# Patient Record
Sex: Male | Born: 1940 | Race: White | Hispanic: No | Marital: Married | Smoking: Never smoker
Health system: Southern US, Community
[De-identification: ages and names within clinical notes are randomized; demographics above are authoritative.]

## PROBLEM LIST (undated history)

## (undated) DIAGNOSIS — N400 Enlarged prostate without lower urinary tract symptoms: Secondary | ICD-10-CM

## (undated) DIAGNOSIS — M5126 Other intervertebral disc displacement, lumbar region: Secondary | ICD-10-CM

## (undated) DIAGNOSIS — G238 Other specified degenerative diseases of basal ganglia: Secondary | ICD-10-CM

## (undated) DIAGNOSIS — E785 Hyperlipidemia, unspecified: Secondary | ICD-10-CM

## (undated) DIAGNOSIS — K219 Gastro-esophageal reflux disease without esophagitis: Secondary | ICD-10-CM

## (undated) DIAGNOSIS — I10 Essential (primary) hypertension: Secondary | ICD-10-CM

## (undated) DIAGNOSIS — G903 Multi-system degeneration of the autonomic nervous system: Secondary | ICD-10-CM

## (undated) DIAGNOSIS — E039 Hypothyroidism, unspecified: Secondary | ICD-10-CM

## (undated) DIAGNOSIS — K5790 Diverticulosis of intestine, part unspecified, without perforation or abscess without bleeding: Secondary | ICD-10-CM

## (undated) DIAGNOSIS — K449 Diaphragmatic hernia without obstruction or gangrene: Secondary | ICD-10-CM

## (undated) HISTORY — DX: Other intervertebral disc displacement, lumbar region: M51.26

## (undated) HISTORY — DX: Gastro-esophageal reflux disease without esophagitis: K21.9

## (undated) HISTORY — DX: Hypothyroidism, unspecified: E03.9

## (undated) HISTORY — DX: Multi-system degeneration of the autonomic nervous system: G90.3

## (undated) HISTORY — DX: Other specified degenerative diseases of basal ganglia: G23.8

## (undated) HISTORY — DX: Hyperlipidemia, unspecified: E78.5

## (undated) HISTORY — DX: Benign prostatic hyperplasia without lower urinary tract symptoms: N40.0

## (undated) HISTORY — DX: Diaphragmatic hernia without obstruction or gangrene: K44.9

## (undated) HISTORY — DX: Diverticulosis of intestine, part unspecified, without perforation or abscess without bleeding: K57.90

## (undated) HISTORY — DX: Essential (primary) hypertension: I10

---
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
## 1998-12-17 ENCOUNTER — Encounter: Admission: RE | Admit: 1998-12-17 | Discharge: 1999-03-17 | Payer: Self-pay | Admitting: Endocrinology

## 2002-03-24 ENCOUNTER — Ambulatory Visit (HOSPITAL_COMMUNITY): Admission: RE | Admit: 2002-03-24 | Discharge: 2002-03-24 | Payer: Self-pay | Admitting: *Deleted

## 2004-02-05 ENCOUNTER — Encounter: Admission: RE | Admit: 2004-02-05 | Discharge: 2004-02-05 | Payer: Self-pay | Admitting: Internal Medicine

## 2007-06-20 HISTORY — PX: KNEE SURGERY: SHX244

## 2007-09-01 ENCOUNTER — Encounter: Admission: RE | Admit: 2007-09-01 | Discharge: 2007-09-01 | Payer: Self-pay | Admitting: Internal Medicine

## 2008-02-28 ENCOUNTER — Encounter: Admission: RE | Admit: 2008-02-28 | Discharge: 2008-02-28 | Payer: Self-pay | Admitting: Internal Medicine

## 2008-03-05 ENCOUNTER — Encounter: Admission: RE | Admit: 2008-03-05 | Discharge: 2008-03-05 | Payer: Self-pay | Admitting: Internal Medicine

## 2008-06-19 HISTORY — PX: SHOULDER SURGERY: SHX246

## 2009-10-21 ENCOUNTER — Encounter: Admission: RE | Admit: 2009-10-21 | Discharge: 2009-10-21 | Payer: Self-pay | Admitting: Orthopedic Surgery

## 2010-07-11 ENCOUNTER — Encounter: Payer: Self-pay | Admitting: Internal Medicine

## 2010-11-04 NOTE — Op Note (Signed)
   NAME:  Justin, Gomez                         ACCOUNT NO.:  [id number]   MEDICAL RECORD NO.:  [id number]                   PATIENT TYPE:  AMB   LOCATION:  ENDO                                 FACILITY:  [facility]   PHYSICIAN:  Georgiana Spinner, M.D.                 DATE OF BIRTH:  01/27/1941   DATE OF PROCEDURE:  DATE OF DISCHARGE:                                 OPERATIVE REPORT   PROCEDURE:  Colonoscopy.   INDICATIONS FOR PROCEDURE:  Hemoccult positivity, colon cancer screening.   ANESTHESIA:  Demerol 80, Versed 8.5 mg.   DESCRIPTION OF PROCEDURE:  With the patient mildly sedated in the left  lateral decubitus position, a rectal examination was performed which  revealed trace positive material, the prostate was not felt. From this  point, the colonoscope was inserted in the rectum and passed under direct  vision to the cecum identified by the ileocecal valve and appendiceal  orifice both of which were photographed. From this point, the colonoscope  was slowly withdrawn taking circumferential views of the entire colonic  mucosa stopping to photograph diverticulosis seen along the way in the  sigmoid colon until we reached the rectum which appeared normal on direct  and retroflexed view. The endoscope was straightened and withdrawn. The  patient's vital signs and pulse oximeter remained stable. The patient  tolerated the procedure well without apparent complications.   FINDINGS:  Diverticulosis of sigmoid colon, otherwise, unremarkable exam.   PLAN:  Possibly repeat examination in about five years.                                                 Georgiana Spinner, M.D.    GMO/MEDQ  D:  03/24/2002  T:  03/24/2002  Job:  [id number]

## 2011-04-21 ENCOUNTER — Other Ambulatory Visit: Payer: Self-pay | Admitting: Internal Medicine

## 2011-04-21 DIAGNOSIS — R1310 Dysphagia, unspecified: Secondary | ICD-10-CM

## 2011-04-26 ENCOUNTER — Other Ambulatory Visit: Payer: Self-pay

## 2011-05-04 ENCOUNTER — Ambulatory Visit
Admission: RE | Admit: 2011-05-04 | Discharge: 2011-05-04 | Disposition: A | Discharge status: Home / Self Care | Payer: No Typology Code available for payment source | Source: Ambulatory Visit | Attending: Internal Medicine | Admitting: Internal Medicine

## 2011-05-04 DIAGNOSIS — R1310 Dysphagia, unspecified: Secondary | ICD-10-CM

## 2011-06-28 ENCOUNTER — Encounter: Payer: Self-pay | Admitting: Gastroenterology

## 2011-07-11 ENCOUNTER — Ambulatory Visit (INDEPENDENT_AMBULATORY_CARE_PROVIDER_SITE_OTHER): Payer: No Typology Code available for payment source | Admitting: Gastroenterology

## 2011-07-11 ENCOUNTER — Encounter: Payer: Self-pay | Admitting: Gastroenterology

## 2011-07-11 VITALS — BP 120/62 | HR 76 | Ht 72.0 in | Wt 215.0 lb

## 2011-07-11 DIAGNOSIS — R1319 Other dysphagia: Secondary | ICD-10-CM

## 2011-07-11 DIAGNOSIS — R933 Abnormal findings on diagnostic imaging of other parts of digestive tract: Secondary | ICD-10-CM

## 2011-07-11 NOTE — Patient Instructions (Signed)
You have been scheduled for a Upper Endoscopy with possible dilitation. See separate instructions. cc: Juline Patch, MD

## 2011-07-11 NOTE — Progress Notes (Signed)
History of Present Illness: This is a 71 year old male with a six-month history of intermittent solid food dysphagia and regurgitation. His symptoms have not progressed in severity. He underwent an upper GI series on 05/04/2011 showing a small sliding hiatal hernia as well as anterior osteophytes compressing the esophagus at the T1-T2 level. Denies weight loss, abdominal pain, constipation, diarrhea, change in stool caliber, melena, hematochezia, nausea, vomiting, chest pain.  No Known Allergies Outpatient Prescriptions Prior to Visit  Medication Sig Dispense Refill  . aspirin 81 MG tablet Take 81 mg by mouth daily.       Marland Kitchen dutasteride (AVODART) 0.5 MG capsule Take 0.5 mg by mouth daily.      Marland Kitchen levothyroxine (SYNTHROID, LEVOTHROID) 75 MCG tablet Take 75 mcg by mouth daily.      Marland Kitchen lisinopril (PRINIVIL,ZESTRIL) 10 MG tablet Take 10 mg by mouth daily.      . Saxagliptin-Metformin (KOMBIGLYZE XR) 5-500 MG TB24 Take 1 tablet by mouth 2 (two) times daily.        Past Medical History  Diagnosis Date  . Diabetes mellitus   . Hyperlipidemia   . Hypertension   . BPH (benign prostatic hypertrophy)   . Hypothyroidism   . Lumbar herniated disc     L4  . Diverticulosis   . Hiatal hernia   . GERD (gastroesophageal reflux disease)    Past Surgical History  Procedure Date  . Appendectomy 1956  . Knee surgery 2009    right  . Shoulder surgery 2010    left   History   Social History  . Marital Status: Married    Spouse Name: N/A    Number of Children: 3  . Years of Education: N/A   Occupational History  . business executive    Social History Main Topics  . Smoking status: Never Smoker   . Smokeless tobacco: Never Used  . Alcohol Use: Yes     Rarely  . Drug Use: No  . Sexually Active: None   Other Topics Concern  . None   Social History Narrative  . None   Family History  Problem Relation Age of Onset  . Cerebral aneurysm Father   . Hypertension Father   . Diabetes Mother     . Hypertension Brother   . Diabetes Paternal Grandmother     Review of Systems: Pertinent positive and negative review of systems were noted in the above HPI section. All other review of systems were otherwise negative.  Physical Exam: General: Well developed , well nourished, no acute distress Head: Normocephalic and atraumatic Eyes:  sclerae anicteric, EOMI Ears: Normal auditory acuity Mouth: No deformity or lesions Neck: Supple, no masses or thyromegaly Lungs: Clear throughout to auscultation Heart: Regular rate and rhythm; no murmurs, rubs or bruits Abdomen: Soft, non tender and non distended. Diastases rectus. No masses, hepatosplenomegaly or hernias noted. Normal Bowel sounds Musculoskeletal: Symmetrical with no gross deformities  Skin: No lesions on visible extremities Pulses:  Normal pulses noted Extremities: No clubbing, cyanosis, edema or deformities noted Neurological: Alert oriented x 4, grossly nonfocal Cervical Nodes:  No significant cervical adenopathy Inguinal Nodes: No significant inguinal adenopathy Psychological:  Alert and cooperative. Normal mood and affect  Assessment and Recommendations:  1. Dysphagia. Abnormal UGI series. His dysphagia may be secondary to osteophytes compressing the upper esophagus. Rule out erosive esophagitis and acid reflux contributing to symptoms. The risks, benefits, and alternatives to endoscopy with possible biopsy and possible dilation were discussed with the patient and they  consent to proceed. Consider a trial of a PPI depending on findings at upper endoscopy.  2. Colorectal cancer screening. Colonoscopy recommended October 2013.

## 2011-07-18 ENCOUNTER — Encounter: Payer: Self-pay | Admitting: Gastroenterology

## 2011-07-18 ENCOUNTER — Ambulatory Visit (AMBULATORY_SURGERY_CENTER): Payer: No Typology Code available for payment source | Admitting: Gastroenterology

## 2011-07-18 DIAGNOSIS — R933 Abnormal findings on diagnostic imaging of other parts of digestive tract: Secondary | ICD-10-CM

## 2011-07-18 DIAGNOSIS — R1319 Other dysphagia: Secondary | ICD-10-CM

## 2011-07-18 MED ORDER — SODIUM CHLORIDE 0.9 % IV SOLN: 500.0000 mL | INTRAVENOUS | Status: DC

## 2011-07-18 NOTE — Progress Notes (Signed)
Patient did not experience any of the following events: a burn prior to discharge; a fall within the facility; wrong site/side/patient/procedure/implant event; or a hospital transfer or hospital admission upon discharge from the facility. (G8907) Patient did not have preoperative order for IV antibiotic SSI prophylaxis. (G8918)  

## 2011-07-18 NOTE — Op Note (Signed)
[facility] [address] [place], [place]  [address]  ENDOSCOPY PROCEDURE REPORT  PATIENT:  Justin Gomez, Justin Gomez  MR#:  [id number] BIRTHDATE:  [birth year], 70 yrs. old  GENDER:  male ENDOSCOPIST:  Judie Petit T. Russella Dar, MD, [facility] Referred by:  Juline Patch, M.D. PROCEDURE DATE:  07/18/2011 PROCEDURE:  EGD with dilatation over guidewire ASA CLASS:  Class II INDICATIONS:  1) dysphagia  2) abnormal GI imaging MEDICATIONS:   These medications were titrated to patient response per physician's verbal order, Fentanyl 50 mcg IV, Versed 6 mg IV TOPICAL ANESTHETIC:  Cetacaine Spray DESCRIPTION OF PROCEDURE:   After the risks benefits and alternatives of the procedure were thoroughly explained, informed consent was obtained.  The LB-GIF-H180 [id number] endoscope was introduced through the mouth and advanced to the second portion of the duodenum, without limitations.  The instrument was slowly withdrawn as the mucosa was carefully examined. <<PROCEDUREIMAGES>> The esophagus and gastroesophageal junction were completely normal in appearance.  The stomach was entered and closely examined. The pylorus, antrum, angularis, and lesser curvature were well visualized, including a retroflexed view of the cardia and fundus. The stomach wall was normally distensable. The scope passed easily through the pylorus into the duodenum.  The duodenal bulb was normal in appearance, as was the postbulbar duodenum. Dilation was then performed at the total esophagus for dysphagia without a stricture:  Dilator:  Savary over guidewire  Size:  17 mm Resistance:  none  Heme:  none Appearance:  COMPLICATIONS:  None  ENDOSCOPIC IMPRESSION: 1) Normal EGD  RECOMMENDATIONS: 1) post dilation instructions 2) consider PPI qam if dysphagia persists but spine osteophytes are the likely cause  Malcolm T. Russella Dar, MD, [facility]  n. eSIGNED:   Venita Lick. Stark at 07/18/2011 11:48 AM  Maryclare Bean, [phone]

## 2011-07-19 ENCOUNTER — Telehealth: Payer: Self-pay | Admitting: *Deleted

## 2011-07-19 NOTE — Telephone Encounter (Signed)
  Follow up Call-  Call back number 07/18/2011  Post procedure Call Back phone  # [phone]  Permission to leave phone message Yes     Patient questions:  Do you have a fever, pain , or abdominal swelling? no Pain Score  0 *  Have you tolerated food without any problems? yes  Have you been able to return to your normal activities? yes  Do you have any questions about your discharge instructions: Diet   no Medications  no Follow up visit  no  Do you have questions or concerns about your Care? no  Actions: * If pain score is 4 or above:

## 2012-04-09 ENCOUNTER — Encounter: Payer: Self-pay | Admitting: Gastroenterology

## 2012-05-03 ENCOUNTER — Encounter: Payer: Self-pay | Admitting: Gastroenterology

## 2012-05-24 ENCOUNTER — Encounter: Payer: Medicare (Managed Care) | Admitting: Gastroenterology

## 2012-06-17 ENCOUNTER — Ambulatory Visit (AMBULATORY_SURGERY_CENTER): Payer: Medicare PPO | Admitting: *Deleted

## 2012-06-17 ENCOUNTER — Encounter: Payer: Self-pay | Admitting: Gastroenterology

## 2012-06-17 VITALS — Ht 72.0 in | Wt 203.0 lb

## 2012-06-17 DIAGNOSIS — Z1211 Encounter for screening for malignant neoplasm of colon: Secondary | ICD-10-CM

## 2012-06-17 MED ORDER — MOVIPREP 100 G PO SOLR: ORAL | Status: DC

## 2012-07-01 ENCOUNTER — Telehealth: Payer: Self-pay | Admitting: Gastroenterology

## 2012-07-01 NOTE — Telephone Encounter (Signed)
Pt. Stated that he had bowl of cereal this am. Advised to stop all solid food and to ingest only clear liquids today.  Reminded to follow actual prep Instructions implicitly. Pt. Verbalized understanding.

## 2012-07-02 ENCOUNTER — Encounter: Payer: Self-pay | Admitting: Gastroenterology

## 2012-07-02 ENCOUNTER — Ambulatory Visit (AMBULATORY_SURGERY_CENTER): Payer: Medicare Other | Admitting: Gastroenterology

## 2012-07-02 VITALS — BP 147/83 | HR 67 | Temp 96.9°F | Resp 48 | Ht 72.0 in | Wt 203.0 lb

## 2012-07-02 DIAGNOSIS — Z1211 Encounter for screening for malignant neoplasm of colon: Secondary | ICD-10-CM

## 2012-07-02 MED ORDER — SODIUM CHLORIDE 0.9 % IV SOLN: 500.0000 mL | INTRAVENOUS | Status: DC

## 2012-07-02 NOTE — Progress Notes (Signed)
Patient did not experience any of the following events: a burn prior to discharge; a fall within the facility; wrong site/side/patient/procedure/implant event; or a hospital transfer or hospital admission upon discharge from the facility. (G8907) Patient did not have preoperative order for IV antibiotic SSI prophylaxis. (G8918)  

## 2012-07-02 NOTE — Progress Notes (Signed)
No egg or soy allergy. ewm 

## 2012-07-02 NOTE — Patient Instructions (Addendum)
Handouts were given to your care partner on diverticulosis and high fiber diet.  You may resume your current medications today.  Please call if any questions or concerns. Your blood sugar was 132 in the recovery room.    YOU HAD AN ENDOSCOPIC PROCEDURE TODAY AT THE [facility]: Refer to the procedure report that was given to you for any specific questions about what was found during the examination.  If the procedure report does not answer your questions, please call your gastroenterologist to clarify.  If you requested that your care partner not be given the details of your procedure findings, then the procedure report has been included in a sealed envelope for you to review at your convenience later.  YOU SHOULD EXPECT: Some feelings of bloating in the abdomen. Passage of more gas than usual.  Walking can help get rid of the air that was put into your GI tract during the procedure and reduce the bloating. If you had a lower endoscopy (such as a colonoscopy or flexible sigmoidoscopy) you may notice spotting of blood in your stool or on the toilet paper. If you underwent a bowel prep for your procedure, then you may not have a normal bowel movement for a few days.  DIET: Your first meal following the procedure should be a light meal and then it is ok to progress to your normal diet.  A half-sandwich or bowl of soup is an example of a good first meal.  Heavy or fried foods are harder to digest and may make you feel nauseous or bloated.  Likewise meals heavy in dairy and vegetables can cause extra gas to form and this can also increase the bloating.  Drink plenty of fluids but you should avoid alcoholic beverages for 24 hours.  ACTIVITY: Your care partner should take you home directly after the procedure.  You should plan to take it easy, moving slowly for the rest of the day.  You can resume normal activity the day after the procedure however you should NOT DRIVE or use heavy machinery for 24  hours (because of the sedation medicines used during the test).    SYMPTOMS TO REPORT IMMEDIATELY: A gastroenterologist can be reached at any hour.  During normal business hours, 8:30 AM to 5:00 PM Monday through Friday, call [phone].  After hours and on weekends, please call the GI answering service at [phone] who will take a message and have the physician on call contact you.   Following lower endoscopy (colonoscopy or flexible sigmoidoscopy):  Excessive amounts of blood in the stool  Significant tenderness or worsening of abdominal pains  Swelling of the abdomen that is new, acute  Fever of 100F or higher   FOLLOW UP: If any biopsies were taken you will be contacted by phone or by letter within the next 1-3 weeks.  Call your gastroenterologist if you have not heard about the biopsies in 3 weeks.  Our staff will call the home number listed on your records the next business day following your procedure to check on you and address any questions or concerns that you may have at that time regarding the information given to you following your procedure. This is a courtesy call and so if there is no answer at the home number and we have not heard from you through the emergency physician on call, we will assume that you have returned to your regular daily activities without incident.  SIGNATURES/CONFIDENTIALITY: You and/or your care partner have  signed paperwork which will be entered into your electronic medical record.  These signatures attest to the fact that that the information above on your After Visit Summary has been reviewed and is understood.  Full responsibility of the confidentiality of this discharge information lies with you and/or your care-partner.

## 2012-07-02 NOTE — Op Note (Signed)
[facility] [address] [place], [address]   COLONOSCOPY PROCEDURE REPORT  PATIENT: Justin Gomez, Justin Gomez  MR#: [id number] BIRTHDATE: 1940/12/03 , 71  yrs. old GENDER: Male ENDOSCOPIST: Meryl Dare, MD, [facility] REFERRED AO:[id number] Ricki Miller, M.D. PROCEDURE DATE:  07/02/2012 PROCEDURE:   Colonoscopy, screening ASA CLASS:   Class II INDICATIONS:average risk screening. MEDICATIONS: MAC sedation, administered by CRNA and propofol (Diprivan) 350mg  IV DESCRIPTION OF PROCEDURE:   After the risks benefits and alternatives of the procedure were thoroughly explained, informed consent was obtained.  A digital rectal exam revealed no abnormalities of the rectum.   The LB CF-H180AL [id number]  endoscope was introduced through the anus and advanced to the cecum, which was identified by both the appendix and ileocecal valve. No adverse events experienced.   The quality of the prep was adequate, using MoviPrep  The instrument was then slowly withdrawn as the colon was fully examined.  COLON FINDINGS: Moderate diverticulosis was noted in the sigmoid colon and descending colon.   The colon was otherwise normal. There was no diverticulosis, inflammation, polyps or cancers unless previously stated.  Retroflexed views revealed no abnormalities. The time to cecum=4 minutes 49 seconds.  Withdrawal time=10 minutes 05 seconds.  The scope was withdrawn and the procedure completed. COMPLICATIONS: There were no complications.  ENDOSCOPIC IMPRESSION: 1.   Moderate diverticulosis was noted in the sigmoid colon and descending colon 2.   The colon was otherwise normal  RECOMMENDATIONS: 1.  High fiber diet with liberal fluid intake. 2.  Given your age, you will not need another colonoscopy for colon cancer screening or polyp surveillance.  These types of tests usually stop around the age 71.   eSigned:  Meryl Dare, MD, [facility] 07/02/2012 9:54 AM

## 2012-07-02 NOTE — Progress Notes (Signed)
No complaints noted in the recovery room. Maw   

## 2012-07-03 ENCOUNTER — Telehealth: Payer: Self-pay | Admitting: *Deleted

## 2012-07-03 NOTE — Telephone Encounter (Signed)
  Follow up Call-  Call back number 07/02/2012 07/18/2011  Post procedure Call Back phone  # [phone]  Permission to leave phone message Yes Yes     Patient questions:  Do you have a fever, pain , or abdominal swelling? no Pain Score  0 *  Have you tolerated food without any problems? yes  Have you been able to return to your normal activities? yes  Do you have any questions about your discharge instructions: Diet   no Medications  no Follow up visit  no  Do you have questions or concerns about your Care? no  Actions: * If pain score is 4 or above: No action needed, pain <4.

## 2015-08-09 DIAGNOSIS — I10 Essential (primary) hypertension: Secondary | ICD-10-CM | POA: Diagnosis not present

## 2015-08-09 DIAGNOSIS — E1165 Type 2 diabetes mellitus with hyperglycemia: Secondary | ICD-10-CM | POA: Diagnosis not present

## 2015-08-18 DIAGNOSIS — I159 Secondary hypertension, unspecified: Secondary | ICD-10-CM | POA: Diagnosis not present

## 2015-08-18 DIAGNOSIS — E119 Type 2 diabetes mellitus without complications: Secondary | ICD-10-CM | POA: Diagnosis not present

## 2015-08-18 DIAGNOSIS — E039 Hypothyroidism, unspecified: Secondary | ICD-10-CM | POA: Diagnosis not present

## 2015-08-18 DIAGNOSIS — Z1389 Encounter for screening for other disorder: Secondary | ICD-10-CM | POA: Diagnosis not present

## 2015-08-18 DIAGNOSIS — K219 Gastro-esophageal reflux disease without esophagitis: Secondary | ICD-10-CM | POA: Diagnosis not present

## 2015-08-26 DIAGNOSIS — L570 Actinic keratosis: Secondary | ICD-10-CM | POA: Diagnosis not present

## 2015-08-26 DIAGNOSIS — L821 Other seborrheic keratosis: Secondary | ICD-10-CM | POA: Diagnosis not present

## 2015-08-26 DIAGNOSIS — L718 Other rosacea: Secondary | ICD-10-CM | POA: Diagnosis not present

## 2015-09-23 DIAGNOSIS — R05 Cough: Secondary | ICD-10-CM | POA: Diagnosis not present

## 2015-09-23 DIAGNOSIS — J029 Acute pharyngitis, unspecified: Secondary | ICD-10-CM | POA: Diagnosis not present

## 2015-09-23 DIAGNOSIS — J069 Acute upper respiratory infection, unspecified: Secondary | ICD-10-CM | POA: Diagnosis not present

## 2015-09-30 DIAGNOSIS — J069 Acute upper respiratory infection, unspecified: Secondary | ICD-10-CM | POA: Diagnosis not present

## 2015-11-19 DIAGNOSIS — I159 Secondary hypertension, unspecified: Secondary | ICD-10-CM | POA: Diagnosis not present

## 2015-11-19 DIAGNOSIS — E119 Type 2 diabetes mellitus without complications: Secondary | ICD-10-CM | POA: Diagnosis not present

## 2015-11-25 DIAGNOSIS — E1165 Type 2 diabetes mellitus with hyperglycemia: Secondary | ICD-10-CM | POA: Diagnosis not present

## 2015-11-25 DIAGNOSIS — I10 Essential (primary) hypertension: Secondary | ICD-10-CM | POA: Diagnosis not present

## 2015-11-25 DIAGNOSIS — E039 Hypothyroidism, unspecified: Secondary | ICD-10-CM | POA: Diagnosis not present

## 2015-11-25 DIAGNOSIS — M21371 Foot drop, right foot: Secondary | ICD-10-CM | POA: Diagnosis not present

## 2015-11-29 ENCOUNTER — Encounter: Payer: Self-pay | Admitting: Neurology

## 2015-11-29 ENCOUNTER — Ambulatory Visit (INDEPENDENT_AMBULATORY_CARE_PROVIDER_SITE_OTHER): Payer: PPO | Admitting: Neurology

## 2015-11-29 VITALS — BP 119/68 | HR 75 | Ht 72.0 in | Wt 182.2 lb

## 2015-11-29 DIAGNOSIS — I10 Essential (primary) hypertension: Secondary | ICD-10-CM | POA: Insufficient documentation

## 2015-11-29 DIAGNOSIS — E119 Type 2 diabetes mellitus without complications: Secondary | ICD-10-CM | POA: Diagnosis not present

## 2015-11-29 DIAGNOSIS — M21371 Foot drop, right foot: Secondary | ICD-10-CM | POA: Diagnosis not present

## 2015-11-29 HISTORY — DX: Type 2 diabetes mellitus without complications: E11.9

## 2015-11-29 HISTORY — DX: Essential (primary) hypertension: I10

## 2015-11-29 NOTE — Progress Notes (Signed)
NEUROLOGY CLINIC NEW PATIENT NOTE  NAME: [name] DOB: 1941/06/07 REFERRING PHYSICIAN: Tommy Medal, MD  I saw Justin Gomez as a new consult in the neurovascular clinic today regarding  Chief Complaint  Patient presents with  . Referral    Referral from  Jani Gravel MD for right foor drop for one month, pt able to walk, but can move mild movement  .  HPI: Justin Gomez is a 75 y.o. male with PMH of HTN and DM who presents as a new patient for right foot drop.   Patient stated that about 3 weeks ago, he noticed right foot drop, not able to dorsiflex his right foot. He has to result his right leg for walking. Denies any pain, no ankle sprain, felt some numbness tingling at the bottom right foot. No numbness at right leg, no back pain. Went to see PCP Dr. Maudie Mercury on 11/19/2015, and referred to neurology for further evaluation.  He denies any headache, facial droop, arm weakness, speech difficulty or visual changes. He denies any compression on the right foot or leg, recent surgery, or recent dramatic weight loss. He does admit that he frequently cross his legs.   Her history hypertension and diabetes, however, there very well controlled. Blood pressure today 109/68 and his recent A1c 6.2. He denies any cigarette smoking, or illicit drugs. He stated that he drinks 2 glass of wine every week.  Past Medical History  Diagnosis Date  . Diabetes mellitus   . Hyperlipidemia   . Hypertension   . BPH (benign prostatic hypertrophy)   . Hypothyroidism   . Lumbar herniated disc     L4  . Diverticulosis   . Hiatal hernia    Past Surgical History  Procedure Laterality Date  . Appendectomy  1956  . Knee surgery  2009    right  . Shoulder surgery  2010    left   Family History  Problem Relation Age of Onset  . Cerebral aneurysm Father   . Hypertension Father   . Diabetes Mother   . Hypertension Brother   . Diabetes Paternal Grandmother   . Colon cancer Neg Hx    Current  Outpatient Prescriptions  Medication Sig Dispense Refill  . aspirin 81 MG tablet Take 81 mg by mouth daily.     Marland Kitchen glipiZIDE (GLUCOTROL) 5 MG tablet     . JANUMET XR [phone] MG TB24     . levothyroxine (SYNTHROID, LEVOTHROID) 75 MCG tablet Take 75 mcg by mouth daily.    Marland Kitchen lisinopril (PRINIVIL,ZESTRIL) 10 MG tablet Take 10 mg by mouth daily.    . metFORMIN (GLUCOPHAGE-XR) 750 MG 24 hr tablet     . mirabegron ER (MYRBETRIQ) 25 MG TB24 tablet Take 25 mg by mouth daily.    Marland Kitchen omeprazole (PRILOSEC) 20 MG capsule      No current facility-administered medications for this visit.   No Known Allergies Social History   Social History  . Marital Status: Married    Spouse Name: N/A  . Number of Children: 3  . Years of Education: N/A   Occupational History  . business executive    Social History Main Topics  . Smoking status: Never Smoker   . Smokeless tobacco: Never Used  . Alcohol Use: 1.8 oz/week    2 Standard drinks or equivalent, 1 Glasses of wine per week     Comment: Rarely  . Drug Use: No  . Sexual Activity: Not on file  Other Topics Concern  . Not on file   Social History Narrative    Review of Systems Full 14 system review of systems performed and notable only for those listed, all others are neg:  Constitutional:   Cardiovascular:  Ear/Nose/Throat:   Skin:  Eyes:   Respiratory:   Gastroitestinal:   Genitourinary:  Hematology/Lymphatic:  Easy bleeding Endocrine:  Musculoskeletal:   Allergy/Immunology:   Neurological:  Numbness Psychiatric:  Sleep:    Physical Exam  Filed Vitals:   11/29/15 1107  BP: 119/68  Pulse: 75    General - Well nourished, well developed, in no apparent distress.  Ophthalmologic - Sharp disc margins OU.  Cardiovascular - Regular rate and rhythm.   Neck - supple, no nuchal rigidity .  Mental Status -  Level of arousal and orientation to time, place, and person were intact. Language including expression, naming,  repetition, comprehension, reading, and writing was assessed and found intact. Fund of Knowledge was assessed and was intact.  Cranial Nerves II - XII - II - Visual field intact OU. III, IV, VI - Extraocular movements intact. V - Facial sensation intact bilaterally. VII - Facial movement intact bilaterally. VIII - Hearing & vestibular intact bilaterally. X - Palate elevates symmetrically. XI - Chin turning & shoulder shrug intact bilaterally. XII - Tongue protrusion intact.  Motor Strength - The patient's strength was normal in all extremities Except 0/5 right foot dorsiflexion and 4/5 right foot eversion and pronator drift was absent.  Bulk was normal and fasciculations were absent.   Motor Tone - Muscle tone was assessed at the neck and appendages and was normal.  Reflexes - The patient's reflexes were normal in all extremities and he had no pathological reflexes.  Sensory - Light touch, temperature/pinprick were assessed and were normal except decreased light touch and pinprick at proximal of right big toe.    Coordination - The patient had normal movements in the hands and feet with no ataxia or dysmetria.  Tremor was absent.  Gait and Station - right steppage gait.   Imaging  None   Lab Review none   Assessment and Plan:   In summary, Justin Gomez is a 75 y.o. male with PMH of HTN and DM presents right foot drop. Exam showed right foot 0/5 dorsiflex and 4/5 eversion with right proximal big toe decreased sensation, consistent with right common peritoneal N. Injury. Only risk factor so far is frequent leg crossing. Will do EMG/NCS, refer to foot brace and PT/OT. Avoid leg crossing.   - will have nerve conduction study for location and prognosis - avoid cross legs - will refer to outpatient PT/OT for therapy and right foot brace - check BP and glucose at home - follow up with Dr. Maudie Mercury as scheduled - avoid ankle sprain or fall - follow up in 2 months.  Thank you very  much for the opportunity to participate in the care of this patient.  Please do not hesitate to call if any questions or concerns arise.  Orders Placed This Encounter  Procedures  . Ambulatory referral to Physical Therapy    Referral Priority:  Urgent    Referral Type:  Physical Medicine    Referral Reason:  Specialty Services Required    Requested Specialty:  Physical Therapy    Number of Visits Requested:  1  . Ambulatory referral to Occupational Therapy    Referral Priority:  Routine    Referral Type:  Occupational Therapy    Referral Reason:  Specialty Services Required    Requested Specialty:  Occupational Therapy    Number of Visits Requested:  1  . NCV with EMG(electromyography)    Standing Status: Future     Number of Occurrences:      Standing Expiration Date: 11/28/2016    Scheduling Instructions:     Please schedule within one week. Thanks.    Order Specific Question:  Where should this test be performed?    Answer:  [facility]    Meds ordered this encounter  Medications  . glipiZIDE (GLUCOTROL) 5 MG tablet    Sig:   . omeprazole (PRILOSEC) 20 MG capsule    Sig:   . JANUMET XR [phone] MG TB24    Sig:   . metFORMIN (GLUCOPHAGE-XR) 750 MG 24 hr tablet    Sig:   . mirabegron ER (MYRBETRIQ) 25 MG TB24 tablet    Sig: Take 25 mg by mouth daily.    Patient Instructions  - will have nerve conduction study for location and prognosis - avoid cross legs - will refer to outpatient PT/OT for therapy and right foot brace - check BP and glucose at home - follow up with Dr. Maudie Mercury as scheduled - avoid ankle sprain or fall - follow up in 2 months.   Rosalin Hawking, MD PhD [facility] [address] [place] [employer] [address] [phone]

## 2015-11-29 NOTE — Patient Instructions (Signed)
-   will have nerve conduction study for location and prognosis - avoid cross legs - will refer to outpatient PT/OT for therapy and right foot brace - check BP and glucose at home - follow up with Dr. Maudie Mercury as scheduled - avoid ankle sprain or fall - follow up in 2 months.

## 2015-12-02 ENCOUNTER — Ambulatory Visit: Payer: PPO | Attending: Neurology | Admitting: Physical Therapy

## 2015-12-02 DIAGNOSIS — M6281 Muscle weakness (generalized): Secondary | ICD-10-CM | POA: Insufficient documentation

## 2015-12-02 DIAGNOSIS — R2689 Other abnormalities of gait and mobility: Secondary | ICD-10-CM

## 2015-12-02 NOTE — Patient Instructions (Signed)
ANKLE: Dorsiflexion - Sitting    Sitting, place strap around foot. Pull foot toward body, keeping heel on floor. Keep foot straight. Hold ___ seconds. ___ reps per set, ___ sets per day, ___ days per week  Copyright  VHI. All rights reserved.  Achilles / Gastroc, Standing    Stand, right foot behind, heel on floor and turned slightly out, leg straight, forward leg bent. Move hips forward. Hold ___ seconds. Repeat ___ times per session. Do ___ sessions per day.  Copyright  VHI. All rights reserved.  ANKLE: Dorsiflexion, Step Unilateral    Stand on step, hang one heel off back of step. Hold ___ seconds. ___ reps per set, ___ sets per day, ___ days per week Hold onto a support.  Copyright  VHI. All rights reserved.

## 2015-12-02 NOTE — Patient Instructions (Addendum)
  Gastroc stretch -sitting with towel  Standing runner's stretch  Standing gastroc step on 2" step  Pictures provided (did not show up on instructions after printing)  Stretches to be performed 30 seconds, 3 reps each leg. 1-2 times per day.

## 2015-12-02 NOTE — Therapy (Signed)
[facility] [address] [place], [place], [address] Phone: [phone]   Fax:  [phone]  Physical Therapy Evaluation  Patient Details  Name: Justin Gomez MRN: [id number] Date of Birth: 1940-09-08 Referring Provider: Rosalin Hawking  Encounter Date: 12/02/2015      PT End of Session - 12/02/15 1256    Visit Number 1   Number of Visits 9   Date for PT Re-Evaluation 01/31/16   Authorization Type [employer] every 10th visit   PT Start Time 0804   PT Stop Time 0846   PT Time Calculation (min) 42 min   Activity Tolerance Patient tolerated treatment well   Behavior During Therapy [facility] for tasks assessed/performed      Past Medical History  Diagnosis Date  . Diabetes mellitus   . Hyperlipidemia   . Hypertension   . BPH (benign prostatic hypertrophy)   . Hypothyroidism   . Lumbar herniated disc     L4  . Diverticulosis   . Hiatal hernia     Past Surgical History  Procedure Laterality Date  . Appendectomy  1956  . Knee surgery  2009    right  . Shoulder surgery  2010    left    There were no vitals filed for this visit.       Subjective Assessment - 12/02/15 0807    Subjective Pt reports that "R foot just stopped working"; this started about 2-3 weeks ago.  No changes since I've noticed it.  It's changed how I walk.  No falls.   Pertinent History Diabetes, herniated disc L4, HTN   Diagnostic tests Nerve conduction testing 12/16/15   Patient Stated Goals Would like to get movement back in my foot.   Currently in Pain? No/denies            [facility] PT Assessment - 12/02/15 0809    Assessment   Medical Diagnosis R foot drop   Referring Provider Rosalin Hawking   Onset Date/Surgical Date 11/29/15  MD visit; 2-3 weeks ago foot drop began   Precautions   Precautions Fall  R foot drop   Balance Screen   Has the patient fallen in the past 6 months No   Has the patient had a decrease in activity  level because of a fear of falling?  No   Is the patient reluctant to leave their home because of a fear of falling?  No   Home Social worker Private residence   Living Arrangements Spouse/significant other   Available Help at Discharge Family   Type of [place] None   Prior Function   Level of Independence Independent;Independent with basic ADLs;Independent with community mobility without device;Independent with household mobility without device   Observation/Other Assessments   Focus on Therapeutic Outcomes (FOTO)  Functional Intake status measure 72; ABC scale score 95.6%, Physical Fear:  69.  walking is more of an effort, more tiring   Posture/Postural Control   Posture/Postural Control Postural limitations   Postural Limitations Forward head;Rounded Shoulders;Flexed trunk   ROM / Strength   AROM / PROM / Strength AROM;PROM;Strength   AROM   Overall AROM  Deficits   AROM Assessment Site Ankle   Right/Left Ankle Right;Left   Right Ankle Dorsiflexion --  lacks 20 degrees from neutral   Left Ankle Dorsiflexion 15   PROM   Overall PROM  Deficits   PROM Assessment Site Ankle   Right/Left Ankle Right;Left   Right Ankle Dorsiflexion 10  beyond neutral   Strength   Overall Strength Deficits   Strength Assessment Site Hip;Knee;Ankle   Right/Left Hip Right;Left   Right Hip Flexion 4/5   Left Hip Flexion 4/5   Right/Left Knee Right;Left   Right Knee Flexion 5/5   Right Knee Extension 5/5   Left Knee Flexion 5/5   Left Knee Extension 5/5   Right/Left Ankle Right;Left   Right Ankle Dorsiflexion 1/5   Right Ankle Plantar Flexion 2+/5   Right Ankle Inversion 4/5   Right Ankle Eversion 1/5   Left Ankle Dorsiflexion 5/5   Left Ankle Inversion 5/5   Left Ankle Eversion 5/5   Transfers   Transfers Sit to Stand;Stand to Sit   Sit to Stand 6: Modified independent (Device/Increase  time);With upper extremity assist;From chair/3-in-1   Stand to Sit 6: Modified independent (Device/Increase time);To chair/3-in-1;With upper extremity assist   Ambulation/Gait   Ambulation/Gait Yes   Ambulation/Gait Assistance 6: Modified independent (Device/Increase time);5: Supervision   Ambulation/Gait Assistance Details Trial of foot-up brace on RLE, with improved foot clearance, still with somewhat of steppage gait pattern and R foot slap noted.   Ambulation Distance (Feet) 100 Feet  x 2   Assistive device None   Gait Pattern Step-through pattern;Decreased step length - right;Decreased step length - left;Decreased dorsiflexion - right;Right steppage;Trunk flexed   Ambulation Surface Level;Indoor   Gait velocity 9.87 sec = 3.32 ft/sec  9.82 sec = 3.34 ft/sec with R foot up brace   Gait Comments Gait slightly improved with R foot up brace (pt wearing deck shoes today and will bring in lace up shoes next visit)  Pt has one episode of R foot drag/LOB/able to regain balance   Standardized Balance Assessment   Standardized Balance Assessment Timed Up and Go Test   Timed Up and Go Test   Normal TUG (seconds) 12.49  11.81 sec with foot-up brace   High Level Balance   High Level Balance Comments Pt unable to maintain tandem stance >1 seconds; unable to maintain SLS either leg > 1 second                   Therapeutic Exercise: -Seated towel stretch for gastroc 3 x 30 sec on RLE -Standing runner's stretch RLE 3 x 30 sec each -Standing gastroc stretch on 2" step with UE support on RLE 3 x 30 seconds  Added to HEP-provided instrucitons.         PT Education - 12/02/15 1255    Education provided Yes   Education Details Initiated HEP for stretching   Person(s) Educated Patient   Methods Explanation;Demonstration;Handout   Comprehension Verbalized understanding;Returned demonstration             PT Long Term Goals - 12/02/15 1304    PT LONG TERM GOAL #1   Title  Pt will perform HEP for improved ROM, strength, balance and gait.  TARGET 01/01/16   Time 4   Period Weeks   Status New   PT LONG TERM GOAL #2   Title Pt will improve SLS each leg to at least 3 seconds for improved stair and obstacle negotiation.   Time 4   Period Weeks   Status New   PT LONG TERM GOAL #3   Title Pt will ambulate at least 500 ft, indoor/outdoor surfaces independently, no LOB for improved safety and efficiency with gait.  Time 4   Period Weeks   Status New   PT LONG TERM GOAL #4   Title Orthotic consult to be initiated, for proper fit/instruction on appropriate AFO for RLE.   Time 4   Period Weeks   Status New               Plan - 31-Dec-2015 1257    Clinical Impression Statement Pt is 75 year old male who presents to OP PT with 3 week history of R foot drop.  Pt to undergo nerve conduction velocity testing within next few weeks.  Pt presents with decreased range of motion, decreased strength, abnormality of gait, decreased balance.  Pt would benefit from skilled PT to address the above stated deficits to improve functional mobility/decrease risk of falls.   Rehab Potential Good   PT Frequency 2x / week  1x/wk for 1 week, then    PT Duration 4 weeks   PT Treatment/Interventions ADLs/Self Care Home Management;Therapeutic exercise;Therapeutic activities;Functional mobility training;Gait training;Balance training;Neuromuscular re-education;Patient/family education;Orthotic Fit/Training;Electrical Stimulation   PT Next Visit Plan Trial of AFOs with gait training; review stretches provided as part of HEP; work on ankle strengthening-tandem stance and SLS added to HEP.   Recommended Other Services orthotic consult for possible AFO   Consulted and Agree with Plan of Care Patient      Patient will benefit from skilled therapeutic intervention in order to improve the following deficits and impairments:  Abnormal gait, Decreased balance, Decreased mobility, Decreased  range of motion, Decreased strength, Difficulty walking, Improper body mechanics, Postural dysfunction  Visit Diagnosis: Muscle weakness (generalized)  Other abnormalities of gait and mobility      G-Codes - 12-31-15 1307    Functional Assessment Tool Used 1/5 strength in anterior tib, steppage gait pattern, gait velocity 3.32 ft/sec; single limb stance <1 sec on R and L; tandem stance<1 sec   Functional Limitation Mobility: Walking and moving around   Mobility: Walking and Moving Around Current Status [phone]) At least 20 percent but less than 40 percent impaired, limited or restricted   Mobility: Walking and Moving Around Goal Status [phone]) At least 1 percent but less than 20 percent impaired, limited or restricted       Problem List Patient Active Problem List   Diagnosis Date Noted  . 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 Essential hypertension 11/29/2015  . Type 2 diabetes mellitus without complication, without long-term current use of insulin ([place]) 11/29/2015    Clancey Welton W. 12-31-15, 1:09 PM  Frazier Butt., PT  [facility] [address] [place], [place], [address] Phone: [phone]   Fax:  [phone]  Name: Justin Gomez MRN: [id number] Date of Birth: 03-Jun-1941

## 2015-12-03 ENCOUNTER — Other Ambulatory Visit: Payer: Self-pay

## 2015-12-03 DIAGNOSIS — M21371 Foot drop, right foot: Secondary | ICD-10-CM

## 2015-12-06 ENCOUNTER — Encounter: Payer: Self-pay | Admitting: Physical Therapy

## 2015-12-06 ENCOUNTER — Ambulatory Visit: Payer: PPO | Admitting: Physical Therapy

## 2015-12-06 DIAGNOSIS — M6281 Muscle weakness (generalized): Secondary | ICD-10-CM | POA: Diagnosis not present

## 2015-12-06 DIAGNOSIS — R2689 Other abnormalities of gait and mobility: Secondary | ICD-10-CM

## 2015-12-06 NOTE — Patient Instructions (Signed)
ANKLE: Dorsiflexion - Sitting    Sitting, place strap around foot. Pull foot toward body, keeping heel on floor. Keep foot straight. Hold _15-30__ seconds. _3__ reps per set,  Copyright  VHI. All rights reserved.  Achilles / Gastroc, Standing    Stand, right foot behind, heel on floor and turned slightly out, leg straight, forward leg bent. Move hips forward. Hold _15-30__ seconds. Repeat _3__ times per session.   Copyright  VHI. All rights reserved.  ANKLE: Dorsiflexion, Step Unilateral    Stand on step, hang one heel off back of step. Hold _15-30__ seconds. __3_ repsCopyright  VHI. All rights reserved.

## 2015-12-06 NOTE — Therapy (Signed)
[facility] [address] [place], [place], [address] Phone: [phone]   Fax:  [phone]  Physical Therapy Treatment  Patient Details  Name: Justin Gomez MRN: [id number] Date of Birth: Apr 30, 1941 Referring Provider: Rosalin Hawking  Encounter Date: 12/06/2015      PT End of Session - 12/06/15 1623    Visit Number 2   Number of Visits 9   Date for PT Re-Evaluation 01/31/16   Authorization Type [employer] -G[employer] every 10th visit   PT Start Time 0935   PT Stop Time 1020   PT Time Calculation (min) 45 min   Activity Tolerance Patient tolerated treatment well   Behavior During Therapy [facility] for tasks assessed/performed      Past Medical History  Diagnosis Date  . Diabetes mellitus   . Hyperlipidemia   . Hypertension   . BPH (benign prostatic hypertrophy)   . Hypothyroidism   . Lumbar herniated disc     L4  . Diverticulosis   . Hiatal hernia     Past Surgical History  Procedure Laterality Date  . Appendectomy  1956  . Knee surgery  2009    right  . Shoulder surgery  2010    left    There were no vitals filed for this visit.      Subjective Assessment - 12/06/15 0937    Subjective Pt performed HEP 2x/day since last visit.   Pertinent History Diabetes, herniated disc L4, HTN   Diagnostic tests Nerve conduction testing 12/16/15   Patient Stated Goals Would like to get movement back in my foot.   Currently in Pain? No/denies                         [facility] Adult PT Treatment/Exercise - 12/06/15 0001    Ambulation/Gait   Ambulation/Gait Yes   Ambulation/Gait Assistance 6: Modified independent (Device/Increase time)   Ambulation/Gait Assistance Details Trial of (1) foot-up brace on RLE, with improved foot clearance, still with somewhat of steppage gait pattern and R foot slap noted.  (2)Trial of Ottobock  Walk On brace better foot clearance but heel pulling out of shoe and slight steppage  gait.   Ambulation Distance (Feet) 230 Feet  x5   Assistive device None   Gait Pattern Step-through pattern;Decreased step length - right;Decreased step length - left;Decreased dorsiflexion - right;Right steppage;Trunk flexed   Ambulation Surface Level;Indoor   Gait Comments (3) trial with Ottobock Reaction brace  most improvement with gait, no foot drag and better RLE swing   Exercises   Exercises Ankle  Performed HEP as written in handout given 6/15 see below for                PT Education - 12/06/15 0944    Education provided Yes   Education Details Reviewed HEP, process for orthotist consult for Right AFO.   Person(s) Educated Patient   Methods Explanation   Comprehension Verbalized understanding;Verbal cues required             PT Long Term Goals - 12/02/15 1304    PT LONG TERM GOAL #1   Title Pt will perform HEP for improved ROM, strength, balance and gait.  TARGET 01/01/16   Time 4   Period Weeks   Status New   PT LONG TERM GOAL #2   Title Pt will improve SLS each leg to at least 3 seconds for improved stair and obstacle negotiation.   Time  4   Period Weeks   Status New   PT LONG TERM GOAL #3   Title Pt will ambulate at least 500 ft, indoor/outdoor surfaces independently, no LOB for improved safety and efficiency with gait.   Time 4   Period Weeks   Status New   PT LONG TERM GOAL #4   Title Orthotic consult to be initiated, for proper fit/instruction on appropriate AFO for RLE.   Time 4   Period Weeks   Status New               Plan - 12/06/15 1623    Clinical Impression Statement Gait mechanics improved most with Right Ottobock Reaction AFO allowing good right foot clearance during swing phase vs. steppage gait.  Pt is interested in pursuing AFO option.   Rehab Potential Good   PT Frequency 2x / week  1x/wk for 1 week, then    PT Duration 4 weeks   PT Treatment/Interventions ADLs/Self Care Home Management;Therapeutic  exercise;Therapeutic activities;Functional mobility training;Gait training;Balance training;Neuromuscular re-education;Patient/family education;Orthotic Fit/Training;Electrical Stimulation   PT Next Visit Plan Trial of AFOs with gait training; work on ankle strengthening-tandem stance and SLS added to HEP.   Consulted and Agree with Plan of Care Patient      Patient will benefit from skilled therapeutic intervention in order to improve the following deficits and impairments:  Abnormal gait, Decreased balance, Decreased mobility, Decreased range of motion, Decreased strength, Difficulty walking, Improper body mechanics, Postural dysfunction  Visit Diagnosis: Muscle weakness (generalized)  Other abnormalities of gait and mobility     Problem List Patient Active Problem List   Diagnosis Date Noted  . 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 Essential hypertension 11/29/2015  . Type 2 diabetes mellitus without complication, without long-term current use of insulin ([place]) 11/29/2015    Bjorn Loser, PTA  12/06/2015, 4:28 PM [facility] [address] [place], [place], [address] Phone: [phone]   Fax:  [phone]  Name: Justin Gomez MRN: [id number] Date of Birth: [birth year]

## 2015-12-08 ENCOUNTER — Ambulatory Visit: Payer: PPO | Admitting: Physical Therapy

## 2015-12-08 DIAGNOSIS — M6281 Muscle weakness (generalized): Secondary | ICD-10-CM

## 2015-12-08 DIAGNOSIS — R2689 Other abnormalities of gait and mobility: Secondary | ICD-10-CM

## 2015-12-08 NOTE — Therapy (Signed)
[facility] [address] [place], [place], [address] Phone: [phone]   Fax:  [phone]  Physical Therapy Treatment  Patient Details  Name: Justin Gomez MRN: [id number] Date of Birth: [birth year] Referring Provider: Rosalin Hawking  Encounter Date: 12/08/2015      PT End of Session - 12/08/15 1253    Visit Number 3   Number of Visits 9   Date for PT Re-Evaluation 01/31/16   Authorization Type [employer] -G[employer] every 10th visit   PT Start Time 1147   PT Stop Time 1232   PT Time Calculation (min) 45 min   Activity Tolerance Patient tolerated treatment well   Behavior During Therapy [facility] for tasks assessed/performed      Past Medical History  Diagnosis Date  . Diabetes mellitus   . Hyperlipidemia   . Hypertension   . BPH (benign prostatic hypertrophy)   . Hypothyroidism   . Lumbar herniated disc     L4  . Diverticulosis   . Hiatal hernia     Past Surgical History  Procedure Laterality Date  . Appendectomy  1956  . Knee surgery  2009    right  . Shoulder surgery  2010    left    There were no vitals filed for this visit.      Subjective Assessment - 12/08/15 1250    Subjective "I want to wait on ordering the brace until after I have the test next week."  Pt having nerve conduction test 12/16/15   Pertinent History Diabetes, herniated disc L4, HTN   Diagnostic tests Nerve conduction testing 12/16/15   Patient Stated Goals Would like to get movement back in my foot.   Currently in Pain? No/denies            [facility] Adult PT Treatment/Exercise - 12/08/15 0001    Transfers   Transfers Sit to Stand;Stand to Sit   Sit to Stand 6: Modified independent (Device/Increase time);With upper extremity assist;From chair/3-in-1   Stand to Sit 6: Modified independent (Device/Increase time);To chair/3-in-1;With upper extremity assist   Ambulation/Gait   Ambulation/Gait Yes   Ambulation/Gait Assistance 5:  Supervision   Ambulation Distance (Feet) 330 Feet  6 plus times   Assistive device None   Gait Pattern Step-through pattern;Decreased step length - right;Decreased step length - left;Decreased dorsiflexion - right;Right steppage;Trunk flexed   Ambulation Surface Level;Indoor   Gait Comments Trialed R toe off, R Ottobock posterior, R Ottobock Reaction and R [place].  Pt initially reports feeling like R Ottobock reaction was putting too much presure on anterior tib and reported feeling like the Toe off provided the most foot clearance and comfort but on second trial pt prefered Ottobock reaction.     Ankle Exercises: Seated   Toe Raise 10 reps;Limitations   Toe Raise Limitations Unable to complete full AROM dorsiflexion or any resistance.     Other Seated Ankle Exercises R ankle eversion x 10 AROM   Ankle Exercises: Standing   SLS SLS x 10 seconds bil LE x 3 reps   Other Standing Ankle Exercises Tandem stance x 10 seconds x 3 reps each direction;tandem gait x 8' x 6 reps with UE support of counter                PT Education - 12/08/15 1251    Education provided Yes   Education Details Different types of AFO's and benefit of brace, especially with fatigue   Person(s) Educated Patient  Methods Explanation   Comprehension Verbalized understanding             PT Long Term Goals - 12/02/15 1304    PT LONG TERM GOAL #1   Title Pt will perform HEP for improved ROM, strength, balance and gait.  TARGET 01/01/16   Time 4   Period Weeks   Status New   PT LONG TERM GOAL #2   Title Pt will improve SLS each leg to at least 3 seconds for improved stair and obstacle negotiation.   Time 4   Period Weeks   Status New   PT LONG TERM GOAL #3   Title Pt will ambulate at least 500 ft, indoor/outdoor surfaces independently, no LOB for improved safety and efficiency with gait.   Time 4   Period Weeks   Status New   PT LONG TERM GOAL #4   Title Orthotic consult to be initiated, for  proper fit/instruction on appropriate AFO for RLE.   Time 4   Period Weeks   Status New               Plan - 12/08/15 1253    Clinical Impression Statement Continues with decreased foot clearance and steppage gait without use of AFO.  Pt reports feeling increased pressure/discomfort at anterior tib with Ottobock reaction and may need custom fit AFO due to diabetes as well.  PT has faxed order to [facility] and will coordinate appointment in this clinic with [facility] to discuss AFO options.  Continue PT per POC.   Rehab Potential Good   PT Frequency 2x / week  1x/wk for 1 week, then    PT Duration 4 weeks   PT Treatment/Interventions ADLs/Self Care Home Management;Therapeutic exercise;Therapeutic activities;Functional mobility training;Gait training;Balance training;Neuromuscular re-education;Patient/family education;Orthotic Fit/Training;Electrical Stimulation   PT Next Visit Plan Give pictures of SLS, tandem stance and tandem gait.  Continue gait with trial AFO's-indoor and outdoor surfaces.   Consulted and Agree with Plan of Care Patient      Patient will benefit from skilled therapeutic intervention in order to improve the following deficits and impairments:  Abnormal gait, Decreased balance, Decreased mobility, Decreased range of motion, Decreased strength, Difficulty walking, Improper body mechanics, Postural dysfunction  Visit Diagnosis: Muscle weakness (generalized)  Other abnormalities of gait and mobility     Problem List Patient Active Problem List   Diagnosis Date Noted  . 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 Essential hypertension 11/29/2015  . Type 2 diabetes mellitus without complication, without long-term current use of insulin ([place]) 11/29/2015    Narda Bonds 12/08/2015, 1:07 PM  [facility] [address] [place], [place], [address] Phone: [phone]   Fax:  [phone]  Name: KIYAN MCCLAVE MRN: [id number] Date of Birth: August 13, 1940    Narda Bonds, [place] 12/08/2015 1:07 PM Phone: [phone] Fax: [phone]

## 2015-12-09 DIAGNOSIS — I10 Essential (primary) hypertension: Secondary | ICD-10-CM | POA: Diagnosis not present

## 2015-12-09 DIAGNOSIS — E039 Hypothyroidism, unspecified: Secondary | ICD-10-CM | POA: Diagnosis not present

## 2015-12-09 DIAGNOSIS — Z0000 Encounter for general adult medical examination without abnormal findings: Secondary | ICD-10-CM | POA: Diagnosis not present

## 2015-12-13 ENCOUNTER — Ambulatory Visit: Payer: PPO | Admitting: Physical Therapy

## 2015-12-13 ENCOUNTER — Encounter: Payer: Self-pay | Admitting: Physical Therapy

## 2015-12-13 DIAGNOSIS — M6281 Muscle weakness (generalized): Secondary | ICD-10-CM | POA: Diagnosis not present

## 2015-12-13 DIAGNOSIS — R2689 Other abnormalities of gait and mobility: Secondary | ICD-10-CM

## 2015-12-13 NOTE — Therapy (Signed)
[facility] [address] [place], [place], [address] Phone: [phone]   Fax:  [phone]  Physical Therapy Treatment  Patient Details  Name: MALAKHI KIBBEY MRN: [id number] Date of Birth: 1940-10-05 Referring Provider: Rosalin Hawking  Encounter Date: 12/13/2015      PT End of Session - 12/13/15 1019    Visit Number 4   Number of Visits 9   Date for PT Re-Evaluation 01/31/16   Authorization Type [employer] every 10th visit   PT Start Time 0940   PT Stop Time 1018   PT Time Calculation (min) 38 min   Activity Tolerance Patient tolerated treatment well   Behavior During Therapy [facility] for tasks assessed/performed      Past Medical History  Diagnosis Date  . Diabetes mellitus   . Hyperlipidemia   . Hypertension   . BPH (benign prostatic hypertrophy)   . Hypothyroidism   . Lumbar herniated disc     L4  . Diverticulosis   . Hiatal hernia     Past Surgical History  Procedure Laterality Date  . Appendectomy  1956  . Knee surgery  2009    right  . Shoulder surgery  2010    left    There were no vitals filed for this visit.      Subjective Assessment - 12/13/15 0942    Subjective   Pt having nerve conduction test 12/16/15. No fall since last visit. Has DM but has not had a problem with swelling.   Pertinent History Diabetes, herniated disc L4, HTN   Diagnostic tests Nerve conduction testing 12/16/15   Patient Stated Goals Would like to get movement back in my foot.   Currently in Pain? Yes                         [facility] Adult PT Treatment/Exercise - 12/13/15 0001    Ambulation/Gait   Ambulation/Gait Yes   Ambulation/Gait Assistance 6: Modified independent (Device/Increase time)   Ambulation/Gait Assistance Details Worked with Ottobock Reaction AFO.  Pt had no complaints.  Able to have good Rt foot clearance during gait and practiced Rt Leg swing vs steppage gait.                         Ambulation Distance (Feet) 400 Feet  400'   Assistive device None   Gait Pattern Step-through pattern;Decreased stride length;Trunk flexed   Ambulation Surface Level;Unlevel;Indoor;Outdoor;Paved;Grass;Gravel   Stairs Yes   Stairs Assistance 6: Modified independent (Device/Increase time)   Stair Management Technique Alternating pattern;One rail Left;Other (comment)  Rt AFO   Number of Stairs 4  x4   Door Management 6: Modified independent (Device/Increase time)   Ramp 6: Modified independent (Device)  worked in step length and coordinated pattern with Rt AFO   Curb 6: Modified independent (Device/increase time)   Gait Comments when walking into gym pt had to self correct balance due significant right foot drag without AFO donned             Balance Exercises - 12/13/15 1009    Balance Exercises: Standing   Tandem Stance Eyes open;3 reps;30 secs;Intermittent upper extremity support  With right AFO donned   SLS Eyes open;Intermittent upper extremity support;3 reps;15 secs  right AFO donned   Tandem Gait Forward;Intermittent upper extremity support;4 reps;Other (comment)  right AFO not donned           PT Education -  12/13/15 1009    Education provided Yes   Education Details HEP for standing balance   Person(s) Educated Patient   Methods Demonstration;Verbal cues;Explanation;Handout   Comprehension Verbalized understanding;Returned demonstration;Need further instruction             PT Long Term Goals - 12/02/15 1304    PT LONG TERM GOAL #1   Title Pt will perform HEP for improved ROM, strength, balance and gait.  TARGET 01/01/16   Time 4   Period Weeks   Status New   PT LONG TERM GOAL #2   Title Pt will improve SLS each leg to at least 3 seconds for improved stair and obstacle negotiation.   Time 4   Period Weeks   Status New   PT LONG TERM GOAL #3   Title Pt will ambulate at least 500 ft, indoor/outdoor surfaces independently, no LOB for improved safety  and efficiency with gait.   Time 4   Period Weeks   Status New   PT LONG TERM GOAL #4   Title Orthotic consult to be initiated, for proper fit/instruction on appropriate AFO for RLE.   Time 4   Period Weeks   Status New               Plan - 12/13/15 1537    Clinical Impression Statement Progressing with gait menchanics using Rt Ottobock Reaction AFO on multilevel surfaces with greatly improved balance.  Balance continues to be challenged with SLS and tandem stance activities.   Rehab Potential Good   PT Frequency 2x / week  1x/wk for 1 week, then    PT Duration 4 weeks   PT Treatment/Interventions ADLs/Self Care Home Management;Therapeutic exercise;Therapeutic activities;Functional mobility training;Gait training;Balance training;Neuromuscular re-education;Patient/family education;Orthotic Fit/Training;Electrical Stimulation   PT Next Visit Plan Give pictures of SLS, tandem stance and tandem gait.  Continue gait with trial AFO's-indoor and outdoor surfaces.   Consulted and Agree with Plan of Care Patient      Patient will benefit from skilled therapeutic intervention in order to improve the following deficits and impairments:  Abnormal gait, Decreased balance, Decreased mobility, Decreased range of motion, Decreased strength, Difficulty walking, Improper body mechanics, Postural dysfunction  Visit Diagnosis: Muscle weakness (generalized)  Other abnormalities of gait and mobility     Problem List Patient Active Problem List   Diagnosis Date Noted  . 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 Essential hypertension 11/29/2015  . Type 2 diabetes mellitus without complication, without long-term current use of insulin ([place]) 11/29/2015    Bjorn Loser, PTA  12/13/2015, 3:41 PM [facility] [address] [place], [place], [address] Phone: [phone]   Fax:  [phone]  Name: VALENTINE BREAUX MRN: [id number] Date of  Birth: 05/24/1941

## 2015-12-13 NOTE — Patient Instructions (Signed)
Tandem Stance    Right foot in front of left, heel touching toe both feet "straight ahead". Stand on Foot Triangle of Support with both feet. Balance in this position __30_ seconds. Do with left foot in front of right x3 Copyright  VHI. All rights reserved.  Tandem Walking    Walk with each foot directly in front of other, heel of one foot touching toes of other foot with each step. Both feet straight ahead along the counter   Copyright  VHI. All rights reserved.  SINGLE LIMB STANCE    Stance: single leg on floor. Raise leg. Hold _15__ seconds. Repeat with other leg. _3__ reps per set, ___ sets per day Copyright  VHI. All rights reserved.

## 2015-12-15 ENCOUNTER — Ambulatory Visit: Payer: PPO | Admitting: Physical Therapy

## 2015-12-15 DIAGNOSIS — M6281 Muscle weakness (generalized): Secondary | ICD-10-CM

## 2015-12-15 DIAGNOSIS — R2689 Other abnormalities of gait and mobility: Secondary | ICD-10-CM

## 2015-12-15 NOTE — Therapy (Signed)
[facility] [address] [place], [place], [address] Phone: [phone]   Fax:  [phone]  Physical Therapy Treatment  Patient Details  Name: Justin Gomez MRN: [id number] Date of Birth: 10-22-1940 Referring Provider: Rosalin Hawking  Encounter Date: 12/15/2015      PT End of Session - 12/15/15 2214    Visit Number 5   Number of Visits 9   Date for PT Re-Evaluation 01/31/16   Authorization Type [employer] -G[employer] every 10th visit   PT Start Time 0850   PT Stop Time 0931   PT Time Calculation (min) 41 min   Equipment Utilized During Treatment Gait belt   Activity Tolerance Patient tolerated treatment well   Behavior During Therapy [facility] for tasks assessed/performed      Past Medical History  Diagnosis Date  . Diabetes mellitus   . Hyperlipidemia   . Hypertension   . BPH (benign prostatic hypertrophy)   . Hypothyroidism   . Lumbar herniated disc     L4  . Diverticulosis   . Hiatal hernia     Past Surgical History  Procedure Laterality Date  . Appendectomy  1956  . Knee surgery  2009    right  . Shoulder surgery  2010    left    There were no vitals filed for this visit.      Subjective Assessment - 12/15/15 0852    Subjective No falls, have nerve conduction study tomorrow.   Pertinent History Diabetes, herniated disc L4, HTN   Diagnostic tests Nerve conduction testing 12/16/15   Patient Stated Goals Would like to get movement back in my foot.   Currently in Pain? No/denies                    Neuro Re-education: Reviewed HEP given to patient last visit: Pt performs tandem stance and tandem gait at counter as well as SLS. Pt requires cues for upright posture, use of visual target, and need for hand hold at counter for improved stability.   In parallel bars on compliant surfaces:  Marching in place x 10 reps, alternating kicks x 10 reps, then alternating forward taps x 10 reps, mini  squats x 10 reps.  Tandem stance on compliant beam x 10 seconds each foot position.  Pt needs cues throughout for upright posture, use of visual target ahead (not looking at floor) and for use of parallel bars with UE support for improved stability.  Attempted tandem gait on compliant beam, with pt having excess eversion roll of RLE, stepping off of beam.  Pt reports no c/o pain, but does report this has happened before when getting up from floor.  Standing on foam beam:  Head turns x 10 reps, then head nods x 10 reps.  On incline surface and decline surface, marching in place x 10 reps, then forward step taps x 10, then head turns, head nods x 10 reps, for improved balance, and for improved use of intrinsic lower extremity/ankle musculature activation.      [facility] Adult PT Treatment/Exercise - 12/15/15 0001    Ambulation/Gait   Ambulation/Gait Yes   Ambulation/Gait Assistance 6: Modified independent (Device/Increase time)   Ambulation/Gait Assistance Details Used Ottobock Reaction brace during gait.  Noted to have improved R foot clearance, improved heelstrike and improved R step length.  Pt does need cues to increase step length on LLE and to imrpove reciprocal arm swing.   Ambulation Distance (Feet) 450 Feet  Assistive device None   Gait Pattern Step-through pattern;Decreased stride length;Trunk flexed   Ambulation Surface Level;Indoor   Gait Comments Short distance gait between activities in gym during therapy session with no AFO, with significant steppage gait pattern on RLE.                PT Education - 12/15/15 2213    Education provided Yes   Education Details Advised patient that orthotist is scheduled for 12/24/15 visit for orthotic consult   Person(s) Educated Patient   Methods Explanation   Comprehension Verbalized understanding             PT Long Term Goals - 12/02/15 1304    PT LONG TERM GOAL #1   Title Pt will perform HEP for improved ROM, strength, balance  and gait.  TARGET 01/01/16   Time 4   Period Weeks   Status New   PT LONG TERM GOAL #2   Title Pt will improve SLS each leg to at least 3 seconds for improved stair and obstacle negotiation.   Time 4   Period Weeks   Status New   PT LONG TERM GOAL #3   Title Pt will ambulate at least 500 ft, indoor/outdoor surfaces independently, no LOB for improved safety and efficiency with gait.   Time 4   Period Weeks   Status New   PT LONG TERM GOAL #4   Title Orthotic consult to be initiated, for proper fit/instruction on appropriate AFO for RLE.   Time 4   Period Weeks   Status New               Plan - 12/15/15 2214    Clinical Impression Statement Pt needs cueing with gait for equal step length and reciprocal arm swing with improved posture.  Pt's overall gait is greatly improved with use of Ottoboch reaction brace.  Pt has difficulty tandem activities on complaint beam.  Pt will continue to benefit from further skilled PT to address balance, gait, strength.   Rehab Potential Good   PT Frequency 2x / week  1x/wk for 1 week, then    PT Duration 4 weeks   PT Treatment/Interventions ADLs/Self Care Home Management;Therapeutic exercise;Therapeutic activities;Functional mobility training;Gait training;Balance training;Neuromuscular re-education;Patient/family education;Orthotic Fit/Training;Electrical Stimulation   PT Next Visit Plan compliant surface work-be aware of lateral instability with tandem activiites on foam   Consulted and Agree with Plan of Care Patient      Patient will benefit from skilled therapeutic intervention in order to improve the following deficits and impairments:  Abnormal gait, Decreased balance, Decreased mobility, Decreased range of motion, Decreased strength, Difficulty walking, Improper body mechanics, Postural dysfunction  Visit Diagnosis: Muscle weakness (generalized)  Other abnormalities of gait and mobility     Problem List Patient Active Problem  List   Diagnosis Date Noted  . 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 Essential hypertension 11/29/2015  . Type 2 diabetes mellitus without complication, without long-term current use of insulin ([place]) 11/29/2015    Yorel Redder W. 12/15/2015, 10:17 PM Frazier Butt., PT [facility] [address] [place], [place], [address] Phone: [phone]   Fax:  [phone]  Name: Justin Gomez MRN: [id number] Date of Birth: 22-Nov-1940

## 2015-12-16 ENCOUNTER — Ambulatory Visit (INDEPENDENT_AMBULATORY_CARE_PROVIDER_SITE_OTHER): Payer: PPO | Admitting: Diagnostic Neuroimaging

## 2015-12-16 ENCOUNTER — Encounter (INDEPENDENT_AMBULATORY_CARE_PROVIDER_SITE_OTHER): Payer: Self-pay | Admitting: Diagnostic Neuroimaging

## 2015-12-16 DIAGNOSIS — Z0289 Encounter for other administrative examinations: Secondary | ICD-10-CM

## 2015-12-16 DIAGNOSIS — M21371 Foot drop, right foot: Secondary | ICD-10-CM | POA: Diagnosis not present

## 2015-12-16 NOTE — Procedures (Signed)
   [facility]  NCS (NERVE CONDUCTION STUDY) WITH EMG (ELECTROMYOGRAPHY) REPORT   STUDY DATE: 12/16/15 PATIENT NAME: Justin Gomez DOB: [birth year] MRN: [id number]  ORDERING CLINICIAN: Rosalin Hawking, MD PhD   TECHNOLOGIST: Laretta Alstrom  ELECTROMYOGRAPHER: Earlean Polka. Penumalli, MD  CLINICAL INFORMATION: 75 year old male with right foot drop. Exam notable for weakness of right foot dorsiflexion and eversion. Right foot plantar flexion and inversion are normal. Evaluate for peroneal neuropathy.    FINDINGS: NERVE CONDUCTION STUDY: Right peroneal motor response has normal distal latency, decreased amplitude, conduction velocity and prolonged F-wave latency. There is partial conduction block with stimulation below the knee compared to the ankle.  Left peroneal motor response has decreased appetite, normal conduction velocity and normal F-wave latency.  Right tibial motor response has prolonged distal latency, normal amplitude, normal conduction velocity normal F-wave latency.  Left tibial motor response and F-wave latency are normal.  Right peroneal sensory response could not be obtained. Left peroneal sensory response is normal.    NEEDLE ELECTROMYOGRAPHY: Right gluteus medius, vastus medialis, biceps femoris short head, tibialis posterior - no abnormal spontaneous activity at rest; normal motor unit recruitment on exertion  Right tibialis anterior - 3+ positive sharp waves and fibrillation potentials at rest; decreased recruitment with rapid firing of discrete large motor units on exertion  Gastrocnemius - 1+ complex repetitive discharges at rest; normal motor unit recruitment on exertion  Peroneus brevis - 2+ positive sharp waves and fibrillation potentials at rest; decreased motor unit recruitment on exertion  Right L4-5 - negative  Right L5-S1 - 1+ complex repetitive discharges   IMPRESSION:  Abnormal study demonstrating: 1. 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 2. Right tibial and left peroneal motor responses have decreased amplitudes. May be due to right S1 and left L5 radiculopathies.  3. No definite evidence of widespread underlying polyneuropathy.     INTERPRETING PHYSICIAN:  Penni Bombard, MD Certified in Neurology, Neurophysiology and Neuroimaging  [facility] [address] [place], [place] [address] [phone]

## 2015-12-22 ENCOUNTER — Telehealth: Payer: Self-pay | Admitting: Neurology

## 2015-12-22 ENCOUNTER — Ambulatory Visit: Payer: PPO | Attending: Neurology | Admitting: Physical Therapy

## 2015-12-22 DIAGNOSIS — M6281 Muscle weakness (generalized): Secondary | ICD-10-CM | POA: Diagnosis present

## 2015-12-22 DIAGNOSIS — R2689 Other abnormalities of gait and mobility: Secondary | ICD-10-CM | POA: Insufficient documentation

## 2015-12-22 NOTE — Telephone Encounter (Signed)
Rn call patient about his EMG/nerve conduction test. Rn stated per Dr. Erlinda Hong note the test done recently in our office confirmed right side never damage at right foreleg. Pt needs continued PT/OT and AFO for foot drop. Thanks. PT stated he will be getting measure for the AFO on Friday. Pt verbalized understanding. Rn verified his appt on 02/07/2016 at 0230pm.

## 2015-12-22 NOTE — Telephone Encounter (Signed)
-----   Message from Rosalin Hawking, MD sent at 12/17/2015  2:08 AM EDT ----- Could you please let the patient know that the nerve conduction test done recently in our office confirmed right side never damage at right foreleg. Pt needs continued PT/OT and AFO for foot drop. Thanks.  Rosalin Hawking, MD PhD Stroke Neurology 12/17/2015 2:08 AM

## 2015-12-22 NOTE — Telephone Encounter (Signed)
Patient came in wanting to know the results to his NCV/EMG test.. He said he hasn't heard of anything about it.. The best number to contact patient is [phone]

## 2015-12-23 NOTE — Therapy (Signed)
[facility] [address] [place], [place], [address] Phone: [phone]   Fax:  [phone]  Physical Therapy Treatment  Patient Details  Name: Justin Gomez MRN: [id number] Date of Birth: 1941/04/04 Referring Provider: Rosalin Hawking  Encounter Date: 12/22/2015      PT End of Session - 12/23/15 1155    Visit Number 6   Number of Visits 9   Date for PT Re-Evaluation 01/31/16   Authorization Type [employer] -G[employer] every 10th visit   PT Start Time 1020   PT Stop Time 1059   PT Time Calculation (min) 39 min   Equipment Utilized During Treatment Gait belt   Activity Tolerance Patient tolerated treatment well   Behavior During Therapy [facility] for tasks assessed/performed      Past Medical History  Diagnosis Date  . Diabetes mellitus   . Hyperlipidemia   . Hypertension   . BPH (benign prostatic hypertrophy)   . Hypothyroidism   . Lumbar herniated disc     L4  . Diverticulosis   . Hiatal hernia     Past Surgical History  Procedure Laterality Date  . Appendectomy  1956  . Knee surgery  2009    right  . Shoulder surgery  2010    left    There were no vitals filed for this visit.      Subjective Assessment - 12/23/15 1147    Subjective Had nerve conduction study, but haven't gotten the results yet.   Pertinent History Diabetes, herniated disc L4, HTN   Diagnostic tests Nerve conduction testing 12/16/15   Patient Stated Goals Would like to get movement back in my foot.   Currently in Pain? No/denies                         [facility] Adult PT Treatment/Exercise - 12/23/15 1148    Ambulation/Gait   Ambulation/Gait Yes   Ambulation/Gait Assistance 6: Modified independent (Device/Increase time)   Ambulation/Gait Assistance Details Used Ottobock Reaction brace during gait and standing activities in therapy session.  Noted to have improved heelstrike and step length, with occasional cues for increased  stance time on RLE for improved L step length.   Ambulation Distance (Feet) 350 Feet  then 500 ft outdoors   Assistive device None   Gait Pattern Step-through pattern;Decreased stride length;Trunk flexed   Ambulation Surface Level;Indoor;Unlevel;Outdoor;Paved   High Level Balance   High Level Balance Comments On foam:  marching in place x 10, alternating hip kicks x 10 reps, then forward step taps, side step taps, back step taps x 10 reps with UE support.               Balance Exercises - 12/23/15 1154    Balance Exercises: Standing   Standing Eyes Opened Wide (BOA);Narrow base of support (BOS);Foam/compliant surface;Head turns;Other reps (comment)  Head nods x 10 reps, with intermittent UE support   Standing Eyes Closed Wide (BOA);Narrow base of support (BOS);Foam/compliant surface;2 reps;10 secs  UE support   Other Standing Exercises Alternating step taps to 6" step, 12" step forward x 10 reps each, then side step taps x 10 reps (needs sitting break due to leg fatigue)  On ramp incline and decline:  standing with feet apart head turns x 10, head nods x 10, then eyes closed x 10 seconds; marching in place x 10 reps; on incliine:  forward step taps x 10 reps.  PT Long Term Goals - 12/02/15 1304    PT LONG TERM GOAL #1   Title Pt will perform HEP for improved ROM, strength, balance and gait.  TARGET 01/01/16   Time 4   Period Weeks   Status New   PT LONG TERM GOAL #2   Title Pt will improve SLS each leg to at least 3 seconds for improved stair and obstacle negotiation.   Time 4   Period Weeks   Status New   PT LONG TERM GOAL #3   Title Pt will ambulate at least 500 ft, indoor/outdoor surfaces independently, no LOB for improved safety and efficiency with gait.   Time 4   Period Weeks   Status New   PT LONG TERM GOAL #4   Title Orthotic consult to be initiated, for proper fit/instruction on appropriate AFO for RLE.   Time 4   Period Weeks   Status New                Plan - 12/23/15 1155    Clinical Impression Statement Pt tolerates progression of balance activities well while wearing Ottobock reaction AFO.  He does continue to need UE support for balance and stability.  Pt at times appears to have increased supination, inversion on RLE during activities on compliant surfaces.  Pt will continue to benefit from further skilled PT to address balance, gait and strength, with AFO consult next visit.   Rehab Potential Good   PT Frequency 2x / week  1x/wk for 1 week, then    PT Duration 4 weeks   PT Treatment/Interventions ADLs/Self Care Home Management;Therapeutic exercise;Therapeutic activities;Functional mobility training;Gait training;Balance training;Neuromuscular re-education;Patient/family education;Orthotic Fit/Training;Electrical Stimulation   PT Next Visit Plan compliant surface work-be aware of lateral instability with tandem activiites on foam; SLS activities; need to schedule 1 additional week (2 visits) of therapy-may want to wait until AFO delivered to patient  orthotist to be present at 12/24/15 session for AFO consult   Consulted and Agree with Plan of Care Patient      Patient will benefit from skilled therapeutic intervention in order to improve the following deficits and impairments:  Abnormal gait, Decreased balance, Decreased mobility, Decreased range of motion, Decreased strength, Difficulty walking, Improper body mechanics, Postural dysfunction  Visit Diagnosis: Other abnormalities of gait and mobility  Muscle weakness (generalized)     Problem List Patient Active Problem List   Diagnosis Date Noted  . 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 Essential hypertension 11/29/2015  . Type 2 diabetes mellitus without complication, without long-term current use of insulin ([place]) 11/29/2015    Justin Friis W. 12/23/2015, 12:00 PM Frazier Butt., PT [facility] [address] [place], [place], [address] Phone: [phone]   Fax:  [phone]  Name: Justin Gomez MRN: [id number] Date of Birth: 1940-12-24

## 2015-12-24 ENCOUNTER — Ambulatory Visit: Payer: PPO | Admitting: Physical Therapy

## 2015-12-24 DIAGNOSIS — R2689 Other abnormalities of gait and mobility: Secondary | ICD-10-CM | POA: Diagnosis not present

## 2015-12-24 DIAGNOSIS — M6281 Muscle weakness (generalized): Secondary | ICD-10-CM

## 2015-12-24 NOTE — Therapy (Signed)
[facility] [address] [place], [place], [address] Phone: [phone]   Fax:  [phone]  Physical Therapy Treatment  Patient Details  Name: Justin Gomez MRN: [id number] Date of Birth: March 05, 1941 Referring Provider: Rosalin Hawking  Encounter Date: 12/24/2015      PT End of Session - 12/24/15 1207    Visit Number 7   Number of Visits 9   Date for PT Re-Evaluation 01/31/16   Authorization Type [employer] -GCode every 10th visit   PT Start Time 1016   PT Stop Time 1106   PT Time Calculation (min) 50 min   Equipment Utilized During Treatment Gait belt   Activity Tolerance Patient tolerated treatment well   Behavior During Therapy [facility] for tasks assessed/performed      Past Medical History  Diagnosis Date  . Diabetes mellitus   . Hyperlipidemia   . Hypertension   . BPH (benign prostatic hypertrophy)   . Hypothyroidism   . Lumbar herniated disc     L4  . Diverticulosis   . Hiatal hernia     Past Surgical History  Procedure Laterality Date  . Appendectomy  1956  . Knee surgery  2009    right  . Shoulder surgery  2010    left    There were no vitals filed for this visit.      Subjective Assessment - 12/24/15 1019    Subjective Have not heard back from nerve conduction study yet.   Pertinent History Diabetes, herniated disc L4, HTN   Diagnostic tests Nerve conduction testing 12/16/15   Patient Stated Goals Would like to get movement back in my foot.   Currently in Pain? No/denies            Orthotist present during PT session to assist with orthotic assessment: -Gait training with no device/orthosis, 150 ft, with steppage gait on RLE. -Gait trial with foot up brace on RLE, x 230 ft with decreased steppage gait, with improved step length.  However, pt continues to demonstrate foot slap on R. -Gait trial with Walk-On PLS AFO  X 230 ft with decreased steppage gait, with improved step length and  decreased foot slap (until last 20 ft of gait).  Outdoor gait on grass x 70 ft, with increased supination/inversion.  Further gait trial level indoor surface and outdoor grassy surfaces with Walk-On PLS AFO with T-strap placed on lateral aspect of AFO to prevent lateral instability.  Pt with somewhat improved lateral stability, but pt does report he feels continued increased weightbearing through lateral aspect of R foot. -Gait trial with Pro-care lace up brace to control lateral instability, with Walk-On PLS AFO-pt does not note any difference from previous trial with walk-on and T-strap.   Pt needs cues for looking ahead versus looking down at ground (throughout multiple gait trials).    Ultimately, PT and orthotist feel that custom molded AFO would be best option, and pt will likely have casting and fitting completed at [facility].  Then, pt to plan for return to PT for further gait training with AFO.                  PT Education - 12/24/15 1206    Education provided Yes   Education Details Educated on various types of braces and benefits/drawbacks of each   Person(s) Educated Patient   Methods Explanation;Demonstration   Comprehension Verbalized understanding             PT Long  Term Goals - 12/02/15 1304    PT LONG TERM GOAL #1   Title Pt will perform HEP for improved ROM, strength, balance and gait.  TARGET 01/01/16   Time 4   Period Weeks   Status New   PT LONG TERM GOAL #2   Title Pt will improve SLS each leg to at least 3 seconds for improved stair and obstacle negotiation.   Time 4   Period Weeks   Status New   PT LONG TERM GOAL #3   Title Pt will ambulate at least 500 ft, indoor/outdoor surfaces independently, no LOB for improved safety and efficiency with gait.   Time 4   Period Weeks   Status New   PT LONG TERM GOAL #4   Title Orthotic consult to be initiated, for proper fit/instruction on appropriate AFO for RLE.   Time 4   Period Weeks    Status New               Plan - 12/24/15 1207    Clinical Impression Statement Multiple types of AFOs and braces trialed today with orthotist present.  Pt able to improve gait pattern with decreased steppage gait; however, PT and orthotist feel that lateral instability also need to be addressed.  For this reason, custom AFO may be the best option.  Likely, patient will go to orthotist clinic for casting/fitting and plan to return to PT for further gait training upon receiving AFO.  Will likely hold further PT until AFO arrives.   Rehab Potential Good   PT Frequency 2x / week  1x/wk for 1 week, then    PT Duration 4 weeks   PT Treatment/Interventions ADLs/Self Care Home Management;Therapeutic exercise;Therapeutic activities;Functional mobility training;Gait training;Balance training;Neuromuscular re-education;Patient/family education;Orthotic Fit/Training;Electrical Stimulation   PT Next Visit Plan  need to schedule 1 additional week (2 visits) of therapy-may want to wait until AFO delivered to patient  orthotist to be present at 12/24/15 session for AFO consult   Consulted and Agree with Plan of Care Patient      Patient will benefit from skilled therapeutic intervention in order to improve the following deficits and impairments:  Abnormal gait, Decreased balance, Decreased mobility, Decreased range of motion, Decreased strength, Difficulty walking, Improper body mechanics, Postural dysfunction  Visit Diagnosis: Other abnormalities of gait and mobility  Muscle weakness (generalized)     Problem List Patient Active Problem List   Diagnosis Date Noted  . 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 Essential hypertension 11/29/2015  . Type 2 diabetes mellitus without complication, without long-term current use of insulin ([place]) 11/29/2015    Shera Laubach W. 12/24/2015, 12:11 PM  Frazier Butt., PT  [facility] [address] [place], [place], [address] Phone: [phone]   Fax:  [phone]  Name: Justin Gomez MRN: [id number] Date of Birth: Jul 24, 1940

## 2016-02-07 ENCOUNTER — Ambulatory Visit (INDEPENDENT_AMBULATORY_CARE_PROVIDER_SITE_OTHER): Payer: PPO | Admitting: Neurology

## 2016-02-07 ENCOUNTER — Encounter: Payer: Self-pay | Admitting: Neurology

## 2016-02-07 VITALS — BP 109/63 | HR 78 | Ht 72.0 in | Wt 183.0 lb

## 2016-02-07 DIAGNOSIS — I10 Essential (primary) hypertension: Secondary | ICD-10-CM | POA: Diagnosis not present

## 2016-02-07 DIAGNOSIS — G5731 Lesion of lateral popliteal nerve, right lower limb: Secondary | ICD-10-CM

## 2016-02-07 DIAGNOSIS — G5730 Lesion of lateral popliteal nerve, unspecified lower limb: Secondary | ICD-10-CM | POA: Insufficient documentation

## 2016-02-07 DIAGNOSIS — G5701 Lesion of sciatic nerve, right lower limb: Secondary | ICD-10-CM

## 2016-02-07 DIAGNOSIS — E119 Type 2 diabetes mellitus without complications: Secondary | ICD-10-CM | POA: Diagnosis not present

## 2016-02-07 HISTORY — DX: Lesion of lateral popliteal nerve, unspecified lower limb: G57.30

## 2016-02-07 NOTE — Progress Notes (Signed)
NEUROLOGY CLINIC NEW PATIENT NOTE  NAME: Justin Gomez DOB: 1940/06/24 REFERRING PHYSICIAN: Jani Gravel, MD  I saw Justin Gomez as a new consult in the neurovascular clinic today regarding  Chief Complaint  Patient presents with  . Follow-up    RIght foot drop, abnormal gait follow up. Pt stated he was in PT and it was stop because he need to see Dr. Erlinda Hong. Pt states if he needs the brace he will go back to rehab.  Marland Kitchen  HPI: Justin Gomez is a 75 y.o. male with PMH of HTN and DM who presents as a new patient for right foot drop.   Patient stated that about 3 weeks ago, he noticed right foot drop, not able to dorsiflex his right foot. He has to result his right leg for walking. Denies any pain, no ankle sprain, felt some numbness tingling at the bottom right foot. No numbness at right leg, no back pain. Went to see PCP Dr. Maudie Mercury on 11/19/2015, and referred to neurology for further evaluation.  He denies any headache, facial droop, arm weakness, speech difficulty or visual changes. He denies any compression on the right foot or leg, recent surgery, or recent dramatic weight loss. He does admit that he frequently cross his legs.   Her history hypertension and diabetes, however, there very well controlled. Blood pressure today 109/68 and his recent A1c 6.2. He denies any cigarette smoking, or illicit drugs. He stated that he drinks 2 glass of wine every week.  Interval history: During the interval time, he had EMG/NCS at the end of June showed active and chronic denervation at right common peroneal N, consistent with common peroneal neuropathy. He follows with PT/OT for therapy and has not get the brace yet. One week ago, his right foot drop started to improve and for the last week, he can do more dorsiflexion and now his walking also improved with less steppage walking. BP 109/63 and glucose at home 95-115.   Past Medical History:  Diagnosis Date  . BPH (benign prostatic hypertrophy)   .  Diabetes mellitus   . Diverticulosis   . Hiatal hernia   . Hyperlipidemia   . Hypertension   . Hypothyroidism   . Lumbar herniated disc    L4   Past Surgical History:  Procedure Laterality Date  . APPENDECTOMY  1956  . KNEE SURGERY  2009   right  . SHOULDER SURGERY  2010   left   Family History  Problem Relation Age of Onset  . Cerebral aneurysm Father   . Hypertension Father   . Diabetes Mother   . Hypertension Brother   . Diabetes Paternal Grandmother   . Colon cancer Neg Hx    Current Outpatient Prescriptions  Medication Sig Dispense Refill  . aspirin 81 MG tablet Take 81 mg by mouth daily.     Marland Kitchen glipiZIDE (GLUCOTROL) 5 MG tablet     . JANUMET XR [phone] MG TB24     . levothyroxine (SYNTHROID, LEVOTHROID) 75 MCG tablet Take 75 mcg by mouth daily.    Marland Kitchen lisinopril (PRINIVIL,ZESTRIL) 10 MG tablet Take 10 mg by mouth daily.    . metFORMIN (GLUCOPHAGE-XR) 750 MG 24 hr tablet     . mirabegron ER (MYRBETRIQ) 25 MG TB24 tablet Take 25 mg by mouth daily.    Marland Kitchen omeprazole (PRILOSEC) 20 MG capsule      No current facility-administered medications for this visit.    No Known Allergies Social History  Social History  . Marital status: Married    Spouse name: N/A  . Number of children: 3  . Years of education: N/A   Occupational History  . business executive    Social History Main Topics  . Smoking status: Never Smoker  . Smokeless tobacco: Never Used  . Alcohol use 1.8 oz/week    2 Standard drinks or equivalent, 1 Glasses of wine per week     Comment: Rarely  . Drug use: No  . Sexual activity: Not on file   Other Topics Concern  . Not on file   Social History Narrative  . No narrative on file    Review of Systems Full 14 system review of systems performed and notable only for those listed, all others are neg:  Constitutional:   Cardiovascular:  Ear/Nose/Throat:   Skin:  Eyes:   Respiratory:   Gastroitestinal:   Genitourinary: frequent  urination Hematology/Lymphatic:   Endocrine:  Musculoskeletal:   Allergy/Immunology:   Neurological:   Psychiatric:  Sleep:    Physical Exam  Vitals:   02/07/16 1434  BP: 109/63  Pulse: 78    General - Well nourished, well developed, in no apparent distress.  Ophthalmologic - Sharp disc margins OU.  Cardiovascular - Regular rate and rhythm.   Neck - supple, no nuchal rigidity .  Mental Status -  Level of arousal and orientation to time, place, and person were intact. Language including expression, naming, repetition, comprehension, reading, and writing was assessed and found intact. Fund of Knowledge was assessed and was intact.  Cranial Nerves II - XII - II - Visual field intact OU. III, IV, VI - Extraocular movements intact. V - Facial sensation intact bilaterally. VII - Facial movement intact bilaterally. VIII - Hearing & vestibular intact bilaterally. X - Palate elevates symmetrically. XI - Chin turning & shoulder shrug intact bilaterally. XII - Tongue protrusion intact.  Motor Strength - The patient's strength was normal in all extremities Except 3/5 right foot dorsiflexion and pronator drift was absent.  Bulk was normal and fasciculations were absent.   Motor Tone - Muscle tone was assessed at the neck and appendages and was normal.  Reflexes - The patient's reflexes were normal in all extremities and he had no pathological reflexes.  Sensory - Light touch, temperature/pinprick were assessed and were normal.    Coordination - The patient had normal movements in the hands and feet with no ataxia or dysmetria.  Tremor was absent.  Gait and Station - mild right steppage gait.   Imaging  EMG -  1. 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 2. Right tibial and left peroneal motor responses have decreased amplitudes. May be due to right S1 and left L5 radiculopathies.  3. No definite evidence of widespread underlying  polyneuropathy.  Lab Review none   Assessment:   In summary, Justin Gomez is a 75 y.o. male with PMH of HTN and DM presents right foot drop. Exam showed right foot 0/5 dorsiflex and 4/5 eversion with right proximal big toe decreased sensation, consistent with right common peritoneal N. Injury. 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 Only risk factor so far is frequent leg crossing. Since last week, right foot started improve and now right DF 3/5 and less steppage walking.   Plan: - avoid cross legs - continue finish off therapy and then do it at home - hold off tright foot brace as right  foot drop much improved. - check BP and glucose at home - follow up with Dr. Maudie Mercury as scheduled - avoid ankle sprain or fall - follow up in 3 months.   No orders of the defined types were placed in this encounter.   No orders of the defined types were placed in this encounter.   Patient Instructions  - we are pleased that your right foot droop much improved.  - avoid cross legs - continue finish off therapy and then do it at home - we can hold off tright foot brace - check BP and glucose at home - follow up with Dr. Maudie Mercury as scheduled - avoid ankle sprain or fall - follow up in 3 months.   Rosalin Hawking, MD PhD [facility] [address], [place], [place] [address] [phone]

## 2016-02-07 NOTE — Patient Instructions (Signed)
-   we are pleased that your right foot droop much improved.  - avoid cross legs - continue finish off therapy and then do it at home - we can hold off tright foot brace - check BP and glucose at home - follow up with Dr. Maudie Mercury as scheduled - avoid ankle sprain or fall - follow up in 3 months.

## 2016-02-25 DIAGNOSIS — S61459A Open bite of unspecified hand, initial encounter: Secondary | ICD-10-CM | POA: Diagnosis not present

## 2016-02-25 DIAGNOSIS — Z23 Encounter for immunization: Secondary | ICD-10-CM | POA: Diagnosis not present

## 2016-03-22 DIAGNOSIS — E039 Hypothyroidism, unspecified: Secondary | ICD-10-CM | POA: Diagnosis not present

## 2016-03-22 DIAGNOSIS — Z13820 Encounter for screening for osteoporosis: Secondary | ICD-10-CM | POA: Diagnosis not present

## 2016-03-22 DIAGNOSIS — I10 Essential (primary) hypertension: Secondary | ICD-10-CM | POA: Diagnosis not present

## 2016-03-22 DIAGNOSIS — E1165 Type 2 diabetes mellitus with hyperglycemia: Secondary | ICD-10-CM | POA: Diagnosis not present

## 2016-03-22 DIAGNOSIS — Z125 Encounter for screening for malignant neoplasm of prostate: Secondary | ICD-10-CM | POA: Diagnosis not present

## 2016-03-27 DIAGNOSIS — Z23 Encounter for immunization: Secondary | ICD-10-CM | POA: Diagnosis not present

## 2016-03-27 DIAGNOSIS — K219 Gastro-esophageal reflux disease without esophagitis: Secondary | ICD-10-CM | POA: Diagnosis not present

## 2016-03-27 DIAGNOSIS — I10 Essential (primary) hypertension: Secondary | ICD-10-CM | POA: Diagnosis not present

## 2016-03-27 DIAGNOSIS — R1310 Dysphagia, unspecified: Secondary | ICD-10-CM | POA: Diagnosis not present

## 2016-03-27 DIAGNOSIS — E039 Hypothyroidism, unspecified: Secondary | ICD-10-CM | POA: Diagnosis not present

## 2016-04-11 DIAGNOSIS — R3915 Urgency of urination: Secondary | ICD-10-CM | POA: Diagnosis not present

## 2016-04-11 DIAGNOSIS — R972 Elevated prostate specific antigen [PSA]: Secondary | ICD-10-CM | POA: Diagnosis not present

## 2016-04-11 DIAGNOSIS — R351 Nocturia: Secondary | ICD-10-CM | POA: Diagnosis not present

## 2016-04-11 DIAGNOSIS — R350 Frequency of micturition: Secondary | ICD-10-CM | POA: Diagnosis not present

## 2016-04-13 ENCOUNTER — Ambulatory Visit (INDEPENDENT_AMBULATORY_CARE_PROVIDER_SITE_OTHER): Payer: PPO | Admitting: Gastroenterology

## 2016-04-13 ENCOUNTER — Encounter: Payer: Self-pay | Admitting: Gastroenterology

## 2016-04-13 VITALS — BP 112/78 | HR 80 | Ht 72.0 in | Wt 184.4 lb

## 2016-04-13 DIAGNOSIS — M21371 Foot drop, right foot: Secondary | ICD-10-CM

## 2016-04-13 DIAGNOSIS — K219 Gastro-esophageal reflux disease without esophagitis: Secondary | ICD-10-CM | POA: Diagnosis not present

## 2016-04-13 DIAGNOSIS — R059 Cough, unspecified: Secondary | ICD-10-CM | POA: Insufficient documentation

## 2016-04-13 DIAGNOSIS — R05 Cough: Secondary | ICD-10-CM

## 2016-04-13 HISTORY — DX: Gastro-esophageal reflux disease without esophagitis: K21.9

## 2016-04-13 MED ORDER — OMEPRAZOLE 40 MG PO CPDR: 40.0000 mg | DELAYED_RELEASE_CAPSULE | Freq: Two times a day (BID) | ORAL | 3 refills | Status: DC

## 2016-04-13 NOTE — Patient Instructions (Signed)
You have been scheduled for a Barium Esophogram at [facility] (1st floor of the hospital) on 04/27/16 at 1030 am. Please arrive 15 minutes prior to your appointment for registration. Make certain not to have anything to eat or drink 6 hours prior to your test. If you need to reschedule for any reason, please contact radiology at [phone] to do so. __________________________________________________________________ A barium swallow is an examination that concentrates on views of the esophagus. This tends to be a double contrast exam (barium and two liquids which, when combined, create a gas to distend the wall of the oesophagus) or single contrast (non-ionic iodine based). The study is usually tailored to your symptoms so a good history is essential. Attention is paid during the study to the form, structure and configuration of the esophagus, looking for functional disorders (such as aspiration, dysphagia, achalasia, motility and reflux) EXAMINATION You may be asked to change into a gown, depending on the type of swallow being performed. A radiologist and radiographer will perform the procedure. The radiologist will advise you of the type of contrast selected for your procedure and direct you during the exam. You will be asked to stand, sit or lie in several different positions and to hold a small amount of fluid in your mouth before being asked to swallow while the imaging is performed .In some instances you may be asked to swallow barium coated marshmallows to assess the motility of a solid food bolus. The exam can be recorded as a digital or video fluoroscopy procedure. POST PROCEDURE It will take 1-2 days for the barium to pass through your system. To facilitate this, it is important, unless otherwise directed, to increase your fluids for the next 24-48hrs and to resume your normal diet.  This test typically takes about 30 minutes to  perform. __________________________________________________________________________________

## 2016-04-13 NOTE — Progress Notes (Signed)
Reviewed and agree with management plan.  Srah Ake T. Ghalia Reicks, MD FACG 

## 2016-04-13 NOTE — Progress Notes (Signed)
04/13/2016 WILBORN DEETER [id number] [birth year]   HISTORY OF PRESENT ILLNESS:  This is a 75 year old male who is known to Dr. Fuller Plan.  He had complaints of dysphagia in the past and underwent EGD in 06/2011 that was actually normal but he had esophagus dilated empirically.  He has now been on omeprazole 20 mg BID for quite some time.   Presents to our office today at the request of his PCP, Dr. Maudie Mercury, with complaints of what he says is gurgling liquid that comes back up his esophagus.  Says that this occurs sometimes when he is eating but sometimes just randomly when he is sitting.  Liquid comes back up and then it feels like it goes down the "wrong pipe" and causes him to cough.  He denies any burning sensation.  No chronic cough, just coughs when these episodes occur.  As stated above, he is on omeprazole 20 mg BID.  Says that occasionally he has a had time swallowing a large pill but overall no issues with swallowing food or liquids.  No nausea, vomiting, unintentional recent weight loss, abdominal pain, etc.   Past Medical History:  Diagnosis Date  . BPH (benign prostatic hypertrophy)   . Diabetes mellitus   . Diverticulosis   . Hiatal hernia   . Hyperlipidemia   . Hypertension   . Hypothyroidism   . Lumbar herniated disc    L4   Past Surgical History:  Procedure Laterality Date  . APPENDECTOMY  1956  . KNEE SURGERY  2009   right  . SHOULDER SURGERY  2010   left    reports that he has never smoked. He has never used smokeless tobacco. He reports that he drinks about 1.8 oz of alcohol per week . He reports that he does not use drugs. family history includes Cerebral aneurysm in his father; Diabetes in his mother and paternal grandmother; Hypertension in his brother and father. No Known Allergies    Outpatient Encounter Prescriptions as of 04/13/2016  Medication Sig  . aspirin 81 MG tablet Take 81 mg by mouth daily.   Marland Kitchen glipiZIDE (GLUCOTROL) 5 MG tablet   . JANUMET XR  [phone] MG TB24   . levothyroxine (SYNTHROID, LEVOTHROID) 75 MCG tablet Take 75 mcg by mouth daily.  Marland Kitchen lisinopril (PRINIVIL,ZESTRIL) 10 MG tablet Take 10 mg by mouth daily.  . metFORMIN (GLUCOPHAGE-XR) 750 MG 24 hr tablet   . omeprazole (PRILOSEC) 20 MG capsule 2 (two) times daily before a meal.   . tamsulosin (FLOMAX) 0.4 MG CAPS capsule Take 0.4 mg by mouth daily.  . [DISCONTINUED] mirabegron ER (MYRBETRIQ) 25 MG TB24 tablet Take 25 mg by mouth daily.   No facility-administered encounter medications on file as of 04/13/2016.      REVIEW OF SYSTEMS  : All other systems reviewed and negative except where noted in the History of Present Illness.   PHYSICAL EXAM: BP 112/78 (BP Location: Left Arm, Patient Position: Sitting, Cuff Size: Large)   Pulse 80   Ht 6' (1.829 m)   Wt 184 lb 6.4 oz (83.6 kg)   BMI 25.01 kg/m  General: Well developed white male in no acute distress Head: Normocephalic and atraumatic Eyes:  Sclerae anicteric, conjunctiva pink. Ears: Normal auditory acuity Lungs: Clear throughout to auscultation Heart: Regular rate and rhythm Abdomen: Soft, non-distended.  Normal bowel sounds.  Non-tender. Musculoskeletal: Symmetrical with no gross deformities  Skin: No lesions on visible extremities Extremities: No edema  Neurological: Alert  oriented x 4, grossly non-focal Psychological:  Alert and cooperative. Normal mood and affect  ASSESSMENT AND PLAN: -75 year old male with complaints of what sounds like reflux/regurgitation, which then causes him to cough.  Occasional dysphagia to large pills but overall no other complaints of dysphagia.  Will start with esophagram to further evaluate.  ? If he will need EGD vs MBSS.  Will increased omeprazole to 40 mg BID in the interim.  CC:  Jani Gravel, MD

## 2016-04-27 ENCOUNTER — Ambulatory Visit (HOSPITAL_COMMUNITY): Payer: PPO

## 2016-05-04 ENCOUNTER — Ambulatory Visit (HOSPITAL_COMMUNITY)
Admission: RE | Admit: 2016-05-04 | Discharge: 2016-05-04 | Disposition: A | Discharge status: Home / Self Care | Payer: PPO | Source: Ambulatory Visit | Attending: Gastroenterology | Admitting: Gastroenterology

## 2016-05-04 DIAGNOSIS — R05 Cough: Secondary | ICD-10-CM

## 2016-05-04 DIAGNOSIS — R0600 Dyspnea, unspecified: Secondary | ICD-10-CM | POA: Diagnosis not present

## 2016-05-04 DIAGNOSIS — K219 Gastro-esophageal reflux disease without esophagitis: Secondary | ICD-10-CM | POA: Diagnosis present

## 2016-05-04 DIAGNOSIS — R938 Abnormal findings on diagnostic imaging of other specified body structures: Secondary | ICD-10-CM | POA: Diagnosis not present

## 2016-05-04 DIAGNOSIS — K228 Other specified diseases of esophagus: Secondary | ICD-10-CM | POA: Diagnosis not present

## 2016-05-04 DIAGNOSIS — R059 Cough, unspecified: Secondary | ICD-10-CM

## 2016-05-18 ENCOUNTER — Encounter: Payer: Self-pay | Admitting: Physical Therapy

## 2016-05-18 ENCOUNTER — Ambulatory Visit: Payer: PPO | Admitting: Neurology

## 2016-05-18 NOTE — Therapy (Signed)
[facility] [address] [place], [place], [address] Phone: [phone]   Fax:  [phone]  Patient Details  Name: Justin Gomez MRN: [id number] Date of Birth: 12-28-1940 Referring Provider:  No ref. provider found  Encounter Date: 05/18/2016  PHYSICAL THERAPY DISCHARGE SUMMARY  Visits from Start of Care: 7  Current functional level related to goals / functional outcomes:     PT Long Term Goals - 12/02/15 1304      PT LONG TERM GOAL #1   Title Pt will perform HEP for improved ROM, strength, balance and gait.  TARGET 01/01/16   Time 4   Period Weeks   Status New     PT LONG TERM GOAL #2   Title Pt will improve SLS each leg to at least 3 seconds for improved stair and obstacle negotiation.   Time 4   Period Weeks   Status New     PT LONG TERM GOAL #3   Title Pt will ambulate at least 500 ft, indoor/outdoor surfaces independently, no LOB for improved safety and efficiency with gait.   Time 4   Period Weeks   Status New     PT LONG TERM GOAL #4   Title Orthotic consult to be initiated, for proper fit/instruction on appropriate AFO for RLE.   Time 4   Period Weeks   Status New    NOt able to be fully assessed, as PT assisted patient in scheduling orthotic consult and was awaiting AFOs so that patient could return to therapy.  Pt declines AFOs (per orthotist report) and did not return to therapy   Remaining deficits: Gait, weakness   Education / Equipment: HEP  Plan: Patient agrees to discharge.  Patient goals were not met. Patient is being discharged due to not returning since the last visit.  ?????      Bathsheba Durrett W. 05/18/2016, 11:37 AM  Frazier Butt., PT [facility] [address] [place], [place], [address] Phone: [phone]   Fax:  [phone]

## 2016-06-08 ENCOUNTER — Other Ambulatory Visit: Payer: Self-pay

## 2016-06-08 DIAGNOSIS — K228 Other specified diseases of esophagus: Secondary | ICD-10-CM

## 2016-06-08 DIAGNOSIS — K2289 Other specified disease of esophagus: Secondary | ICD-10-CM

## 2016-06-09 ENCOUNTER — Other Ambulatory Visit (HOSPITAL_COMMUNITY): Payer: Self-pay | Admitting: Gastroenterology

## 2016-06-09 DIAGNOSIS — R1319 Other dysphagia: Secondary | ICD-10-CM

## 2016-06-22 ENCOUNTER — Ambulatory Visit (HOSPITAL_COMMUNITY)
Admission: RE | Admit: 2016-06-22 | Discharge: 2016-06-22 | Disposition: A | Discharge status: Home / Self Care | Payer: PPO | Source: Ambulatory Visit | Attending: Gastroenterology | Admitting: Gastroenterology

## 2016-06-22 DIAGNOSIS — R1319 Other dysphagia: Secondary | ICD-10-CM | POA: Diagnosis present

## 2016-06-22 DIAGNOSIS — K2289 Other specified disease of esophagus: Secondary | ICD-10-CM

## 2016-06-22 DIAGNOSIS — K228 Other specified diseases of esophagus: Secondary | ICD-10-CM | POA: Insufficient documentation

## 2016-06-22 DIAGNOSIS — K219 Gastro-esophageal reflux disease without esophagitis: Secondary | ICD-10-CM | POA: Diagnosis not present

## 2016-07-26 DIAGNOSIS — I10 Essential (primary) hypertension: Secondary | ICD-10-CM | POA: Diagnosis not present

## 2016-07-26 DIAGNOSIS — E039 Hypothyroidism, unspecified: Secondary | ICD-10-CM | POA: Diagnosis not present

## 2016-07-26 DIAGNOSIS — E1165 Type 2 diabetes mellitus with hyperglycemia: Secondary | ICD-10-CM | POA: Diagnosis not present

## 2016-07-31 DIAGNOSIS — R1310 Dysphagia, unspecified: Secondary | ICD-10-CM | POA: Diagnosis not present

## 2016-07-31 DIAGNOSIS — E1165 Type 2 diabetes mellitus with hyperglycemia: Secondary | ICD-10-CM | POA: Diagnosis not present

## 2016-07-31 DIAGNOSIS — K219 Gastro-esophageal reflux disease without esophagitis: Secondary | ICD-10-CM | POA: Diagnosis not present

## 2016-07-31 DIAGNOSIS — R05 Cough: Secondary | ICD-10-CM | POA: Diagnosis not present

## 2016-08-09 DIAGNOSIS — I10 Essential (primary) hypertension: Secondary | ICD-10-CM | POA: Diagnosis not present

## 2016-08-09 DIAGNOSIS — R51 Headache: Secondary | ICD-10-CM | POA: Diagnosis not present

## 2016-08-09 DIAGNOSIS — E039 Hypothyroidism, unspecified: Secondary | ICD-10-CM | POA: Diagnosis not present

## 2016-08-09 DIAGNOSIS — R441 Visual hallucinations: Secondary | ICD-10-CM | POA: Diagnosis not present

## 2016-08-15 ENCOUNTER — Other Ambulatory Visit: Payer: Self-pay | Admitting: Internal Medicine

## 2016-08-15 DIAGNOSIS — R519 Headache, unspecified: Secondary | ICD-10-CM

## 2016-08-15 DIAGNOSIS — R51 Headache: Principal | ICD-10-CM

## 2016-08-15 DIAGNOSIS — R059 Cough, unspecified: Secondary | ICD-10-CM

## 2016-08-15 DIAGNOSIS — R05 Cough: Secondary | ICD-10-CM

## 2016-08-15 DIAGNOSIS — R1310 Dysphagia, unspecified: Secondary | ICD-10-CM

## 2016-08-17 ENCOUNTER — Ambulatory Visit: Payer: PPO | Admitting: Physical Therapy

## 2016-08-25 ENCOUNTER — Ambulatory Visit: Payer: PPO | Admitting: Rehabilitation

## 2016-08-30 ENCOUNTER — Other Ambulatory Visit: Payer: PPO

## 2016-09-05 ENCOUNTER — Ambulatory Visit
Admission: RE | Admit: 2016-09-05 | Discharge: 2016-09-05 | Disposition: A | Discharge status: Home / Self Care | Payer: PPO | Source: Ambulatory Visit | Attending: Internal Medicine | Admitting: Internal Medicine

## 2016-09-05 DIAGNOSIS — R059 Cough, unspecified: Secondary | ICD-10-CM

## 2016-09-05 DIAGNOSIS — R1310 Dysphagia, unspecified: Secondary | ICD-10-CM

## 2016-09-05 DIAGNOSIS — R05 Cough: Secondary | ICD-10-CM

## 2016-09-05 DIAGNOSIS — R918 Other nonspecific abnormal finding of lung field: Secondary | ICD-10-CM | POA: Diagnosis not present

## 2016-09-05 MED ORDER — IOPAMIDOL (ISOVUE-300) INJECTION 61%
75.0000 mL | Freq: Once | INTRAVENOUS | Status: AC | PRN
Start: 2016-09-05 — End: 2016-09-05
  Administered 2016-09-05: 75 mL via INTRAVENOUS

## 2016-09-08 ENCOUNTER — Ambulatory Visit: Payer: PPO | Attending: Internal Medicine | Admitting: Physical Therapy

## 2016-09-08 ENCOUNTER — Encounter: Payer: Self-pay | Admitting: Physical Therapy

## 2016-09-08 DIAGNOSIS — R296 Repeated falls: Secondary | ICD-10-CM | POA: Diagnosis present

## 2016-09-08 DIAGNOSIS — R2681 Unsteadiness on feet: Secondary | ICD-10-CM | POA: Insufficient documentation

## 2016-09-08 NOTE — Therapy (Signed)
[facility] [address] [place], [place], [address] Phone: [phone]   Fax:  [phone]  Physical Therapy Evaluation  Patient Details  Name: Justin Gomez MRN: [id number] Date of Birth: 04-Feb-1941 Referring Provider: Jani Gravel, MD  Encounter Date: 09/08/2016      PT End of Session - 09/08/16 1449    Visit Number 1   Number of Visits 4   Date for PT Re-Evaluation 10/08/16   Authorization Type [employer] (Medicare) G code and progress note every 10th visit   PT Start Time 1105   PT Stop Time 1143   PT Time Calculation (min) 38 min   Activity Tolerance Patient tolerated treatment well   Behavior During Therapy [facility] for tasks assessed/performed      Past Medical History:  Diagnosis Date  . BPH (benign prostatic hypertrophy)   . Diabetes mellitus   . Diverticulosis   . Gastroesophageal reflux disease 04/13/2016  . Hiatal hernia   . Hyperlipidemia   . Hypertension   . Hypothyroidism   . Lumbar herniated disc    L4    Past Surgical History:  Procedure Laterality Date  . APPENDECTOMY  1956  . KNEE SURGERY  2009   right  . SHOULDER SURGERY  2010   left    There were no vitals filed for this visit.       Subjective Assessment - 09/08/16 1112    Subjective Pt presents to OPPT s/p fall at night while ambulating to bathroom in the dark and reports hitting his head.  Fell onto L side.  Has imaging of head/brain scheduled on April 1st.  Does not report any difference since the fall.     Pertinent History diabetes with peripheral neuropathy in feet-numbness   Patient Stated Goals Work on his balance   Currently in Pain? No/denies            [facility] PT Assessment - 09/08/16 1117      Assessment   Medical Diagnosis Falls   Referring Provider Jani Gravel, MD   Onset Date/Surgical Date 07/20/16   Next MD Visit April 1st, 2018   Prior Therapy OPPT for foot drop-resolved spontaneously     Precautions    Precautions Fall     Balance Screen   Has the patient fallen in the past 6 months Yes   How many times? 1   Has the patient had a decrease in activity level because of a fear of falling?  No   Is the patient reluctant to leave their home because of a fear of falling?  No     Home [employer] residence   Living Arrangements Spouse/significant other   Available Help at Discharge Family   Type of [place] entrance   [place] One level   [place] None     Prior Function   Level of Independence Independent     Observation/Other Assessments   Focus on Therapeutic Outcomes (FOTO)  did not complete     Sensation   Light Touch Impaired by gross assessment   Additional Comments numbness on plantar surface of bilat feet, peripheral neuropathy     ROM / Strength   AROM / PROM / Strength Strength     Strength   Overall Strength Within functional limits for tasks performed     Ambulation/Gait   Gait velocity 8 seconds or 4.1 ft/sec     Functional Gait  Assessment   Gait assessed  Yes   Gait Level Surface Walks 20 ft in less than 5.5 sec, no assistive devices, good speed, no evidence for imbalance, normal gait pattern, deviates no more than 6 in outside of the 12 in walkway width.   Change in Gait Speed Able to smoothly change walking speed without loss of balance or gait deviation. Deviate no more than 6 in outside of the 12 in walkway width.   Gait with Horizontal Head Turns Performs head turns smoothly with no change in gait. Deviates no more than 6 in outside 12 in walkway width   Gait with Vertical Head Turns Performs head turns with no change in gait. Deviates no more than 6 in outside 12 in walkway width.   Gait and Pivot Turn Pivot turns safely within 3 sec and stops quickly with no loss of balance.   Step Over Obstacle Is able to step over 2 stacked shoe boxes taped together (9 in total height) without changing gait  speed. No evidence of imbalance.   Gait with Narrow Base of Support Ambulates 7-9 steps.   Gait with Eyes Closed Walks 20 ft, slow speed, abnormal gait pattern, evidence for imbalance, deviates 10-15 in outside 12 in walkway width. Requires more than 9 sec to ambulate 20 ft.   Ambulating Backwards Walks 20 ft, no assistive devices, good speed, no evidence for imbalance, normal gait   Steps Alternating feet, must use rail.   Total Score 26   FGA comment: 26/30 low falls risk            PT Education - 09/08/16 1142    Education provided Yes   Education Details clinical findings, PT POC and goals, community wellness options; also educated on impaired balance in dark environments and use of night light or flash light when getting up to go to bathroom   Person(s) Educated Patient   Methods Explanation   Comprehension Verbalized understanding             PT Long Term Goals - 09/08/16 1458      PT LONG TERM GOAL #1   Title (TARGET DATE FOR ALL LTG 10/08/16) Pt will be independent with balance HEP   Time 4   Period Weeks   Status New     PT LONG TERM GOAL #2   Title Pt will improve SLS each leg to 10 seconds for improved stair and obstacle negotiation.   Time 4   Period Weeks   Status New     PT LONG TERM GOAL #3   Title Pt will improve gait velocity to >4.3 ft/sec    Baseline 4.1 ft/sec   Time 4   Period Weeks   Status New     PT LONG TERM GOAL #4   Title Pt will improve FGA to >28/30 with improvement in ability to maintain balance with decreased visual input   Baseline 26/30   Time 4   Period Weeks   Status New     PT LONG TERM GOAL #5   Title Pt will participate in SOT assessment to determine % use of each balance system   Time 4   Period Weeks   Status New               Plan - 09/08/16 1451    Clinical Impression Statement Pt is a 76 year old male presenting to OPPT neuro for low complexity PT evaluation for imbalance and falls. Pt's PMH significant  for the following: diabetes, peripheral neuropathy with h/o foot drop, HTN, lumbar HNP. The following deficits were noted during pt's exam: impaired balance and balance reactions. Pt's gait speed indicates pt is safe to ambulate in the community but is below average for normal gait speed. Pt's FGA score indicates pt is at low risk for falls but has increased LOB and is unsafe when ambulating with decreased visual input. Pt would benefit from skilled PT to address these impairments and functional limitations to maximize functional mobility independence and reduce falls risk.   Rehab Potential Excellent   PT Frequency 1x / week   PT Duration 4 weeks   PT Treatment/Interventions ADLs/Self Care Home Management;Gait training;Stair training;Functional mobility training;Therapeutic activities;Therapeutic exercise;Balance training;Neuromuscular re-education;Patient/family education   PT Next Visit Plan assess single leg stance time on each LE, SOT assessment, HEP focusing on higher level balance training; education on safety at home and use of lighting when getting up at night.   Consulted and Agree with Plan of Care Patient      Patient will benefit from skilled therapeutic intervention in order to improve the following deficits and impairments:  Decreased balance, Impaired sensation  Visit Diagnosis: Unsteadiness on feet  Repeated falls      G-Codes - 09/20/2016 1502    Functional Assessment Tool Used (Outpatient Only) FGA   Functional Limitation Mobility: Walking and moving around   Mobility: Walking and Moving Around Current Status (D6644) At least 1 percent but less than 20 percent impaired, limited or restricted   Mobility: Walking and Moving Around Goal Status (I3474) 0 percent impaired, limited or restricted       Problem List Patient Active Problem List   Diagnosis Date Noted  . Gastroesophageal reflux disease 04/13/2016  . Cough 04/13/2016  . 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 Essential hypertension 11/29/2015  . Type 2 diabetes mellitus without complication, without long-term current use of insulin ([place]) 11/29/2015    Raylene Everts, PT, DPT 09/20/16    3:06 PM    [facility] [address] [place], [place], [address] Phone: [phone]   Fax:  [phone]  Name: VANESSA ALESI MRN: [id number] Date of Birth: [birth year]

## 2016-09-11 ENCOUNTER — Other Ambulatory Visit: Payer: PPO

## 2016-09-11 DIAGNOSIS — E1165 Type 2 diabetes mellitus with hyperglycemia: Secondary | ICD-10-CM | POA: Diagnosis not present

## 2016-09-11 DIAGNOSIS — E039 Hypothyroidism, unspecified: Secondary | ICD-10-CM | POA: Diagnosis not present

## 2016-09-11 DIAGNOSIS — R05 Cough: Secondary | ICD-10-CM | POA: Diagnosis not present

## 2016-09-11 DIAGNOSIS — I10 Essential (primary) hypertension: Secondary | ICD-10-CM | POA: Diagnosis not present

## 2016-09-15 ENCOUNTER — Ambulatory Visit
Admission: RE | Admit: 2016-09-15 | Discharge: 2016-09-15 | Disposition: A | Discharge status: Home / Self Care | Payer: PPO | Source: Ambulatory Visit | Attending: Internal Medicine | Admitting: Internal Medicine

## 2016-09-15 DIAGNOSIS — R51 Headache: Principal | ICD-10-CM

## 2016-09-15 DIAGNOSIS — R519 Headache, unspecified: Secondary | ICD-10-CM

## 2016-09-17 ENCOUNTER — Other Ambulatory Visit: Payer: PPO

## 2016-09-26 ENCOUNTER — Encounter: Payer: Self-pay | Admitting: Physical Therapy

## 2016-09-26 ENCOUNTER — Ambulatory Visit: Payer: PPO | Attending: Internal Medicine | Admitting: Physical Therapy

## 2016-09-26 DIAGNOSIS — M6281 Muscle weakness (generalized): Secondary | ICD-10-CM | POA: Diagnosis present

## 2016-09-26 DIAGNOSIS — R296 Repeated falls: Secondary | ICD-10-CM | POA: Diagnosis present

## 2016-09-26 DIAGNOSIS — R2681 Unsteadiness on feet: Secondary | ICD-10-CM | POA: Diagnosis not present

## 2016-09-26 DIAGNOSIS — R2689 Other abnormalities of gait and mobility: Secondary | ICD-10-CM | POA: Diagnosis present

## 2016-09-26 NOTE — Patient Instructions (Signed)
Copyright  VHI. All rights reserved.  Feet Together, Head Motion - Eyes Closed      With eyes closed and feet together, move head slowly, up and down 10 times, side to side 10 times. Repeat 1 times per session. Do 2 sessions per day.  Copyright  VHI. All rights reserved.  Feet Partial Heel-Toe, Head Motion - Eyes Closed   With eyes open and right foot partially in front of the other; hold 12 seconds and then switch legs.  Hold 12 seconds. When you are able to hold without losing your balance practice move head slowly, up and down and side to side 10 times each. Repeat 1 times per session. Do 2 sessions per day.  Copyright  VHI. All rights reserved.  Feet Apart (Compliant Surface) Head Motion - Eyes Closed    Stand on compliant surface: pillow with feet shoulder width apart. Close eyes and move head slowly, up and down and then side to side 10 times each. Repeat 1 times per session. Do 2 sessions per day.     Standing Marching   Using a chair if necessary, march in place. Repeat 15 times. Do 2 sessions per day.  http://gt2.exer.us/344   Copyright  VHI. All rights reserved.   Hip Backward Kick   Using a chair for balance, keep legs shoulder width apart and toes pointed for- ward. Slowly extend one leg back, keeping knee straight. Do not lean forward. Repeat with other leg. Repeat 15 times. Do 2 sessions per day.  http://gt2.exer.us/340   Copyright  VHI. All rights reserved.     Hip Side Kick   Holding a chair for balance, keep legs shoulder width apart and toes pointed forward. Swing a leg out to side, keeping knee straight. Do not lean. Repeat using other leg. Repeat 15 times. Do 2 sessions per day.   Standing On One Leg Without Support .  Stand on one leg in neutral spine without support. Hold 12 seconds. Repeat on other leg. Do 2 repetitions, 2 sets per day.  http://bt.exer.us/36   Copyright  VHI. All rights reserved.

## 2016-09-26 NOTE — Therapy (Signed)
[facility] [address] [place], [place], [address] Phone: [phone]   Fax:  [phone]  Physical Therapy Treatment  Patient Details  Name: Justin Gomez MRN: [id number] Date of Birth: 13-Jul-1940 Referring Provider: Pearson Grippe, MD  Encounter Date: 09/26/2016      PT End of Session - 09/26/16 1028    Visit Number 2   Number of Visits 4  D/C today per pt wishes   Date for PT Re-Evaluation 10/08/16   Authorization Type [employer] (Medicare) G code and progress note every 10th visit   PT Start Time 0938   PT Stop Time 1022   PT Time Calculation (min) 44 min   Activity Tolerance Patient tolerated treatment well   Behavior During Therapy [facility] for tasks assessed/performed      Past Medical History:  Diagnosis Date  . BPH (benign prostatic hypertrophy)   . Diabetes mellitus   . Diverticulosis   . Gastroesophageal reflux disease 04/13/2016  . Hiatal hernia   . Hyperlipidemia   . Hypertension   . Hypothyroidism   . Lumbar herniated disc    L4    Past Surgical History:  Procedure Laterality Date  . APPENDECTOMY  1956  . KNEE SURGERY  2009   right  . SHOULDER SURGERY  2010   left    There were no vitals filed for this visit.      Subjective Assessment - 09/26/16 0940    Subjective No issues or falls since evaluation; had CT of chest and MRI of head.  No issues on MRI but impression of CT scan discussed possible PNA-pt unaware.   Pertinent History diabetes with peripheral neuropathy in feet-numbness   Patient Stated Goals Work on his balance   Currently in Pain? No/denies          [facility] Adult PT Treatment/Exercise - 09/26/16 0947      Standardized Balance Assessment   Standardized Balance Assessment Balance Master Testing  SOT composite score of 80-WNL          Balance Exercises - 09/26/16 1023      Balance Exercises: Standing   Standing Eyes Closed Wide (BOA);Foam/compliant surface;Head  turns;1 rep;10 secs;Narrow base of support (BOS);Solid surface  solid, eyes closed-head turns, compliant feet apart   Tandem Stance Eyes open;1 rep;10 secs  solid surface in corner, 1 rep each R/L foot forwards   SLS Eyes open;Upper extremity support 2;1 rep;Other (comment)  12 second hold each LE   Marching Limitations bilat UE support x 15 reps alternating   Other Standing Exercises standing alternating hip extension x 15 reps bilat UE support     OTAGO PROGRAM   Hip ABductor Other reps (comment)  15 reps, bilat UE support           PT Education - 09/26/16 1027    Education provided Yes   Education Details HEP; D/C from therapy per pt wishes.  Recommended pt return if he has recurrent LOB or falls.    Person(s) Educated Patient   Methods Explanation;Demonstration;Handout   Comprehension Verbalized understanding;Returned demonstration             PT Long Term Goals - 09/26/16 1032      PT LONG TERM GOAL #1   Title (TARGET DATE FOR ALL LTG 10/08/16) Pt will be independent with balance HEP   Status Achieved     PT LONG TERM GOAL #2   Title Pt will improve SLS each leg to 10 seconds  for improved stair and obstacle negotiation.   Status Not Met     PT LONG TERM GOAL #3   Title Pt will improve gait velocity to >4.3 ft/sec    Status Unable to assess     PT LONG TERM GOAL #4   Title Pt will improve FGA to >28/30 with improvement in ability to maintain balance with decreased visual input   Status Unable to assess     PT LONG TERM GOAL #5   Title Pt will participate in SOT assessment to determine % use of each balance system   Status Achieved               Plan - 10-11-16 1029    Clinical Impression Statement Pt returns for second visit.  Pt has been using night light at night when getting up to use bathroom with no further LOB or falls; pt feels back to normal.  Pt performed SOT testing to assess use of all balance systems with pt composite score of 80-WNL.  Pt  did wish to have exercises to work on at home for balance.  Pt provided with balance and LE strengthening HEP.  Recommended that pt perform exercises x 1 week and return for one more visit but pt requesting to D/C from therapy today.  Recommended for pt to return for further therapy if he begins to experience reoccuring LOB or falls.  Pt agrees.  Unable to re-assess all LTG today; pt met 2/5 LTG.    PT Treatment/Interventions ADLs/Self Care Home Management;Gait training;Stair training;Functional mobility training;Therapeutic activities;Therapeutic exercise;Balance training;Neuromuscular re-education;Patient/family education   PT Next Visit Plan D/C patient today per pt wishes   Consulted and Agree with Plan of Care Patient      Patient will benefit from skilled therapeutic intervention in order to improve the following deficits and impairments:  Decreased balance, Impaired sensation  Visit Diagnosis: Unsteadiness on feet  Repeated falls  Other abnormalities of gait and mobility  Muscle weakness (generalized)       G-Codes - 10/11/2016 1033    Functional Assessment Tool Used (Outpatient Only) SOT,    Functional Limitation Mobility: Walking and moving around   Mobility: Walking and Moving Around Goal Status [phone]) 0 percent impaired, limited or restricted   Mobility: Walking and Moving Around Discharge Status [phone]) At least 1 percent but less than 20 percent impaired, limited or restricted      Problem List Patient Active Problem List   Diagnosis Date Noted  . Gastroesophageal reflux disease 04/13/2016  . Cough 04/13/2016  . 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 Essential hypertension 11/29/2015  . Type 2 diabetes mellitus without complication, without long-term current use of insulin ([place]) 11/29/2015   PHYSICAL THERAPY DISCHARGE SUMMARY  Visits from Start of Care: 2  Current functional level related to goals / functional outcomes: See  above   Remaining deficits: Balance   Education / Equipment: HEP  Plan: Patient agrees to discharge.  Patient goals were not met. Patient is being discharged due to the patient's request.  ?????     Raylene Everts, PT, DPT 10/11/16    10:35 AM    [facility] [address] [place], [place], [address] Phone: [phone]   Fax:  [phone]  Name: Justin Gomez MRN: [id number] Date of Birth: Mar 27, 1941

## 2016-10-04 ENCOUNTER — Encounter: Payer: PPO | Admitting: Physical Therapy

## 2016-10-09 ENCOUNTER — Encounter: Payer: PPO | Admitting: Physical Therapy

## 2016-10-17 ENCOUNTER — Other Ambulatory Visit: Payer: Self-pay | Admitting: Gastroenterology

## 2016-10-17 DIAGNOSIS — M21371 Foot drop, right foot: Secondary | ICD-10-CM

## 2016-10-18 ENCOUNTER — Encounter: Payer: PPO | Admitting: Physical Therapy

## 2016-11-23 DIAGNOSIS — R32 Unspecified urinary incontinence: Secondary | ICD-10-CM | POA: Diagnosis not present

## 2016-11-23 DIAGNOSIS — R42 Dizziness and giddiness: Secondary | ICD-10-CM | POA: Diagnosis not present

## 2016-11-23 DIAGNOSIS — R2681 Unsteadiness on feet: Secondary | ICD-10-CM | POA: Diagnosis not present

## 2016-11-25 ENCOUNTER — Other Ambulatory Visit: Payer: Self-pay | Admitting: Gastroenterology

## 2016-11-25 DIAGNOSIS — M21371 Foot drop, right foot: Secondary | ICD-10-CM

## 2016-12-19 ENCOUNTER — Other Ambulatory Visit: Payer: Self-pay | Admitting: Internal Medicine

## 2016-12-19 DIAGNOSIS — R059 Cough, unspecified: Secondary | ICD-10-CM

## 2016-12-19 DIAGNOSIS — R05 Cough: Secondary | ICD-10-CM

## 2016-12-21 ENCOUNTER — Inpatient Hospital Stay
Admission: RE | Admit: 2016-12-21 | Discharge: 2016-12-21 | Disposition: A | Discharge status: Home / Self Care | Payer: PPO | Source: Ambulatory Visit | Attending: Internal Medicine | Admitting: Internal Medicine

## 2016-12-22 ENCOUNTER — Ambulatory Visit
Admission: RE | Admit: 2016-12-22 | Discharge: 2016-12-22 | Disposition: A | Discharge status: Home / Self Care | Payer: PPO | Source: Ambulatory Visit | Attending: Internal Medicine | Admitting: Internal Medicine

## 2016-12-22 DIAGNOSIS — R059 Cough, unspecified: Secondary | ICD-10-CM

## 2016-12-22 DIAGNOSIS — R05 Cough: Secondary | ICD-10-CM

## 2016-12-22 DIAGNOSIS — R918 Other nonspecific abnormal finding of lung field: Secondary | ICD-10-CM | POA: Diagnosis not present

## 2017-01-04 DIAGNOSIS — E1165 Type 2 diabetes mellitus with hyperglycemia: Secondary | ICD-10-CM | POA: Diagnosis not present

## 2017-01-04 DIAGNOSIS — I10 Essential (primary) hypertension: Secondary | ICD-10-CM | POA: Diagnosis not present

## 2017-01-09 DIAGNOSIS — R3912 Poor urinary stream: Secondary | ICD-10-CM | POA: Diagnosis not present

## 2017-01-09 DIAGNOSIS — R972 Elevated prostate specific antigen [PSA]: Secondary | ICD-10-CM | POA: Diagnosis not present

## 2017-01-09 DIAGNOSIS — N3941 Urge incontinence: Secondary | ICD-10-CM | POA: Diagnosis not present

## 2017-01-09 DIAGNOSIS — R350 Frequency of micturition: Secondary | ICD-10-CM | POA: Diagnosis not present

## 2017-01-11 DIAGNOSIS — E039 Hypothyroidism, unspecified: Secondary | ICD-10-CM | POA: Diagnosis not present

## 2017-01-11 DIAGNOSIS — R05 Cough: Secondary | ICD-10-CM | POA: Diagnosis not present

## 2017-01-11 DIAGNOSIS — Z125 Encounter for screening for malignant neoplasm of prostate: Secondary | ICD-10-CM | POA: Diagnosis not present

## 2017-01-11 DIAGNOSIS — I10 Essential (primary) hypertension: Secondary | ICD-10-CM | POA: Diagnosis not present

## 2017-01-11 DIAGNOSIS — E1165 Type 2 diabetes mellitus with hyperglycemia: Secondary | ICD-10-CM | POA: Diagnosis not present

## 2017-01-22 ENCOUNTER — Encounter (INDEPENDENT_AMBULATORY_CARE_PROVIDER_SITE_OTHER): Payer: Self-pay

## 2017-01-22 ENCOUNTER — Ambulatory Visit: Payer: PPO | Admitting: Neurology

## 2017-01-22 ENCOUNTER — Encounter: Payer: Self-pay | Admitting: Neurology

## 2017-01-22 ENCOUNTER — Ambulatory Visit (INDEPENDENT_AMBULATORY_CARE_PROVIDER_SITE_OTHER): Payer: PPO | Admitting: Neurology

## 2017-01-22 VITALS — BP 165/77 | HR 63 | Ht 72.0 in | Wt 182.8 lb

## 2017-01-22 DIAGNOSIS — I10 Essential (primary) hypertension: Secondary | ICD-10-CM

## 2017-01-22 DIAGNOSIS — I951 Orthostatic hypotension: Secondary | ICD-10-CM

## 2017-01-22 DIAGNOSIS — E119 Type 2 diabetes mellitus without complications: Secondary | ICD-10-CM | POA: Diagnosis not present

## 2017-01-22 DIAGNOSIS — G5701 Lesion of sciatic nerve, right lower limb: Secondary | ICD-10-CM

## 2017-01-22 HISTORY — DX: Orthostatic hypotension: I95.1

## 2017-01-22 NOTE — Patient Instructions (Signed)
-   continue ASA for stroke prevention - check the BP at home 1-2 time a day, roughly same time of the day, and write down. If BP always high or low, can PCP for medication adjustment if needed. - check glucose at home and record - Follow up with Dr. Maudie Mercury regularly - if dizzy spell happens again, sit down or lie down, and check BP and glucose right away - keep hydrated - get up slowly, make sure not dizzy before walking - avoid fall - follow up as needed.

## 2017-01-22 NOTE — Progress Notes (Signed)
NEUROLOGY CLINIC NEW PATIENT NOTE  NAME: Justin Gomez DOB: 05-27-1941 REFERRING PHYSICIAN: Jani Gravel, MD  I saw [name] as a new consult in the neurovascular clinic today regarding  Chief Complaint  Patient presents with  . Follow-up    Patient is here for dizzy spells referral from Dr Maudie Mercury when he stands he gets dizzy, Pt fell in March and MRI, and had dizzy spells for 2 to 3 days but it cleared it  .  HPI: Justin Gomez is a 76 y.o. male with PMH of HTN and DM who presents as a new patient for right foot drop.   Patient stated that about 3 weeks ago, he noticed right foot drop, not able to dorsiflex his right foot. He has to result his right leg for walking. Denies any pain, no ankle sprain, felt some numbness tingling at the bottom right foot. No numbness at right leg, no back pain. Went to see PCP Dr. Maudie Mercury on 11/19/2015, and referred to neurology for further evaluation.  He denies any headache, facial droop, arm weakness, speech difficulty or visual changes. He denies any compression on the right foot or leg, recent surgery, or recent dramatic weight loss. He does admit that he frequently cross his legs.   Her history hypertension and diabetes, however, there very well controlled. Blood pressure today 109/68 and his recent A1c 6.2. He denies any cigarette smoking, or illicit drugs. He stated that he drinks 2 glass of wine every week.  02/07/16 follow up - he had EMG/NCS at the end of June showed active and chronic denervation at right common peroneal N, consistent with common peroneal neuropathy. He follows with PT/OT for therapy and has not get the brace yet. One week ago, his right foot drop started to improve and for the last week, he can do more dorsiflexion and now his walking also improved with less steppage walking. BP 109/63 and glucose at home 95-115.   Interval history: During the interval time, patient has been doing well. Right foot drop resolved, no back to  normal. However, patient complains of one fall in the middle of night while going to bathroom in 08/2016. Hit head, not hard, not LOC. Went to see PCP, CT/MRI brain negative for acute stroke. In 10/2016, he had one episode of lightheadedness, woozy and imbalance or standing up. Did not check BP or glucose at the time. After sitting down, symptoms resolved. He was put on meclizine PRN. Since then, no more similar spells. BP today 165/77.   Past Medical History:  Diagnosis Date  . BPH (benign prostatic hypertrophy)   . Diabetes mellitus   . Diverticulosis   . Gastroesophageal reflux disease 04/13/2016  . Hiatal hernia   . Hyperlipidemia   . Hypertension   . Hypothyroidism   . Lumbar herniated disc    L4   Past Surgical History:  Procedure Laterality Date  . APPENDECTOMY  1956  . KNEE SURGERY  2009   right  . SHOULDER SURGERY  2010   left   Family History  Problem Relation Age of Onset  . Cerebral aneurysm Father   . Hypertension Father   . Diabetes Mother   . Hypertension Brother   . Diabetes Paternal Grandmother   . Colon cancer Neg Hx    Current Outpatient Prescriptions  Medication Sig Dispense Refill  . aspirin 81 MG tablet Take 81 mg by mouth daily.     . finasteride (PROSCAR) 5 MG tablet Take 5  mg by mouth daily.    Marland Kitchen glipiZIDE (GLUCOTROL) 5 MG tablet     . levothyroxine (SYNTHROID, LEVOTHROID) 75 MCG tablet Take 75 mcg by mouth daily.    Marland Kitchen losartan (COZAAR) 25 MG tablet Take 25 mg by mouth daily.    . metFORMIN (GLUCOPHAGE-XR) 750 MG 24 hr tablet     . mirabegron ER (MYRBETRIQ) 25 MG TB24 tablet Take 25 mg by mouth daily.    Marland Kitchen omeprazole (PRILOSEC) 40 MG capsule TAKE 1 CAPSULE(40 MG) BY MOUTH TWICE DAILY BEFORE A MEAL 60 capsule 0  . tamsulosin (FLOMAX) 0.4 MG CAPS capsule Take 0.4 mg by mouth daily.     No current facility-administered medications for this visit.    No Known Allergies Social History   Social History  . Marital status: Married    Spouse name: N/A   . Number of children: 3  . Years of education: N/A   Occupational History  . business executive    Social History Main Topics  . Smoking status: Never Smoker  . Smokeless tobacco: Never Used  . Alcohol use 1.8 oz/week    2 Standard drinks or equivalent, 1 Glasses of wine per week     Comment: Rarely  . Drug use: No  . Sexual activity: Not on file   Other Topics Concern  . Not on file   Social History Narrative  . No narrative on file    Review of Systems Full 14 system review of systems performed and notable only for those listed, all others are neg:  Constitutional:   Cardiovascular:  Ear/Nose/Throat:   Skin:  Eyes:   Respiratory:   Gastroitestinal:   Genitourinary:  Hematology/Lymphatic:   Endocrine:  Musculoskeletal:   Allergy/Immunology:   Neurological:   Psychiatric:  Sleep:    Physical Exam  Vitals:   01/22/17 0850  BP: (!) 165/77  Pulse: 63    General - Well nourished, well developed, in no apparent distress.  Ophthalmologic - Sharp disc margins OU.  Cardiovascular - Regular rate and rhythm.   Neck - supple, no nuchal rigidity .  Mental Status -  Level of arousal and orientation to time, place, and person were intact. Language including expression, naming, repetition, comprehension, reading, and writing was assessed and found intact. Fund of Knowledge was assessed and was intact.  Cranial Nerves II - XII - II - Visual field intact OU. III, IV, VI - Extraocular movements intact. V - Facial sensation intact bilaterally. VII - Facial movement intact bilaterally. VIII - Hearing & vestibular intact bilaterally. X - Palate elevates symmetrically. XI - Chin turning & shoulder shrug intact bilaterally. XII - Tongue protrusion intact.  Motor Strength - The patient's strength was normal in all extremities and pronator drift was absent.  Bulk was normal and fasciculations were absent.   Motor Tone - Muscle tone was assessed at the neck and  appendages and was normal.  Reflexes - The patient's reflexes were normal in all extremities and he had no pathological reflexes.  Sensory - Light touch, temperature/pinprick were assessed and were normal.    Coordination - The patient had normal movements in the hands and feet with no ataxia or dysmetria.  Tremor was absent.  Gait and Station - stooped posturing, small stride, but steady.   Imaging  EMG -  1. 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 2. Right tibial and left peroneal motor responses have decreased amplitudes. May be due to right S1 and left L5  radiculopathies.  3. No definite evidence of widespread underlying polyneuropathy.  MRI brain 09/15/16  1. No acute intracranial process. 2. Borderline advanced parenchymal brain volume loss for age.  Lab Review none   Assessment:   In summary, Justin Gomez is a 76 y.o. male with PMH of HTN and DM presents right foot drop in 11/2015. Exam then showed right foot 0/5 dorsiflex and 4/5 eversion with right proximal big toe decreased sensation, consistent with right common peritoneal N. Injury. 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 Only risk factor so far is frequent leg crossing. Right foot drop gradually resolved and now full strength. Pt complains of one episode of falling at night while going to bathroom in 08/2016, MRI brain neg. One episode of dizziness on standing up, resolved after sitting down, consistent with postural hypotension. Recommend  BP monitoring, hydration and fall precaution.   Plan: - avoid cross legs - continue ASA for stroke prevention - check the BP at home 1-2 time a day, roughly same time of the day, and write down and call PCP for medication adjustment if needed. - check glucose at home and record - Follow up with Dr. Maudie Mercury regularly - if dizzy spell happens again, sit down or lie down, and check BP and  glucose right away - keep hydrated, get up slowly, and avoid fall - follow up as needed.   I spent more than 25 minutes of face to face time with the patient. Greater than 50% of time was spent in counseling and coordination of care.   No orders of the defined types were placed in this encounter.   Meds ordered this encounter  Medications  . losartan (COZAAR) 25 MG tablet    Sig: Take 25 mg by mouth daily.  . mirabegron ER (MYRBETRIQ) 25 MG TB24 tablet    Sig: Take 25 mg by mouth daily.    Patient Instructions  - continue ASA for stroke prevention - check the BP at home 1-2 time a day, roughly same time of the day, and write down. If BP always high or low, can PCP for medication adjustment if needed. - check glucose at home and record - Follow up with Dr. Maudie Mercury regularly - if dizzy spell happens again, sit down or lie down, and check BP and glucose right away - keep hydrated - get up slowly, make sure not dizzy before walking - avoid fall - follow up as needed.     Rosalin Hawking, MD PhD [facility] [address] [place], [place] [address] [phone]

## 2017-02-27 ENCOUNTER — Encounter: Payer: Self-pay | Admitting: Family Medicine

## 2017-02-27 ENCOUNTER — Ambulatory Visit (INDEPENDENT_AMBULATORY_CARE_PROVIDER_SITE_OTHER): Payer: PPO | Admitting: Family Medicine

## 2017-02-27 VITALS — BP 124/70 | HR 80 | Temp 97.7°F | Resp 16

## 2017-02-27 DIAGNOSIS — R05 Cough: Secondary | ICD-10-CM

## 2017-02-27 DIAGNOSIS — J310 Chronic rhinitis: Secondary | ICD-10-CM

## 2017-02-27 DIAGNOSIS — R1319 Other dysphagia: Secondary | ICD-10-CM

## 2017-02-27 DIAGNOSIS — K219 Gastro-esophageal reflux disease without esophagitis: Secondary | ICD-10-CM

## 2017-02-27 DIAGNOSIS — R059 Cough, unspecified: Secondary | ICD-10-CM

## 2017-02-27 MED ORDER — PANTOPRAZOLE SODIUM 40 MG PO TBEC: 40.0000 mg | DELAYED_RELEASE_TABLET | Freq: Every day | ORAL | 3 refills | Status: DC

## 2017-02-27 MED ORDER — RANITIDINE HCL 300 MG PO TABS: 300.0000 mg | ORAL_TABLET | Freq: Every day | ORAL | 5 refills | Status: DC

## 2017-02-27 NOTE — Patient Instructions (Addendum)
Cough:   Use albuterol inhaler for any wheezing or shortness of breath 1-2 puffs every 4-6 hours as needed  Gastroesophageal reflux disease, esophagitis presence not specified  Begin taking pantaprazole 40 mg every morning  Begin taking Zantac 300mg  every evening  Continue to avoid caffeine and chocolate  Raise the head of the bed   Mechanical dysphagia  Take small bites  Clear your throat between bites.   Swallow twice between bites  Eat meals and snacks while sitting upright   Chronic rhinitis  Begin usine Flonase 1 spray per nostril every morning  Begin using Claritin 10 mg a day  Begin Montelukast 10 mg a day  Mold avoidance  Return in about 4 weeks (around 03/27/2017).    Please inform us of any Emergency Department visits, hospitalizations, or changes in symptoms. Call us before going to the ED for breathing or allergy symptoms since we might be able to fit you in for a sick visit. Feel free to contact us anytime with any questions, problems, or concerns.  It was a pleasure to meet you today! Enjoy the upcoming fall season!  Websites that have reliable patient information: 1. American Academy of Asthma, Allergy, and Immunology: www.aaaai.org 2. Food Allergy Research and Education (FARE): foodallergy.org 3. Mothers of Asthmatics: http://www.asthmacommunitynetwork.org 4. American College of Allergy, Asthma, and Immunology: www.acaai.org

## 2017-02-27 NOTE — Progress Notes (Addendum)
NEW PATIENT   Date of Service/Encounter:  02/27/17  Referring provider: Jani Gravel, MD   Assessment:   Cough  Gastroesophageal reflux disease, esophagitis presence not specified  Mechanical dysphagia  Chronic rhinitis   Seasonal Influenza Vaccine: no but encouraged    Plan/Recommendations:    Patient Instructions  Cough:   Use albuterol inhaler for any wheezing or shortness of breath 1-2 puffs every 4-6 hours as needed  Continue to use cough drops  Gastroesophageal reflux disease, esophagitis presence not specified  Begin taking pantaprazole 40 mg every morning  Begin taking Zantac 300mg  every evening  Continue to avoid caffeine and chocolate  Raise the head of the bed   Mechanical dysphagia  Take small bites  Clear your throat between bites.   Swallow twice between bites  Chronic rhinitis  Begin usine Flonase 1 spray per nostril every morning  Begin using Claritin 10 mg a day  Begin Montelukast 10 mg a day  Follow up 4 weeks      Subjective:   Justin Gomez is a 76 y.o. male presenting today for evaluation of cough while eating.   Justin Gomez has a history of the following: Patient Active Problem List   Diagnosis Date Noted  . Postural hypotension 01/22/2017  . Gastroesophageal reflux disease 04/13/2016  . Cough 04/13/2016  . 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 Essential hypertension 11/29/2015  . Type 2 diabetes mellitus without complication, without long-term current use of insulin ([place]) 11/29/2015    History obtained from: chart review and patient interview  Justin Gomez was referred by Justin Gravel, MD.     Justin Gomez is a 76 y.o. male presenting for chronic cough. He reports the cough begins when he sits down to eat or when he takes large pills and has been present and bothersome for about the last year. He reports a clear nasal drainage from both nares while sitting down to eat.  Alleviating factors include [place]ola cough drop and clearing his throat. He reports taking omeprazole for a short period of time which he feels exacerbated the cough. He continues to avoid caffeine and chocolate.  He denies shortness of breath, wheezing, activity limitation, nighttime symptoms,eye itch or drainage, and sneezing. He reports moving to a new residence 1.5 years ago and his daughter has introduced cats to his home about 3 months ago. Patient reports he had some gastrointestinal testing and review of chart revels an upper GI series in 04/2014 showing a small sliding hiatal hernia and anterior osteophytes compressing the esophagus..   Otherwise, there is no history of other atopic diseases, including asthma, drug allergies, food allergies, environmental allergies, stinging insect allergies, or urticaria. There is no significant infectious history.    Past Medical History: Patient Active Problem List   Diagnosis Date Noted  . Postural hypotension 01/22/2017  . Gastroesophageal reflux disease 04/13/2016  . Cough 04/13/2016  . 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 Essential hypertension 11/29/2015  . Type 2 diabetes mellitus without complication, without long-term current use of insulin ([place]) 11/29/2015    Medication List:  Allergies as of 02/27/2017   No Known Allergies     Medication List       Accurate as of 02/27/17  4:05 PM. Always use your most recent med list.          aspirin 81 MG tablet Take 81 mg by mouth daily.   finasteride 5 MG  tablet Commonly known as:  PROSCAR Take 5 mg by mouth daily.   FLOMAX 0.4 MG Caps capsule Generic drug:  tamsulosin Take 0.4 mg by mouth daily.   glipiZIDE 5 MG tablet Commonly known as:  GLUCOTROL   levothyroxine 75 MCG tablet Commonly known as:  SYNTHROID, LEVOTHROID Take 75 mcg by mouth daily.   losartan 25 MG tablet Commonly known as:  COZAAR Take 25 mg by mouth daily.   metFORMIN 750  MG 24 hr tablet Commonly known as:  GLUCOPHAGE-XR   MYRBETRIQ 25 MG Tb24 tablet Generic drug:  mirabegron ER Take 25 mg by mouth daily.   omeprazole 40 MG capsule Commonly known as:  PRILOSEC TAKE 1 CAPSULE(40 MG) BY MOUTH TWICE DAILY BEFORE A MEAL       Environmental history: Patient lives in an apartment with central heat, carpet and manufactured wood floors without mold or mildew. He has cats inside and dogs outside, no roaches, smoking or fumes located in the home. He is a non-smoker.   Past Surgical History: Past Surgical History:  Procedure Laterality Date  . APPENDECTOMY  1956  . KNEE SURGERY  2009   right  . SHOULDER SURGERY  2010   left     Family History: Family History  Problem Relation Age of Onset  . Cerebral aneurysm Father   . Hypertension Father   . Diabetes Mother   . Hypertension Brother   . Diabetes Paternal Grandmother   . Colon cancer Neg Hx      Social History: Justin Gomez lives at home with family.   Review of Systems: a 14-point review of systems is pertinent for what is mentioned in HPI.  Otherwise, all other systems were negative. Constitutional: negative other than that listed in the HPI Eyes: negative other than that listed in the HPI Ears, nose, mouth, throat, and face: negative other than that listed in the HPI Respiratory: negative other than that listed in the HPI Cardiovascular: negative other than that listed in the HPI Gastrointestinal: negative other than that listed in the HPI Genitourinary: negative other than that listed in the HPI Integument: negative other than that listed in the HPI Hematologic: negative other than that listed in the HPI Musculoskeletal: negative other than that listed in the HPI Neurological: negative other than that listed in the HPI Allergy/Immunologic: negative other than that listed in the HPI    Objective:   There were no vitals taken for this visit. There is no height or weight on file to  calculate BMI.   Physical Exam:  General: Alert, interactive, in no acute distress. Eyes: No conjunctival injection present on the right and No conjunctival injection present on the left Ears: Right TM pearly gray with normal light reflex and Left TM pearly gray with normal light reflex.  Nose/Throat: External nose within normal limits and septum midline, turbinates minimally edematous with clear discharge, post-pharynx moderately erythematous with cobblestoning in the posterior oropharynx. Tonsils 2+ without exudates. Uvula elongated and touching throat. Neck: Supple without thyromegaly.  Adenopathy: shoddy bilateral anterior cervical lymphadenopathy. and no enlarged lymph nodes appreciated in the occipital, axillary, epitrochlear, inguinal, or popliteal regions. Lungs: Clear to auscultation without wheezing, rhonchi or rales. No increased work of breathing. CV: Normal S1/S2, no murmurs. Capillary refill <2 seconds.  Abdomen: Nondistended, nontender. No guarding or rebound tenderness. Bowel sounds present in all fields  Skin: Warm and dry, without lesions or rashes. Extremities:  No clubbing, cyanosis or edema. Neuro:   Grossly intact. No focal  deficits appreciated. Responsive to questions.   Spirometry: results normal (FEV1: 2.88%, FVC: 3.63/80%, FEV1/FVC: .79/107%).    Spirometry consistent with normal pattern. Albuterol nebulizer treatment given in clinic with no improvement. FVC: 3.64/80%, FEV1: 2.71/82%, FEV1R 0.74/100  Allergy Studies:   Indoor/Outdoor Percutaneous Adult Environmental Panel: negative with adequate controls.  Indoor/Outdoor Selected Intradermal Environmental Panel: positive to mold mix #1. Otherwise negative with adequate controls.  Thank you for the opportunity to care for this patient.  Please do not hesitate to contact me with questions.   Gareth Morgan, FNP Allergy and [place] [facility]  I have provided oversight concerning Gareth Morgan' evaluation  and treatment of this patient's health issues addressed during today's encounter. I agree with the assessment and therapeutic plan as outlined in the note.   Signed,   Jiles Prows, MD,  Allergy and Immunology,  [facility].

## 2017-03-16 DIAGNOSIS — N401 Enlarged prostate with lower urinary tract symptoms: Secondary | ICD-10-CM | POA: Diagnosis not present

## 2017-03-16 DIAGNOSIS — R3915 Urgency of urination: Secondary | ICD-10-CM | POA: Diagnosis not present

## 2017-03-16 DIAGNOSIS — R972 Elevated prostate specific antigen [PSA]: Secondary | ICD-10-CM | POA: Diagnosis not present

## 2017-03-27 ENCOUNTER — Ambulatory Visit: Payer: PPO | Admitting: Allergy and Immunology

## 2017-03-28 DIAGNOSIS — E039 Hypothyroidism, unspecified: Secondary | ICD-10-CM | POA: Diagnosis not present

## 2017-03-28 DIAGNOSIS — Z125 Encounter for screening for malignant neoplasm of prostate: Secondary | ICD-10-CM | POA: Diagnosis not present

## 2017-03-28 DIAGNOSIS — E1165 Type 2 diabetes mellitus with hyperglycemia: Secondary | ICD-10-CM | POA: Diagnosis not present

## 2017-03-28 DIAGNOSIS — Z0000 Encounter for general adult medical examination without abnormal findings: Secondary | ICD-10-CM | POA: Diagnosis not present

## 2017-03-28 DIAGNOSIS — I10 Essential (primary) hypertension: Secondary | ICD-10-CM | POA: Diagnosis not present

## 2017-03-28 DIAGNOSIS — Z23 Encounter for immunization: Secondary | ICD-10-CM | POA: Diagnosis not present

## 2017-04-04 DIAGNOSIS — E1142 Type 2 diabetes mellitus with diabetic polyneuropathy: Secondary | ICD-10-CM | POA: Diagnosis not present

## 2017-04-04 DIAGNOSIS — Z0000 Encounter for general adult medical examination without abnormal findings: Secondary | ICD-10-CM | POA: Diagnosis not present

## 2017-04-04 DIAGNOSIS — E039 Hypothyroidism, unspecified: Secondary | ICD-10-CM | POA: Diagnosis not present

## 2017-04-04 DIAGNOSIS — H6123 Impacted cerumen, bilateral: Secondary | ICD-10-CM | POA: Diagnosis not present

## 2017-04-17 DIAGNOSIS — L718 Other rosacea: Secondary | ICD-10-CM | POA: Diagnosis not present

## 2017-04-17 DIAGNOSIS — L570 Actinic keratosis: Secondary | ICD-10-CM | POA: Diagnosis not present

## 2017-04-17 DIAGNOSIS — L821 Other seborrheic keratosis: Secondary | ICD-10-CM | POA: Diagnosis not present

## 2017-04-17 DIAGNOSIS — D225 Melanocytic nevi of trunk: Secondary | ICD-10-CM | POA: Diagnosis not present

## 2017-07-14 ENCOUNTER — Encounter (HOSPITAL_COMMUNITY): Payer: Self-pay

## 2017-07-14 ENCOUNTER — Emergency Department (HOSPITAL_COMMUNITY): Payer: PPO

## 2017-07-14 ENCOUNTER — Observation Stay (HOSPITAL_COMMUNITY): Payer: PPO

## 2017-07-14 ENCOUNTER — Other Ambulatory Visit: Payer: Self-pay

## 2017-07-14 ENCOUNTER — Inpatient Hospital Stay (HOSPITAL_COMMUNITY)
Admission: EM | Admit: 2017-07-14 | Discharge: 2017-07-16 | DRG: 082 | Disposition: A | Discharge status: Home / Self Care | Payer: PPO | Attending: Internal Medicine | Admitting: Internal Medicine

## 2017-07-14 DIAGNOSIS — R29700 NIHSS score 0: Secondary | ICD-10-CM | POA: Diagnosis present

## 2017-07-14 DIAGNOSIS — R42 Dizziness and giddiness: Secondary | ICD-10-CM

## 2017-07-14 DIAGNOSIS — E119 Type 2 diabetes mellitus without complications: Secondary | ICD-10-CM | POA: Diagnosis not present

## 2017-07-14 DIAGNOSIS — E785 Hyperlipidemia, unspecified: Secondary | ICD-10-CM | POA: Diagnosis present

## 2017-07-14 DIAGNOSIS — I639 Cerebral infarction, unspecified: Secondary | ICD-10-CM | POA: Diagnosis not present

## 2017-07-14 DIAGNOSIS — Z7984 Long term (current) use of oral hypoglycemic drugs: Secondary | ICD-10-CM

## 2017-07-14 DIAGNOSIS — R29810 Facial weakness: Secondary | ICD-10-CM | POA: Diagnosis present

## 2017-07-14 DIAGNOSIS — Z8249 Family history of ischemic heart disease and other diseases of the circulatory system: Secondary | ICD-10-CM

## 2017-07-14 DIAGNOSIS — R350 Frequency of micturition: Secondary | ICD-10-CM | POA: Diagnosis present

## 2017-07-14 DIAGNOSIS — I951 Orthostatic hypotension: Secondary | ICD-10-CM | POA: Diagnosis present

## 2017-07-14 DIAGNOSIS — R1310 Dysphagia, unspecified: Secondary | ICD-10-CM | POA: Diagnosis present

## 2017-07-14 DIAGNOSIS — R262 Difficulty in walking, not elsewhere classified: Secondary | ICD-10-CM | POA: Diagnosis not present

## 2017-07-14 DIAGNOSIS — W19XXXA Unspecified fall, initial encounter: Secondary | ICD-10-CM | POA: Diagnosis not present

## 2017-07-14 DIAGNOSIS — I110 Hypertensive heart disease with heart failure: Secondary | ICD-10-CM | POA: Diagnosis present

## 2017-07-14 DIAGNOSIS — S064X0A Epidural hemorrhage without loss of consciousness, initial encounter: Secondary | ICD-10-CM | POA: Diagnosis not present

## 2017-07-14 DIAGNOSIS — R402252 Coma scale, best verbal response, oriented, at arrival to emergency department: Secondary | ICD-10-CM | POA: Diagnosis present

## 2017-07-14 DIAGNOSIS — I6200 Nontraumatic subdural hemorrhage, unspecified: Secondary | ICD-10-CM | POA: Diagnosis not present

## 2017-07-14 DIAGNOSIS — I5032 Chronic diastolic (congestive) heart failure: Secondary | ICD-10-CM | POA: Diagnosis present

## 2017-07-14 DIAGNOSIS — G239 Degenerative disease of basal ganglia, unspecified: Secondary | ICD-10-CM

## 2017-07-14 DIAGNOSIS — W1830XA Fall on same level, unspecified, initial encounter: Secondary | ICD-10-CM | POA: Diagnosis present

## 2017-07-14 DIAGNOSIS — Y92002 Bathroom of unspecified non-institutional (private) residence single-family (private) house as the place of occurrence of the external cause: Secondary | ICD-10-CM

## 2017-07-14 DIAGNOSIS — K219 Gastro-esophageal reflux disease without esophagitis: Secondary | ICD-10-CM | POA: Diagnosis not present

## 2017-07-14 DIAGNOSIS — I6330 Cerebral infarction due to thrombosis of unspecified cerebral artery: Secondary | ICD-10-CM | POA: Diagnosis not present

## 2017-07-14 DIAGNOSIS — R251 Tremor, unspecified: Secondary | ICD-10-CM | POA: Diagnosis present

## 2017-07-14 DIAGNOSIS — M25512 Pain in left shoulder: Secondary | ICD-10-CM | POA: Diagnosis not present

## 2017-07-14 DIAGNOSIS — E039 Hypothyroidism, unspecified: Secondary | ICD-10-CM | POA: Diagnosis present

## 2017-07-14 DIAGNOSIS — N401 Enlarged prostate with lower urinary tract symptoms: Secondary | ICD-10-CM | POA: Diagnosis present

## 2017-07-14 DIAGNOSIS — S4991XA Unspecified injury of right shoulder and upper arm, initial encounter: Secondary | ICD-10-CM | POA: Diagnosis not present

## 2017-07-14 DIAGNOSIS — I5030 Unspecified diastolic (congestive) heart failure: Secondary | ICD-10-CM | POA: Diagnosis not present

## 2017-07-14 DIAGNOSIS — Z823 Family history of stroke: Secondary | ICD-10-CM

## 2017-07-14 DIAGNOSIS — R270 Ataxia, unspecified: Secondary | ICD-10-CM | POA: Diagnosis not present

## 2017-07-14 DIAGNOSIS — R55 Syncope and collapse: Secondary | ICD-10-CM

## 2017-07-14 DIAGNOSIS — R2681 Unsteadiness on feet: Secondary | ICD-10-CM | POA: Diagnosis present

## 2017-07-14 DIAGNOSIS — S065X9A Traumatic subdural hemorrhage with loss of consciousness of unspecified duration, initial encounter: Secondary | ICD-10-CM | POA: Diagnosis present

## 2017-07-14 DIAGNOSIS — R110 Nausea: Secondary | ICD-10-CM | POA: Diagnosis not present

## 2017-07-14 DIAGNOSIS — S065XAA Traumatic subdural hemorrhage with loss of consciousness status unknown, initial encounter: Secondary | ICD-10-CM | POA: Diagnosis present

## 2017-07-14 DIAGNOSIS — R402142 Coma scale, eyes open, spontaneous, at arrival to emergency department: Secondary | ICD-10-CM | POA: Diagnosis present

## 2017-07-14 DIAGNOSIS — M21371 Foot drop, right foot: Secondary | ICD-10-CM | POA: Diagnosis present

## 2017-07-14 DIAGNOSIS — I6340 Cerebral infarction due to embolism of unspecified cerebral artery: Secondary | ICD-10-CM | POA: Diagnosis present

## 2017-07-14 DIAGNOSIS — S199XXA Unspecified injury of neck, initial encounter: Secondary | ICD-10-CM | POA: Diagnosis not present

## 2017-07-14 DIAGNOSIS — G903 Multi-system degeneration of the autonomic nervous system: Secondary | ICD-10-CM

## 2017-07-14 DIAGNOSIS — S0990XA Unspecified injury of head, initial encounter: Secondary | ICD-10-CM | POA: Diagnosis not present

## 2017-07-14 DIAGNOSIS — M25511 Pain in right shoulder: Secondary | ICD-10-CM | POA: Diagnosis not present

## 2017-07-14 DIAGNOSIS — R402362 Coma scale, best motor response, obeys commands, at arrival to emergency department: Secondary | ICD-10-CM | POA: Diagnosis present

## 2017-07-14 DIAGNOSIS — G238 Other specified degenerative diseases of basal ganglia: Secondary | ICD-10-CM

## 2017-07-14 DIAGNOSIS — Z79890 Hormone replacement therapy (postmenopausal): Secondary | ICD-10-CM

## 2017-07-14 DIAGNOSIS — I6389 Other cerebral infarction: Secondary | ICD-10-CM | POA: Diagnosis not present

## 2017-07-14 DIAGNOSIS — Z7982 Long term (current) use of aspirin: Secondary | ICD-10-CM

## 2017-07-14 DIAGNOSIS — I10 Essential (primary) hypertension: Secondary | ICD-10-CM | POA: Diagnosis present

## 2017-07-14 HISTORY — DX: Dizziness and giddiness: R42

## 2017-07-14 LAB — CBC WITH DIFFERENTIAL/PLATELET
Basophils Absolute: 0 10*3/uL (ref 0.0–0.1)
Basophils Relative: 0 %
Eosinophils Absolute: 0.1 10*3/uL (ref 0.0–0.7)
Eosinophils Relative: 1 %
HCT: 42.6 % (ref 39.0–52.0)
Hemoglobin: 14.7 g/dL (ref 13.0–17.0)
Lymphocytes Relative: 7 %
Lymphs Abs: 0.8 10*3/uL (ref 0.7–4.0)
MCH: 30.1 pg (ref 26.0–34.0)
MCHC: 34.5 g/dL (ref 30.0–36.0)
MCV: 87.3 fL (ref 78.0–100.0)
Monocytes Absolute: 0.5 10*3/uL (ref 0.1–1.0)
Monocytes Relative: 5 %
Neutro Abs: 9.2 10*3/uL — ABNORMAL HIGH (ref 1.7–7.7)
Neutrophils Relative %: 87 %
Platelets: 126 10*3/uL — ABNORMAL LOW (ref 150–400)
RBC: 4.88 MIL/uL (ref 4.22–5.81)
RDW: 13.1 % (ref 11.5–15.5)
WBC: 10.5 10*3/uL (ref 4.0–10.5)

## 2017-07-14 LAB — COMPREHENSIVE METABOLIC PANEL
ALT: 18 U/L (ref 17–63)
AST: 17 U/L (ref 15–41)
Albumin: 3.7 g/dL (ref 3.5–5.0)
Alkaline Phosphatase: 65 U/L (ref 38–126)
Anion gap: 10 (ref 5–15)
BUN: 16 mg/dL (ref 6–20)
CO2: 26 mmol/L (ref 22–32)
Calcium: 8.9 mg/dL (ref 8.9–10.3)
Chloride: 103 mmol/L (ref 101–111)
Creatinine, Ser: 0.73 mg/dL (ref 0.61–1.24)
GFR calc Af Amer: >60 mL/min (ref >60)
GFR calc non Af Amer: >60 mL/min (ref >60)
Glucose, Bld: 159 mg/dL — ABNORMAL HIGH (ref 65–99)
Potassium: 3.9 mmol/L (ref 3.5–5.1)
Sodium: 139 mmol/L (ref 135–145)
Total Bilirubin: 0.7 mg/dL (ref 0.3–1.2)
Total Protein: 6.1 g/dL — ABNORMAL LOW (ref 6.5–8.1)

## 2017-07-14 LAB — CBC
HCT: 38.5 % — ABNORMAL LOW (ref 39.0–52.0)
Hemoglobin: 13.1 g/dL (ref 13.0–17.0)
MCH: 29.8 pg (ref 26.0–34.0)
MCHC: 34 g/dL (ref 30.0–36.0)
MCV: 87.5 fL (ref 78.0–100.0)
Platelets: 124 10*3/uL — ABNORMAL LOW (ref 150–400)
RBC: 4.4 MIL/uL (ref 4.22–5.81)
RDW: 12.9 % (ref 11.5–15.5)
WBC: 7.8 10*3/uL (ref 4.0–10.5)

## 2017-07-14 LAB — HEMOGLOBIN A1C
Hgb A1c MFr Bld: 6 % — ABNORMAL HIGH (ref 4.8–5.6)
Mean Plasma Glucose: 125.5 mg/dL

## 2017-07-14 LAB — URINALYSIS, ROUTINE W REFLEX MICROSCOPIC
Bilirubin Urine: NEGATIVE
Glucose, UA: NEGATIVE mg/dL
Hgb urine dipstick: NEGATIVE
Ketones, ur: NEGATIVE mg/dL
Leukocytes, UA: NEGATIVE
Nitrite: NEGATIVE
Protein, ur: NEGATIVE mg/dL
Specific Gravity, Urine: 1.014 (ref 1.005–1.030)
pH: 7 (ref 5.0–8.0)

## 2017-07-14 LAB — CREATININE, SERUM
Creatinine, Ser: 0.79 mg/dL (ref 0.61–1.24)
GFR calc Af Amer: >60 mL/min (ref >60)
GFR calc non Af Amer: >60 mL/min (ref >60)

## 2017-07-14 LAB — TSH: TSH: 2.491 u[IU]/mL (ref 0.350–4.500)

## 2017-07-14 LAB — GLUCOSE, CAPILLARY
Glucose-Capillary: 166 mg/dL — ABNORMAL HIGH (ref 65–99)
Glucose-Capillary: 133 mg/dL — ABNORMAL HIGH (ref 65–99)

## 2017-07-14 LAB — CBG MONITORING, ED: Glucose-Capillary: 157 mg/dL — ABNORMAL HIGH (ref 65–99)

## 2017-07-14 MED ORDER — SODIUM CHLORIDE 0.9 % IV BOLUS (SEPSIS)
1000.0000 mL | Freq: Once | INTRAVENOUS | Status: AC
  Administered 2017-07-14: 1000 mL via INTRAVENOUS

## 2017-07-14 MED ORDER — ONDANSETRON HCL 4 MG/2ML IJ SOLN: 4.0000 mg | Freq: Four times a day (QID) | INTRAMUSCULAR | Status: DC | PRN

## 2017-07-14 MED ORDER — FINASTERIDE 5 MG PO TABS
5.0000 mg | ORAL_TABLET | Freq: Every day | ORAL | Status: DC
  Administered 2017-07-14 – 2017-07-16 (×3): 5 mg via ORAL
  Filled 2017-07-14 (×3): qty 1

## 2017-07-14 MED ORDER — ASPIRIN 81 MG PO CHEW
81.0000 mg | CHEWABLE_TABLET | Freq: Every day | ORAL | Status: DC
  Administered 2017-07-14 – 2017-07-15 (×2): 81 mg via ORAL
  Filled 2017-07-14 (×2): qty 1

## 2017-07-14 MED ORDER — ACETAMINOPHEN 325 MG PO TABS: 650.0000 mg | ORAL_TABLET | Freq: Four times a day (QID) | ORAL | Status: DC | PRN

## 2017-07-14 MED ORDER — SODIUM CHLORIDE 0.9% FLUSH
3.0000 mL | Freq: Two times a day (BID) | INTRAVENOUS | Status: DC
  Administered 2017-07-15 – 2017-07-16 (×2): 3 mL via INTRAVENOUS

## 2017-07-14 MED ORDER — OXYCODONE HCL 5 MG PO TABS: 5.0000 mg | ORAL_TABLET | ORAL | Status: DC | PRN

## 2017-07-14 MED ORDER — TAMSULOSIN HCL 0.4 MG PO CAPS
0.4000 mg | ORAL_CAPSULE | Freq: Every day | ORAL | Status: DC
  Administered 2017-07-14 – 2017-07-16 (×3): 0.4 mg via ORAL
  Filled 2017-07-14 (×3): qty 1

## 2017-07-14 MED ORDER — ACETAMINOPHEN 650 MG RE SUPP: 650.0000 mg | Freq: Four times a day (QID) | RECTAL | Status: DC | PRN

## 2017-07-14 MED ORDER — MIRABEGRON ER 25 MG PO TB24
25.0000 mg | ORAL_TABLET | Freq: Every day | ORAL | Status: DC
  Administered 2017-07-14 – 2017-07-16 (×3): 25 mg via ORAL
  Filled 2017-07-14 (×3): qty 1

## 2017-07-14 MED ORDER — LEVOTHYROXINE SODIUM 75 MCG PO TABS
75.0000 ug | ORAL_TABLET | Freq: Every day | ORAL | Status: DC
  Administered 2017-07-15 – 2017-07-16 (×2): 75 ug via ORAL
  Filled 2017-07-14 (×2): qty 1

## 2017-07-14 MED ORDER — ENOXAPARIN SODIUM 40 MG/0.4ML ~~LOC~~ SOLN
40.0000 mg | SUBCUTANEOUS | Status: DC
  Administered 2017-07-14: 40 mg via SUBCUTANEOUS
  Filled 2017-07-14: qty 0.4

## 2017-07-14 MED ORDER — ASPIRIN 81 MG PO TABS: 81.0000 mg | ORAL_TABLET | Freq: Every day | ORAL | Status: DC

## 2017-07-14 MED ORDER — PANTOPRAZOLE SODIUM 40 MG PO TBEC
40.0000 mg | DELAYED_RELEASE_TABLET | Freq: Every day | ORAL | Status: DC
Start: 2017-07-14 — End: 2017-07-16
  Administered 2017-07-14 – 2017-07-16 (×3): 40 mg via ORAL
  Filled 2017-07-14 (×3): qty 1

## 2017-07-14 MED ORDER — FAMOTIDINE 20 MG PO TABS
20.0000 mg | ORAL_TABLET | Freq: Two times a day (BID) | ORAL | Status: DC
  Administered 2017-07-14 – 2017-07-16 (×5): 20 mg via ORAL
  Filled 2017-07-14 (×5): qty 1

## 2017-07-14 MED ORDER — GLIPIZIDE 5 MG PO TABS
5.0000 mg | ORAL_TABLET | Freq: Every day | ORAL | Status: DC
  Administered 2017-07-14 – 2017-07-16 (×3): 5 mg via ORAL
  Filled 2017-07-14 (×3): qty 1

## 2017-07-14 MED ORDER — ONDANSETRON HCL 4 MG PO TABS: 4.0000 mg | ORAL_TABLET | Freq: Four times a day (QID) | ORAL | Status: DC | PRN

## 2017-07-14 MED ORDER — LOSARTAN POTASSIUM 50 MG PO TABS
25.0000 mg | ORAL_TABLET | Freq: Every day | ORAL | Status: DC
  Administered 2017-07-14 – 2017-07-15 (×2): 25 mg via ORAL
  Filled 2017-07-14 (×3): qty 1

## 2017-07-14 MED ORDER — POTASSIUM CHLORIDE IN NACL 20-0.9 MEQ/L-% IV SOLN
INTRAVENOUS | Status: AC
  Administered 2017-07-14 – 2017-07-15 (×2): via INTRAVENOUS
  Filled 2017-07-14 (×3): qty 1000

## 2017-07-14 MED ORDER — INSULIN ASPART 100 UNIT/ML ~~LOC~~ SOLN
0.0000 [IU] | Freq: Three times a day (TID) | SUBCUTANEOUS | Status: DC
  Administered 2017-07-14 – 2017-07-16 (×3): 3 [IU] via SUBCUTANEOUS

## 2017-07-14 NOTE — ED Notes (Signed)
Patient transported to X-ray 

## 2017-07-14 NOTE — ED Notes (Signed)
Urinal at bedside, pt states unable to void at this time.

## 2017-07-14 NOTE — ED Notes (Signed)
Lab at bedside

## 2017-07-14 NOTE — H&P (Addendum)
History and Physical    Justin Gomez:[id number] DOB: 1941-05-24 DOA: 07/14/2017  PCP: Jani Gravel, MD  Patient coming from: [facility]  I have personally briefly reviewed patient's old medical records in [facility]  Chief Complaint: Extremely unsteady gait gait and dizziness which began this morning  HPI: Justin Gomez is a 77 y.o. male with medical history significant of non-insulin-dependent diabetes mellitus, hypertension, hyperlipidemia, hypothyroidism, postural hypotension (for which he saw neurologist in October of this year and was prescribed Lomotil modalities including as needed meclizine) who presents from home with an unsteady gait and a fall associated with disequilibrium.  He states he got out of bed he felt very lightheaded and nauseous he got back into bed because the lightheadedness was so severe.  He then had to urinate.  He got up to use the restroom and urinated felt mildly nauseous and then next thing he knew he was down on the floor.  He did not lose consciousness.  He did hit the basal aspect of his head but there was no lacerations.  His wife heard the fall and really try to get him up but he wanted to stay on the ground because he felt so dizzy and lightheaded.  Had some intermittent issues with his balance that has been going on for some time.  Balance issues have not been steadily progressive but intermittent.  Is not anticoagulated does take a low-dose aspirin.  He denies any fevers chills cough he has been in his usual state of decent health.  Had no change in his bowel or bladder habits he does note urinary frequency at night which has been common for him.  He has a history of an enlarged prostate.  He takes Flomax regularly.  He has had a basilar headache over the last several weeks that has worsened with exertion.  He does have a history of vertigo and has had meclizine in the past he did not take his meclizine today because his symptoms are  quite different.  He says that the room is not spinning he just feels dizzy and lightheaded.  Evaluation in the ED was unremarkable for illogical findings or laboratory abnormalities.  When he was ambulated the patient was very dizzy he also felt very dizzy sitting on the edge of the bed and had to lie back down.  I was asked to further evaluate the patient.  Review of Systems: As per HPI otherwise 10 point review of systems negative.    Past Medical History:  Diagnosis Date  . BPH (benign prostatic hypertrophy)   . Diabetes mellitus   . Diverticulosis   . Gastroesophageal reflux disease 04/13/2016  . Hiatal hernia   . Hyperlipidemia   . Hypertension   . Hypothyroidism   . Lumbar herniated disc    L4    Past Surgical History:  Procedure Laterality Date  . APPENDECTOMY  1956  . KNEE SURGERY  2009   right  . SHOULDER SURGERY  2010   left     reports that  has never smoked. he has never used smokeless tobacco. He reports that he drinks about 1.8 oz of alcohol per week. He reports that he does not use drugs.  No Known Allergies  Family History  Problem Relation Age of Onset  . Cerebral aneurysm Father   . Hypertension Father   . Diabetes Mother   . Hypertension Brother   . Diabetes Paternal Grandmother   . Colon cancer  Neg Hx      Prior to Admission medications   Medication Sig Start Date End Date Taking? Authorizing Provider  aspirin 81 MG tablet Take 81 mg by mouth daily.    Yes [provider]  finasteride (PROSCAR) 5 MG tablet Take 5 mg by mouth daily.   Yes [provider]  glipiZIDE (GLUCOTROL) 5 MG tablet Take 5 mg by mouth daily.  11/27/15  Yes [provider]  levothyroxine (SYNTHROID, LEVOTHROID) 75 MCG tablet Take 75 mcg by mouth daily.   Yes [provider]  losartan (COZAAR) 25 MG tablet Take 25 mg by mouth daily.   Yes [provider]  metFORMIN (GLUCOPHAGE-XR) 750 MG 24 hr tablet Take 750 mg by mouth daily.   11/03/15  Yes [provider]  mirabegron ER (MYRBETRIQ) 25 MG TB24 tablet Take 25 mg by mouth daily.   Yes [provider]  ranitidine (ZANTAC) 300 MG tablet Take 1 tablet (300 mg total) by mouth at bedtime. 02/27/17  Yes Kozlow, Donnamarie Poag, MD  tamsulosin (FLOMAX) 0.4 MG CAPS capsule Take 0.4 mg by mouth daily.   Yes [provider]    Physical Exam: Vitals:   07/14/17 1230 07/14/17 1311 07/14/17 1315 07/14/17 1604  BP:   (!) 175/81 (!) 168/76  Pulse:   69 73  Resp:   16 16  Temp:  (!) 97.5 F (36.4 C)    TempSrc:  Oral    SpO2:   100% 100%  Weight: 81.6 kg (180 lb)     Height: 5\' 10"  (1.778 m)      .TCS Constitutional: NAD, calm, comfortable Vitals:   07/14/17 1230 07/14/17 1311 07/14/17 1315 07/14/17 1604  BP:   (!) 175/81 (!) 168/76  Pulse:   69 73  Resp:   16 16  Temp:  (!) 97.5 F (36.4 C)    TempSrc:  Oral    SpO2:   100% 100%  Weight: 81.6 kg (180 lb)     Height: 5\' 10"  (1.778 m)      Eyes: PERRL, lids and conjunctivae normal ENMT: Mucous membranes are moist. Posterior pharynx clear of any exudate or lesions.Normal dentition.  Neck: normal, supple, no masses, no thyromegaly Respiratory: clear to auscultation bilaterally, no wheezing, no crackles. Normal respiratory effort. No accessory muscle use.  Cardiovascular: Regular rate and rhythm, no murmurs / rubs / gallops. No extremity edema. 2+ pedal pulses. No carotid bruits.  Abdomen: no tenderness, no masses palpated. No hepatosplenomegaly. Bowel sounds positive.  Musculoskeletal: no clubbing / cyanosis. No joint deformity upper and lower extremities. Good ROM, no contractures. Normal muscle tone.  Skin: no rashes, lesions, ulcers. No induration Neurologic: CN 2-12 grossly intact. Sensation intact, DTR normal. Strength 5/5 in all 4.  Balance is now good sitting at the edge of the bed.  Significant bilateral cogwheel rigidity that extinguishes Psychiatric: Normal judgment and insight. Alert and  oriented x 3. Normal mood.     Labs on Admission: I have personally reviewed following labs and imaging studies  CBC: Recent Labs  Lab 07/14/17 1211 07/14/17 1535  WBC 10.5 7.8  NEUTROABS 9.2*  --   HGB 14.7 13.1  HCT 42.6 38.5*  MCV 87.3 87.5  PLT 126* 619*   Basic Metabolic Panel: Recent Labs  Lab 07/14/17 1211 07/14/17 1535  NA 139  --   K 3.9  --   CL 103  --   CO2 26  --   GLUCOSE 159*  --  BUN 16  --   CREATININE 0.73 0.79  CALCIUM 8.9  --    GFR: Estimated Creatinine Clearance: 81.1 mL/min (by C-G formula based on SCr of 0.79 mg/dL). Liver Function Tests: Recent Labs  Lab 07/14/17 1211  AST 17  ALT 18  ALKPHOS 65  BILITOT 0.7  PROT 6.1*  ALBUMIN 3.7   No results for input(s): LIPASE, AMYLASE in the last 168 hours. No results for input(s): AMMONIA in the last 168 hours. Coagulation Profile: No results for input(s): INR, PROTIME in the last 168 hours. Cardiac Enzymes: No results for input(s): CKTOTAL, CKMB, CKMBINDEX, TROPONINI in the last 168 hours. BNP (last 3 results) No results for input(s): PROBNP in the last 8760 hours. HbA1C: Recent Labs    07/14/17 1525  HGBA1C 6.0*   CBG: Recent Labs  Lab 07/14/17 1127  GLUCAP 157*   Lipid Profile: No results for input(s): CHOL, HDL, LDLCALC, TRIG, CHOLHDL, LDLDIRECT in the last 72 hours. Thyroid Function Tests: Recent Labs    07/14/17 1211  TSH 2.491   Anemia Panel: No results for input(s): VITAMINB12, FOLATE, FERRITIN, TIBC, IRON, RETICCTPCT in the last 72 hours. Urine analysis:    Component Value Date/Time   COLORURINE YELLOW 07/14/2017 [place] 07/14/2017 1137   LABSPEC 1.014 07/14/2017 1137   PHURINE 7.0 07/14/2017 1137   GLUCOSEU NEGATIVE 07/14/2017 1137   HGBUR NEGATIVE 07/14/2017 1137   BILIRUBINUR NEGATIVE 07/14/2017 1137   KETONESUR NEGATIVE 07/14/2017 1137   PROTEINUR NEGATIVE 07/14/2017 1137   NITRITE NEGATIVE 07/14/2017 1137   LEUKOCYTESUR NEGATIVE  07/14/2017 1137    Radiological Exams on Admission: Dg Shoulder Right  Result Date: 07/14/2017 CLINICAL DATA:  Recent fall with shoulder pain, initial encounter EXAM: RIGHT SHOULDER - 2+ VIEW COMPARISON:  None. FINDINGS: Degenerative changes of the acromioclavicular joint and glenohumeral articulation are noted. No acute fracture or dislocation is seen. The underlying bony thorax is within normal limits. IMPRESSION: Degenerative change without acute abnormality. Electronically Signed   By: Inez Catalina M.D.   On: 07/14/2017 13:10   Ct Head Wo Contrast  Result Date: 07/14/2017 CLINICAL DATA:  Fall this morning.  Maxillofacial trauma EXAM: CT HEAD WITHOUT CONTRAST CT CERVICAL SPINE WITHOUT CONTRAST TECHNIQUE: Multidetector CT imaging of the head and cervical spine was performed following the standard protocol without intravenous contrast. Multiplanar CT image reconstructions of the cervical spine were also generated. COMPARISON:  Brain MRI 09/15/2016 FINDINGS: CT HEAD FINDINGS Brain: No evidence of acute infarction, hemorrhage, hydrocephalus, extra-axial collection or mass lesion/mass effect. Vascular: Atherosclerotic calcification.  No hyperdense vessel. Skull: Negative for fracture. Reticulation of subcutaneous fat in the occipital scalp which could be contusion. Sinuses/Orbits: No evidence of injury. CT CERVICAL SPINE FINDINGS Alignment: Normal Skull base and vertebrae: Negative for fracture. There is bulky flowing osteophytes from C3 to T1 with ankylosis. Soft tissues and spinal canal: No prevertebral fluid or swelling. No visible canal hematoma. Bulky spurs encroach on the pharynx. Disc levels: C1-2, C2-3 and T1-2 is likely mobile. No visible cord impingement. Upper chest: Negative IMPRESSION: 1. No evidence of acute intracranial or cervical spine injury. 2. 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 The bulky spurs encroach on the pharynx. Electronically Signed   By: Monte Fantasia  M.D.   On: 07/14/2017 13:34   Ct Cervical Spine Wo Contrast  Result Date: 07/14/2017 CLINICAL DATA:  Fall this morning.  Maxillofacial trauma EXAM: CT HEAD WITHOUT CONTRAST CT CERVICAL SPINE WITHOUT CONTRAST TECHNIQUE: Multidetector CT imaging of the  head and cervical spine was performed following the standard protocol without intravenous contrast. Multiplanar CT image reconstructions of the cervical spine were also generated. COMPARISON:  Brain MRI 09/15/2016 FINDINGS: CT HEAD FINDINGS Brain: No evidence of acute infarction, hemorrhage, hydrocephalus, extra-axial collection or mass lesion/mass effect. Vascular: Atherosclerotic calcification.  No hyperdense vessel. Skull: Negative for fracture. Reticulation of subcutaneous fat in the occipital scalp which could be contusion. Sinuses/Orbits: No evidence of injury. CT CERVICAL SPINE FINDINGS Alignment: Normal Skull base and vertebrae: Negative for fracture. There is bulky flowing osteophytes from C3 to T1 with ankylosis. Soft tissues and spinal canal: No prevertebral fluid or swelling. No visible canal hematoma. Bulky spurs encroach on the pharynx. Disc levels: C1-2, C2-3 and T1-2 is likely mobile. No visible cord impingement. Upper chest: Negative IMPRESSION: 1. No evidence of acute intracranial or cervical spine injury. 2. 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 The bulky spurs encroach on the pharynx. Electronically Signed   By: Monte Fantasia M.D.   On: 07/14/2017 13:34    EKG: Independently reviewed.  Normal sinus rhythm normal EKG  Assessment/Plan Principal Problem:   Postural dizziness with near syncope Active Problems:   Essential hypertension   Type 2 diabetes mellitus without complication, without long-term current use of insulin (HCC)   Postural hypotension   Gastroesophageal reflux disease  1.  Postural dizziness with near syncope: Given that during the patient's stay in the ER his symptoms improved to the point  where he is able to sit at the edge of the bed I am concerned that this may have been a CNS event.  Orthostatics are pending in the emergency department at the time of admission.  He has been ordered a liter of IV fluid.  Will admit the patient into the hospital and obtain an MRI.  I have spoken with Dr. Malen Gauze from neurology who does not feel the patient requires a neurology consultation however if the MRI is abnormal then consultation would be in order.  I also think reevaluation as an outpatient with neurology would be important given the cogwheel rigidity and questions about whether or not the patient has Parkinson's.  2.  Essential hypertension: The patient did not have his blood pressure medication this morning and it is quite elevated here in the emergency department will order his medications and give them to him tonight restart on his regular regimen tomorrow.  3.  Type 2 diabetes mellitus without complication without long-term continuous use of insulin: Continue home medication regimen order sliding scale insulin coverage.  4.  Postural hypotension: Patient diagnosed with this in the past was told to check his blood pressures regularly drink lots of fluids and stay hydrated get up slowly and bring his blood pressure log to his doctor.  He was also instructed to continue meclizine if needed.  Given today's finding of cogwheel rigidity on physical examination patient will clearly need an outpatient reevaluation.  He may have Parkinson's disease.  5.  Gastroesophageal reflux disease: Continue home medication management.  DVT prophylaxis: Lovenox Code Status: Full Family Communication: She is wife and daughter at the bedside Disposition Plan: Likely home Consults called: Spoke with Dr. Malen Gauze no official consult placed. Admission status: Observation   Lady Deutscher MD FACP [facility] Pager [phone]  If 7PM-7AM, please contact night-coverage [email or web address] Password  [email or web address]  07/14/2017, 5:22 PM

## 2017-07-14 NOTE — ED Notes (Signed)
MRI stated pt was unable to complete imaging, stated each time they put pt in scanner he had to void. Stated they would attempt at a later time.

## 2017-07-14 NOTE — ED Triage Notes (Signed)
Pt presents via [facility] for evaluation of fall today with extremely unsteady gait. Pt had mechanical fall in bathroom. Denies LOC. Reports some mild dizziness prior to fall. Pt has softball size hematoma to posterior head, no blood thinners. AxO x4. Passed stroke eval for EMS.

## 2017-07-14 NOTE — ED Notes (Signed)
Attempted to call report x 1  

## 2017-07-14 NOTE — Progress Notes (Signed)
Justin Gomez is a 77 y.o. male patient admitted from ED awake, alert - oriented  X 4 - no acute distress noted.  VSS - Blood pressure (!) 164/77, pulse 81, temperature 97.7 F (36.5 C), temperature source Oral, resp. rate 18, height 5\' 10"  (1.778 m), weight 83.1 kg (183 lb 4.8 oz), SpO2 100 %.    IV in place, occlusive dsg intact without redness.  Orientation to room, and floor completed with information packet given to patient/family.  Patient declined safety video at this time.  Admission INP armband ID verified with patient/family, and in place.   SR up x 2, fall assessment complete, with patient and family able to verbalize understanding of risk associated with falls, and verbalized understanding to call nsg before up out of bed.  Call light within reach, patient able to voice, and demonstrate understanding.  Skin, clean-dry- intact without evidence of bruising, or skin tears.   No evidence of skin break down noted on exam.     Will cont to eval and treat per MD orders.  Holley Raring, RN 07/14/2017 6:55 PM

## 2017-07-14 NOTE — ED Notes (Signed)
Pt given water and a sandwich per EDP

## 2017-07-14 NOTE — ED Provider Notes (Signed)
[facility] Provider Note   CSN: [id number] Arrival date & time: 07/14/17  1108     History   Chief Complaint Chief Complaint  Patient presents with  . Fall    HPI   Blood pressure (!) 167/79, pulse 70, resp. rate 20, SpO2 100 %.  Justin Gomez is a 77 y.o. male with past medical history significant for non-insulin-dependent diabetes, hypertension, hyperlipidemia and hypothyroid complaining of disequilibrium and fall this morning.  He states that when he went to get up out of bed he felt very lightheaded and nauseous, he got back into bed because the lightheadedness was so severe, he then had to urinate, he got up to use the restroom, he urinated and felt mildly nauseous, he then was down on the floor.  He did not lose consciousness, he did hit the basilar aspect of his head there was no lacerations.  His wife heard the fall and came immediately, she tried to get him up but he wanted to stay on the ground because he felt so lightheaded.  He states he has some intermittent issues with his balance, this is been going on for some time, it is not steadily progressive but just intermittent.  He is not anticoagulated B does take a low-dose aspirin.  He reports left-sided shoulder pain with no cervicalgia, chest pain, abdominal pain.  He has been in his normal state of health with no fevers, chills, cough.  He denies any change in bowel or bladder habits but notes urinary frequency overnight.  He is being followed for enlarged prostate.  He takes Flomax regularly.  Patient does note a mild constipation recently which resolved, he also states that he has a basilar headache over the last several weeks that is worsened with exertion.  Has history of vertigo and has had meclizine in the past, he said he did a did not take that over the last several days because this feeling is very dissimilar is more lightheadedness.  Past Medical History:  Diagnosis Date  . BPH  (benign prostatic hypertrophy)   . Diabetes mellitus   . Diverticulosis   . Gastroesophageal reflux disease 04/13/2016  . Hiatal hernia   . Hyperlipidemia   . Hypertension   . Hypothyroidism   . Lumbar herniated disc    L4    Patient Active Problem List   Diagnosis Date Noted  . Postural hypotension 01/22/2017  . Gastroesophageal reflux disease 04/13/2016  . Cough 04/13/2016  . 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 Essential hypertension 11/29/2015  . Type 2 diabetes mellitus without complication, without long-term current use of insulin ([place]) 11/29/2015    Past Surgical History:  Procedure Laterality Date  . APPENDECTOMY  1956  . KNEE SURGERY  2009   right  . SHOULDER SURGERY  2010   left       Home Medications    Prior to Admission medications   Medication Sig Start Date End Date Taking? Authorizing Provider  aspirin 81 MG tablet Take 81 mg by mouth daily.    Yes [provider]  finasteride (PROSCAR) 5 MG tablet Take 5 mg by mouth daily.   Yes [provider]  glipiZIDE (GLUCOTROL) 5 MG tablet Take 5 mg by mouth daily.  11/27/15  Yes [provider]  levothyroxine (SYNTHROID, LEVOTHROID) 75 MCG tablet Take 75 mcg by mouth daily.   Yes [provider]  losartan (COZAAR) 25 MG tablet Take 25  mg by mouth daily.   Yes [provider]  metFORMIN (GLUCOPHAGE-XR) 750 MG 24 hr tablet Take 750 mg by mouth daily.  11/03/15  Yes [provider]  mirabegron ER (MYRBETRIQ) 25 MG TB24 tablet Take 25 mg by mouth daily.   Yes [provider]  tamsulosin (FLOMAX) 0.4 MG CAPS capsule Take 0.4 mg by mouth daily.   Yes [provider]  omeprazole (PRILOSEC) 40 MG capsule TAKE 1 CAPSULE(40 MG) BY MOUTH TWICE DAILY BEFORE A MEAL 10/17/16   Zehr, Janett Billow D, PA-C  pantoprazole (PROTONIX) 40 MG tablet Take 1 tablet (40 mg total) by mouth daily. 02/27/17   Kozlow, Donnamarie Poag, MD  ranitidine  (ZANTAC) 300 MG tablet Take 1 tablet (300 mg total) by mouth at bedtime. 02/27/17   Kozlow, Donnamarie Poag, MD    Family History Family History  Problem Relation Age of Onset  . Cerebral aneurysm Father   . Hypertension Father   . Diabetes Mother   . Hypertension Brother   . Diabetes Paternal Grandmother   . Colon cancer Neg Hx     Social History Social History   Tobacco Use  . Smoking status: Never Smoker  . Smokeless tobacco: Never Used  Substance Use Topics  . Alcohol use: Yes    Alcohol/week: 1.8 oz    Types: 2 Standard drinks or equivalent, 1 Glasses of wine per week    Comment: Rarely  . Drug use: No     Allergies   Patient has no known allergies.   Review of Systems Review of Systems   A complete review of systems was obtained and all systems are negative except as noted in the HPI and PMH.   Physical Exam Updated Vital Signs BP (!) 175/81   Pulse 69   Temp (!) 97.5 F (36.4 C) (Oral)   Resp 16   Ht 5\' 10"  (1.778 m)   Wt 81.6 kg (180 lb)   SpO2 100%   BMI 25.83 kg/m   Physical Exam  Constitutional: He is oriented to person, place, and time. He appears well-developed and well-nourished. No distress.  HENT:  Head: Normocephalic.  Mouth/Throat: Oropharynx is clear and moist.  Small hematoma to posterior occipital region  Eyes: Conjunctivae and EOM are normal. Pupils are equal, round, and reactive to light.  Neck: Normal range of motion.  No midline C-spine  tenderness to palpation or step-offs appreciated. Patient has full range of motion without pain.  Grip strength, biceps, triceps 5/5 bilaterally;  can differentiate between pinprick and light touch bilaterally.   Cardiovascular: Normal rate, regular rhythm and intact distal pulses.  Pulmonary/Chest: Effort normal and breath sounds normal. No stridor. No respiratory distress. He has no wheezes. He has no rales. He exhibits no tenderness.  Abdominal: Soft. He exhibits no distension. There is no tenderness.   Musculoskeletal: Normal range of motion.  Neurological: He is alert and oriented to person, place, and time.  II-Visual fields grossly intact. III/IV/VI-Extraocular movements intact.  Pupils reactive bilaterally. V/VII-Smile symmetric, equal eyebrow raise,  facial sensation intact VIII- Hearing grossly intact IX/X-Normal gag XI-bilateral shoulder shrug XII-midline tongue extension Motor: 5/5 bilaterally with normal tone and bulk Cerebellar: Normal finger-to-nose  and normal heel-to-shin test.   Romberg negative Unsteady gait, shortened steps  Skin: He is not diaphoretic.  Psychiatric: He has a normal mood and affect.  Nursing note and vitals reviewed.   ED Treatments / Results  Labs (all labs ordered are listed, but only abnormal results are  displayed) Labs Reviewed  CBC WITH DIFFERENTIAL/PLATELET - Abnormal; Notable for the following components:      Result Value   Platelets 126 (*)    Neutro Abs 9.2 (*)    All other components within normal limits  COMPREHENSIVE METABOLIC PANEL - Abnormal; Notable for the following components:   Glucose, Bld 159 (*)    Total Protein 6.1 (*)    All other components within normal limits  CBG MONITORING, ED - Abnormal; Notable for the following components:   Glucose-Capillary 157 (*)    All other components within normal limits  URINALYSIS, ROUTINE W REFLEX MICROSCOPIC  TSH    EKG  EKG Interpretation  Date/Time:  Saturday July 14 2017 11:21:55 EST Ventricular Rate:  68 PR Interval:    QRS Duration: 103 QT Interval:  396 QTC Calculation: 422 R Axis:   1 Text Interpretation:  Sinus rhythm Confirmed by Davonna Belling [phone]) on 07/14/2017 12:03:28 PM       Radiology Dg Shoulder Right  Result Date: 07/14/2017 CLINICAL DATA:  Recent fall with shoulder pain, initial encounter EXAM: RIGHT SHOULDER - 2+ VIEW COMPARISON:  None. FINDINGS: Degenerative changes of the acromioclavicular joint and glenohumeral articulation are noted.  No acute fracture or dislocation is seen. The underlying bony thorax is within normal limits. IMPRESSION: Degenerative change without acute abnormality. Electronically Signed   By: Inez Catalina M.D.   On: 07/14/2017 13:10   Ct Head Wo Contrast  Result Date: 07/14/2017 CLINICAL DATA:  Fall this morning.  Maxillofacial trauma EXAM: CT HEAD WITHOUT CONTRAST CT CERVICAL SPINE WITHOUT CONTRAST TECHNIQUE: Multidetector CT imaging of the head and cervical spine was performed following the standard protocol without intravenous contrast. Multiplanar CT image reconstructions of the cervical spine were also generated. COMPARISON:  Brain MRI 09/15/2016 FINDINGS: CT HEAD FINDINGS Brain: No evidence of acute infarction, hemorrhage, hydrocephalus, extra-axial collection or mass lesion/mass effect. Vascular: Atherosclerotic calcification.  No hyperdense vessel. Skull: Negative for fracture. Reticulation of subcutaneous fat in the occipital scalp which could be contusion. Sinuses/Orbits: No evidence of injury. CT CERVICAL SPINE FINDINGS Alignment: Normal Skull base and vertebrae: Negative for fracture. There is bulky flowing osteophytes from C3 to T1 with ankylosis. Soft tissues and spinal canal: No prevertebral fluid or swelling. No visible canal hematoma. Bulky spurs encroach on the pharynx. Disc levels: C1-2, C2-3 and T1-2 is likely mobile. No visible cord impingement. Upper chest: Negative IMPRESSION: 1. No evidence of acute intracranial or cervical spine injury. 2. 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 The bulky spurs encroach on the pharynx. Electronically Signed   By: Monte Fantasia M.D.   On: 07/14/2017 13:34   Ct Cervical Spine Wo Contrast  Result Date: 07/14/2017 CLINICAL DATA:  Fall this morning.  Maxillofacial trauma EXAM: CT HEAD WITHOUT CONTRAST CT CERVICAL SPINE WITHOUT CONTRAST TECHNIQUE: Multidetector CT imaging of the head and cervical spine was performed following the standard  protocol without intravenous contrast. Multiplanar CT image reconstructions of the cervical spine were also generated. COMPARISON:  Brain MRI 09/15/2016 FINDINGS: CT HEAD FINDINGS Brain: No evidence of acute infarction, hemorrhage, hydrocephalus, extra-axial collection or mass lesion/mass effect. Vascular: Atherosclerotic calcification.  No hyperdense vessel. Skull: Negative for fracture. Reticulation of subcutaneous fat in the occipital scalp which could be contusion. Sinuses/Orbits: No evidence of injury. CT CERVICAL SPINE FINDINGS Alignment: Normal Skull base and vertebrae: Negative for fracture. There is bulky flowing osteophytes from C3 to T1 with ankylosis. Soft tissues and spinal canal: No prevertebral fluid or swelling. No  visible canal hematoma. Bulky spurs encroach on the pharynx. Disc levels: C1-2, C2-3 and T1-2 is likely mobile. No visible cord impingement. Upper chest: Negative IMPRESSION: 1. No evidence of acute intracranial or cervical spine injury. 2. 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 The bulky spurs encroach on the pharynx. Electronically Signed   By: Monte Fantasia M.D.   On: 07/14/2017 13:34    Procedures Procedures (including critical care time)  Medications Ordered in ED Medications  sodium chloride 0.9 % bolus 1,000 mL (not administered)     Initial Impression / Assessment and Plan / ED Course  I have reviewed the triage vital signs and the nursing notes.  Pertinent labs & imaging results that were available during my care of the patient were reviewed by me and considered in my medical decision making (see chart for details).     Vitals:   07/14/17 1145 07/14/17 1230 07/14/17 1311 07/14/17 1315  BP: (!) 154/76   (!) 175/81  Pulse: 67   69  Resp: 17   16  Temp:   (!) 97.5 F (36.4 C)   TempSrc:   Oral   SpO2: 100%   100%  Weight:  81.6 kg (180 lb)    Height:  5\' 10"  (1.778 m)       Justin Gomez is 77 y.o. male presenting with  unsteadiness and falls/syncope this a.m.  He did have head trauma with occipital contusion this a.m., no signs of the basilar skull fracture.  He is not anticoagulated but he does take a low-dose aspirin.  Nonfocal neurologic exam.  EKG with no arrhythmias, blood work reassuring, thyroid test normal.  I have tried to ambulate this patient and he is unable to walk more than a few paces without feeling very unsteady.  His wife does not feel comfortable home as they live on the second floor apartment.  I think there is a possibility that this patient may have Parkinson's.  He will need admission due to his inability to ambulate and take care of ADLs in his current home.    Final Clinical Impressions(s) / ED Diagnoses   Final diagnoses:  Ataxia  Lightheaded    ED Discharge Orders    None       Karen Kays Charna Elizabeth 07/14/17 1459    Davonna Belling, MD 07/14/17 1531

## 2017-07-14 NOTE — ED Notes (Signed)
Patient transported to MRI 

## 2017-07-14 NOTE — ED Notes (Signed)
While attempting to sit pt on side of bed to use urinal, pt stated he was too dizzy. Pt laid back down and stated he wanted to try again later when he was no longer dizzy.

## 2017-07-14 NOTE — ED Notes (Signed)
ED Provider at bedside. 

## 2017-07-14 NOTE — Plan of Care (Signed)
Progressing

## 2017-07-14 NOTE — ED Notes (Signed)
Attempted to ambulate pt with PA, pt unable to walk to the door of his room w/o losing his balance. Pt assisted back to bed.

## 2017-07-15 ENCOUNTER — Inpatient Hospital Stay (HOSPITAL_COMMUNITY): Payer: PPO

## 2017-07-15 ENCOUNTER — Observation Stay (HOSPITAL_COMMUNITY): Payer: PPO

## 2017-07-15 DIAGNOSIS — W19XXXA Unspecified fall, initial encounter: Secondary | ICD-10-CM

## 2017-07-15 DIAGNOSIS — I639 Cerebral infarction, unspecified: Secondary | ICD-10-CM | POA: Diagnosis not present

## 2017-07-15 DIAGNOSIS — E785 Hyperlipidemia, unspecified: Secondary | ICD-10-CM | POA: Diagnosis present

## 2017-07-15 DIAGNOSIS — I5030 Unspecified diastolic (congestive) heart failure: Secondary | ICD-10-CM | POA: Diagnosis not present

## 2017-07-15 DIAGNOSIS — R251 Tremor, unspecified: Secondary | ICD-10-CM | POA: Diagnosis present

## 2017-07-15 DIAGNOSIS — R1310 Dysphagia, unspecified: Secondary | ICD-10-CM | POA: Diagnosis present

## 2017-07-15 DIAGNOSIS — R29700 NIHSS score 0: Secondary | ICD-10-CM | POA: Diagnosis present

## 2017-07-15 DIAGNOSIS — Z823 Family history of stroke: Secondary | ICD-10-CM | POA: Diagnosis not present

## 2017-07-15 DIAGNOSIS — I6330 Cerebral infarction due to thrombosis of unspecified cerebral artery: Secondary | ICD-10-CM | POA: Diagnosis not present

## 2017-07-15 DIAGNOSIS — S065X9A Traumatic subdural hemorrhage with loss of consciousness of unspecified duration, initial encounter: Secondary | ICD-10-CM | POA: Diagnosis present

## 2017-07-15 DIAGNOSIS — N401 Enlarged prostate with lower urinary tract symptoms: Secondary | ICD-10-CM | POA: Diagnosis present

## 2017-07-15 DIAGNOSIS — W1830XA Fall on same level, unspecified, initial encounter: Secondary | ICD-10-CM | POA: Diagnosis present

## 2017-07-15 DIAGNOSIS — I951 Orthostatic hypotension: Secondary | ICD-10-CM

## 2017-07-15 DIAGNOSIS — R270 Ataxia, unspecified: Secondary | ICD-10-CM

## 2017-07-15 DIAGNOSIS — I6389 Other cerebral infarction: Secondary | ICD-10-CM | POA: Diagnosis not present

## 2017-07-15 DIAGNOSIS — R42 Dizziness and giddiness: Secondary | ICD-10-CM | POA: Diagnosis present

## 2017-07-15 DIAGNOSIS — R402142 Coma scale, eyes open, spontaneous, at arrival to emergency department: Secondary | ICD-10-CM | POA: Diagnosis present

## 2017-07-15 DIAGNOSIS — E119 Type 2 diabetes mellitus without complications: Secondary | ICD-10-CM | POA: Diagnosis present

## 2017-07-15 DIAGNOSIS — R55 Syncope and collapse: Secondary | ICD-10-CM | POA: Diagnosis present

## 2017-07-15 DIAGNOSIS — R262 Difficulty in walking, not elsewhere classified: Secondary | ICD-10-CM | POA: Diagnosis not present

## 2017-07-15 DIAGNOSIS — R402362 Coma scale, best motor response, obeys commands, at arrival to emergency department: Secondary | ICD-10-CM | POA: Diagnosis present

## 2017-07-15 DIAGNOSIS — K219 Gastro-esophageal reflux disease without esophagitis: Secondary | ICD-10-CM | POA: Diagnosis present

## 2017-07-15 DIAGNOSIS — I110 Hypertensive heart disease with heart failure: Secondary | ICD-10-CM | POA: Diagnosis present

## 2017-07-15 DIAGNOSIS — M21371 Foot drop, right foot: Secondary | ICD-10-CM | POA: Diagnosis present

## 2017-07-15 DIAGNOSIS — I10 Essential (primary) hypertension: Secondary | ICD-10-CM

## 2017-07-15 DIAGNOSIS — Y92002 Bathroom of unspecified non-institutional (private) residence single-family (private) house as the place of occurrence of the external cause: Secondary | ICD-10-CM | POA: Diagnosis not present

## 2017-07-15 DIAGNOSIS — R350 Frequency of micturition: Secondary | ICD-10-CM | POA: Diagnosis present

## 2017-07-15 DIAGNOSIS — E039 Hypothyroidism, unspecified: Secondary | ICD-10-CM | POA: Diagnosis present

## 2017-07-15 DIAGNOSIS — Z7984 Long term (current) use of oral hypoglycemic drugs: Secondary | ICD-10-CM | POA: Diagnosis not present

## 2017-07-15 DIAGNOSIS — S065XAA Traumatic subdural hemorrhage with loss of consciousness status unknown, initial encounter: Secondary | ICD-10-CM | POA: Diagnosis present

## 2017-07-15 DIAGNOSIS — R2681 Unsteadiness on feet: Secondary | ICD-10-CM | POA: Diagnosis present

## 2017-07-15 DIAGNOSIS — I6340 Cerebral infarction due to embolism of unspecified cerebral artery: Secondary | ICD-10-CM | POA: Diagnosis present

## 2017-07-15 DIAGNOSIS — R402252 Coma scale, best verbal response, oriented, at arrival to emergency department: Secondary | ICD-10-CM | POA: Diagnosis present

## 2017-07-15 DIAGNOSIS — I5032 Chronic diastolic (congestive) heart failure: Secondary | ICD-10-CM | POA: Diagnosis present

## 2017-07-15 DIAGNOSIS — R29810 Facial weakness: Secondary | ICD-10-CM | POA: Diagnosis present

## 2017-07-15 HISTORY — DX: Traumatic subdural hemorrhage with loss of consciousness status unknown, initial encounter: S06.5XAA

## 2017-07-15 HISTORY — DX: Traumatic subdural hemorrhage with loss of consciousness of unspecified duration, initial encounter: S06.5X9A

## 2017-07-15 LAB — LIPID PANEL
Cholesterol: 141 mg/dL (ref 0–200)
HDL: 30 mg/dL — ABNORMAL LOW (ref >40)
LDL Cholesterol: 88 mg/dL (ref 0–99)
Total CHOL/HDL Ratio: 4.7 RATIO
Triglycerides: 116 mg/dL (ref <150)
VLDL: 23 mg/dL (ref 0–40)

## 2017-07-15 LAB — GLUCOSE, CAPILLARY
Glucose-Capillary: 83 mg/dL (ref 65–99)
Glucose-Capillary: 106 mg/dL — ABNORMAL HIGH (ref 65–99)
Glucose-Capillary: 173 mg/dL — ABNORMAL HIGH (ref 65–99)
Glucose-Capillary: 131 mg/dL — ABNORMAL HIGH (ref 65–99)

## 2017-07-15 LAB — HEMOGLOBIN A1C
Hgb A1c MFr Bld: 6 % — ABNORMAL HIGH (ref 4.8–5.6)
Mean Plasma Glucose: 125.5 mg/dL

## 2017-07-15 MED ORDER — ATORVASTATIN CALCIUM 40 MG PO TABS: 40.0000 mg | ORAL_TABLET | Freq: Every day | ORAL | Status: DC

## 2017-07-15 MED ORDER — LABETALOL HCL 5 MG/ML IV SOLN
10.0000 mg | Freq: Four times a day (QID) | INTRAVENOUS | Status: DC | PRN
  Administered 2017-07-16: 10 mg via INTRAVENOUS
  Filled 2017-07-15: qty 4

## 2017-07-15 MED ORDER — ATORVASTATIN CALCIUM 80 MG PO TABS
80.0000 mg | ORAL_TABLET | Freq: Every day | ORAL | Status: DC
  Administered 2017-07-15: 80 mg via ORAL
  Filled 2017-07-15: qty 1

## 2017-07-15 MED ORDER — IOPAMIDOL (ISOVUE-370) INJECTION 76%
INTRAVENOUS | Status: AC
  Administered 2017-07-15: 50 mL
  Filled 2017-07-15: qty 50

## 2017-07-15 NOTE — Progress Notes (Signed)
PROGRESS NOTE  Justin Gomez QQV:[id number] DOB: 1940/11/07 DOA: 07/14/2017 PCP: Jani Gravel, MD  HPI/Recap of past 24 hours: Justin Gomez is a 77 y.o. male with medical history significant of non-insulin-dependent diabetes mellitus, hypertension, hyperlipidemia, hypothyroidism, postural hypotension who presents from home with an unsteady gait, a fall and trauma to the back of his head. Head CT no contrast done 07/14/17 in the ED did not reveal any acute intracranial abnormalities however, MRI brain done today 07/15/17 revealed smal cva left parietal and left subdural hematoma 49mm in diameter. Neurology consulted. CVA workup. Hold asa, chemical DVT prophylaxis and started lipitor, labetalol prn for SBP>80 or dBP>105.  Ambulated in the room without falling and with no dizziness. Speech is not slurred. Neurology following. We appreciate recommendations.   Assessment/Plan: Principal Problem:   Postural dizziness with near syncope Active Problems:   Essential hypertension   Type 2 diabetes mellitus without complication, without long-term current use of insulin ([facility])   Gastroesophageal reflux disease   Postural hypotension  Acute CVA left parietal/Left Subdural hematoma, poa -CT head no contrast 07/14/17 negative -MRI brain 07/15/17 revealed small cva left parietal and left subdural hematoma -stop asa, lovenox -start lipitor, labetalol prn sbp>180 or dbp>105 -2D echo with bubble study, lipid panel, A1C -carotids u/s -EKG sinuks rhythm -continue telemetry -neurology consulted and following -PT/OT -Fall precaution -neurochecks every 4 hours -Monitor closely; low threshold for transferring to telemetry floor-Defer to neurology.  Postural dizziness with near syncope -unclear etiology -neurology consulted  Essential hypertension -Avoid hypotension in the setting of acute CVA -Avoid uncontrolled hypertension in the settin of subdural hematoma -labetalol IV prn for sbp>180 or  dbp>105 -Home meds: losartan  Type 2 diabetes mellitus without complication without long-term continuous use of insulin -A1C -ISS -on metformin and glipizide at home  Postural hypotension -orthosthatic vital signs -monitor vital signs closely  BPH/overactive bladder -stable -tamsulosin, finasteride -monitor urine output -myrbetricq for overactive bladder  Hypothyroidism -levothyroxine  GERD -stable -ranitidine  Code Status: Full  Family Communication: Daughter at bedside  Disposition Plan: will stay another midnight due to acute CVA and acute subdural hematoma.   SEVERITY: High risk for decompensation due to acute cva complicated by subdural hematoma, poa.   Consultants:  Neurology  Procedures:  none  Antimicrobials:  none  DVT prophylaxis:  SCDs   Objective: Vitals:   07/14/17 2034 07/14/17 2340 07/15/17 0403 07/15/17 0500  BP: (!) 160/96 (!) 163/77 (!) 164/81   Pulse: 97 75 75   Resp:  16 16   Temp:  97.7 F (36.5 C) (!) 97.5 F (36.4 C)   TempSrc:  Oral Oral   SpO2:  99% 95%   Weight:    82.8 kg (182 lb 8.7 oz)  Height:        Intake/Output Summary (Last 24 hours) at 07/15/2017 1435 Last data filed at 07/15/2017 1059 Gross per 24 hour  Intake 2918.33 ml  Output 2400 ml  Net 518.33 ml   Filed Weights   07/14/17 1230 07/14/17 1852 07/15/17 0500  Weight: 81.6 kg (180 lb) 83.1 kg (183 lb 4.8 oz) 82.8 kg (182 lb 8.7 oz)    Exam:   General:  77 yo CM WD WN NAD A&O x 3   Cardiovascular: RRR no rubs or gallops  Respiratory: CTA no wheezes or rales   Abdomen: soft NT ND NBS x4  Musculoskeletal: non focal. No LE edema. 5/5 strength in upper extremities bilaterally. Ambulates but slowly.    Skin: no  rash or open lesions noted  Psychiatry: Mood is appropriate for condition and setting.   Data Reviewed: CBC: Recent Labs  Lab 07/14/17 1211 07/14/17 1535  WBC 10.5 7.8  NEUTROABS 9.2*  --   HGB 14.7 13.1  HCT 42.6 38.5*  MCV  87.3 87.5  PLT 126* 782*   Basic Metabolic Panel: Recent Labs  Lab 07/14/17 1211 07/14/17 1535  NA 139  --   K 3.9  --   CL 103  --   CO2 26  --   GLUCOSE 159*  --   BUN 16  --   CREATININE 0.73 0.79  CALCIUM 8.9  --    GFR: Estimated Creatinine Clearance: 81.1 mL/min (by C-G formula based on SCr of 0.79 mg/dL). Liver Function Tests: Recent Labs  Lab 07/14/17 1211  AST 17  ALT 18  ALKPHOS 65  BILITOT 0.7  PROT 6.1*  ALBUMIN 3.7   No results for input(s): LIPASE, AMYLASE in the last 168 hours. No results for input(s): AMMONIA in the last 168 hours. Coagulation Profile: No results for input(s): INR, PROTIME in the last 168 hours. Cardiac Enzymes: No results for input(s): CKTOTAL, CKMB, CKMBINDEX, TROPONINI in the last 168 hours. BNP (last 3 results) No results for input(s): PROBNP in the last 8760 hours. HbA1C: Recent Labs    07/14/17 1525  HGBA1C 6.0*   CBG: Recent Labs  Lab 07/14/17 1127 07/14/17 1750 07/14/17 2130 07/15/17 0536 07/15/17 0836  GLUCAP 157* 166* 133* 83 106*   Lipid Profile: No results for input(s): CHOL, HDL, LDLCALC, TRIG, CHOLHDL, LDLDIRECT in the last 72 hours. Thyroid Function Tests: Recent Labs    07/14/17 1211  TSH 2.491   Anemia Panel: No results for input(s): VITAMINB12, FOLATE, FERRITIN, TIBC, IRON, RETICCTPCT in the last 72 hours. Urine analysis:    Component Value Date/Time   COLORURINE YELLOW 07/14/2017 [place] 07/14/2017 1137   LABSPEC 1.014 07/14/2017 1137   PHURINE 7.0 07/14/2017 1137   GLUCOSEU NEGATIVE 07/14/2017 1137   HGBUR NEGATIVE 07/14/2017 1137   BILIRUBINUR NEGATIVE 07/14/2017 1137   KETONESUR NEGATIVE 07/14/2017 1137   PROTEINUR NEGATIVE 07/14/2017 1137   NITRITE NEGATIVE 07/14/2017 1137   LEUKOCYTESUR NEGATIVE 07/14/2017 1137   Sepsis Labs: @LABRCNTIP (procalcitonin:4,lacticidven:4)  )No results found for this or any previous visit (from the past 240 hour(s)).     Studies: Mr Brain Wo Contrast  Result Date: 07/15/2017 CLINICAL DATA:  Syncope.  Unsteady gait. EXAM: MRI HEAD WITHOUT CONTRAST TECHNIQUE: Multiplanar, multiecho pulse sequences of the brain and surrounding structures were obtained without intravenous contrast. COMPARISON:  Brain MRI 09/15/2016 FINDINGS: Brain: 8 mm acute infarct in the left parietal cortex. Trace subdural hematoma along the posterior left cerebral convexity measuring only 2 mm in thickness. See FLAIR and susceptibility weighted imaging. Minimal presumed chronic microvascular ischemic type changes. Ventriculomegaly attributed to central volume loss. Symmetric sulci diffusely. There are 2 foci of susceptibility in the right occipital lobe without associated edema, consistent with remote small hemorrhagic insults, nonspecific. Vascular: Major flow voids are preserved. Skull and upper cervical spine: Negative. No evidence of marrow lesion. Sinuses/Orbits: Negative Critical Value/emergent results were called by telephone at the time of interpretation on 07/15/2017 at 1:50 pm to Dr. Randa Spike , who verbally acknowledged these results. IMPRESSION: 1. 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 2. Trace subdural hematoma along the left occipital convexity measuring only 2 mm in thickness. 3. Central volume loss and ventriculomegaly. Electronically Signed   By:  Monte Fantasia M.D.   On: 07/15/2017 13:56    Scheduled Meds: . atorvastatin  40 mg Oral q1800  . famotidine  20 mg Oral BID  . finasteride  5 mg Oral Daily  . glipiZIDE  5 mg Oral Daily  . insulin aspart  0-15 Units Subcutaneous TID WC  . levothyroxine  75 mcg Oral QAC breakfast  . losartan  25 mg Oral Daily  . mirabegron ER  25 mg Oral Daily  . pantoprazole  40 mg Oral Daily  . sodium chloride flush  3 mL Intravenous Q12H  . tamsulosin  0.4 mg Oral Daily    Continuous Infusions:   LOS: 0 days     Kayleen Memos, MD [facility] Pager  [phone]  If 7PM-7AM, please contact night-coverage [email or web address] Password [facility] 07/15/2017, 2:35 PM

## 2017-07-15 NOTE — Evaluation (Signed)
Physical Therapy Evaluation Patient Details Name: Justin Gomez MRN: [id number] DOB: 1940/06/27 Today's Date: 07/15/2017   History of Present Illness  77 y.o. male admitted on 07/14/17 for fall at home with dizziness.  MRI revealed L parietal cortex infarct and L occipital convexity SDH.  Pt with significant PMH of lumbar herniated disc, HTN, DM, diverticulosis, L shoulder surgery, and R TKA.  Clinical Impression  Pt is returning back to near baseline level of mobility.  He has a bit of a shuffling gait pattern with trunk forward of his feet, counter balanced by his arms.  He does have a bit of unsteadiness when challenged to turn head, step around and over obstacles in the hallway.  Gross, basic vestibular assessment was negative for any vestibular pathology.  Pt did not report dizziness during our mobility today. He is safe to return home when MD deems appropriate, and PT will follow only acutely for high level gait and balance activities.   PT to follow acutely for deficits listed below.     Follow Up Recommendations No PT follow up;Supervision - Intermittent    Equipment Recommendations  None recommended by PT    Recommendations for Other Services   NA     Precautions / Restrictions Precautions Precautions: Fall      Mobility  Bed Mobility Overal bed mobility: Independent                Transfers Overall transfer level: Modified independent                  Ambulation/Gait Ambulation/Gait assistance: Supervision;Min guard Ambulation Distance (Feet): 300 Feet Assistive device: None Gait Pattern/deviations: Step-through pattern;Shuffle;Staggering left;Staggering right     General Gait Details: staggering when challenged a requiring min guard assist for balance. Very flexed, shuffling pattern, better with increased distance, also has that forward trunk and counter balance with arms a bit posterior.   Stairs Stairs: Yes Stairs assistance: Supervision;Min  guard Stair Management: One rail Left;Alternating pattern;Forwards Number of Stairs: 10 General stair comments: Pt able to go up easily , down was a little slower and less controlled and required min guard assist for safety as he looked a bit anteriorly preferenced and if he LOB he would go down forward.       Modified Rankin (Stroke Patients Only) Modified Rankin (Stroke Patients Only) Pre-Morbid Rankin Score: No symptoms Modified Rankin: Moderately severe disability     Balance Overall balance assessment: Needs assistance Sitting-balance support: Feet supported;No upper extremity supported Sitting balance-Leahy Scale: Good     Standing balance support: No upper extremity supported Standing balance-Leahy Scale: Good                   Standardized Balance Assessment Standardized Balance Assessment : Dynamic Gait Index   Dynamic Gait Index Level Surface: Normal Gait with Horizontal Head Turns: Mild Impairment Gait with Vertical Head Turns: Mild Impairment Step Over Obstacle: Moderate Impairment Step Around Obstacles: Mild Impairment Steps: Mild Impairment       Pertinent Vitals/Pain Pain Assessment: No/denies pain    Home Living Family/patient expects to be discharged to:: Private residence Living Arrangements: Spouse/significant other;Children(wife and daughter) Available Help at Discharge: Family;Available 24 hours/day Type of Home: House Home Access: Stairs to enter Entrance Stairs-Rails: Left Entrance Stairs-Number of Steps: 10 Home Layout: One level Home Equipment: None      Prior Function Level of Independence: Independent         Comments: Pt drives, 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     Hand Dominance   Dominant Hand: Right    Extremity/Trunk Assessment   Upper Extremity Assessment Upper Extremity Assessment: Overall WFL for tasks assessed    Lower Extremity Assessment Lower Extremity  Assessment: Overall WFL for tasks assessed    Cervical / Trunk Assessment Cervical / Trunk Assessment: Kyphotic;Other exceptions(forward head) Cervical / Trunk Exceptions: forward head, rounded shoulders  Communication   Communication: No difficulties  Cognition Arousal/Alertness: Awake/alert Behavior During Therapy: WFL for tasks assessed/performed Overall Cognitive Status: Within Functional Limits for tasks assessed(not specifically tested, but conversation normal)                                        General Comments General comments (skin integrity, edema, etc.): Basic vestibular assessment preformed and was negative. Pt report feeling as though the room was tipping when he fell, no tinnitus, no fullness, no recent URI, (+) head trauma, no blurry vision, does not wear glasses.  No reports of dizziness throughout our session.  Pt rolled bil with no signs of nystagmus or dizziness and transition from sit to supine with no reports of dizziness (I am not sure his neck ROM would have allowed for full dix hallpike).  Visual tracking normal, and some difficulty with convergence.  But from everything I did with him, this is unlikely to be vertigo or vestibular in nature.         Assessment/Plan    PT Assessment Patient needs continued PT services  PT Problem List Decreased balance       PT Treatment Interventions DME instruction;Gait training;Stair training;Functional mobility training;Therapeutic activities;Balance training;Therapeutic exercise;Patient/family education    PT Goals (Current goals can be found in the Care Plan section)  Acute Rehab PT Goals Patient Stated Goal: to figure out what went wrong, return to PLOF PT Goal Formulation: With patient Time For Goal Achievement: 07/29/17 Potential to Achieve Goals: Good    Frequency Min 4X/week           AM-PAC PT "6 Clicks" Daily Activity  Outcome Measure Difficulty turning over in bed (including  adjusting bedclothes, sheets and blankets)?: None Difficulty moving from lying on back to sitting on the side of the bed? : None Difficulty sitting down on and standing up from a chair with arms (e.g., wheelchair, bedside commode, etc,.)?: None Help needed moving to and from a bed to chair (including a wheelchair)?: None Help needed walking in hospital room?: A Little Help needed climbing 3-5 steps with a railing? : A Little 6 Click Score: 22    End of Session   Activity Tolerance: Patient tolerated treatment well Patient left: in bed;with call bell/phone within reach;with family/visitor present   PT Visit Diagnosis: Unsteadiness on feet (R26.81)    Time: 1510-1531 PT Time Calculation (min) (ACUTE ONLY): 21 min   Charges:         Wells Guiles B. Reyhan Moronta, PT, DPT [phone]   PT Evaluation $PT Eval Moderate Complexity: 1 Mod     07/15/2017, 3:48 PM

## 2017-07-15 NOTE — Consult Note (Addendum)
Referring Physician: Kayleen Memos, DO    Chief Complaint: New Stroke  HPI: Justin Gomez is an 77 y.o. male with a past medical history of non-insulin-dependent diabetes mellitus, hypertension, dyslipidemia, hypothyroidism, postural hypotension and vertigo who presented to the from home with unsteady gait status post a fall and trauma to the back of his head.  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   Neurology was consulted for further evaluation.  During assessment, patient stated that he  had several years history of bouts of postural hypotension and vertigo which was treated with meclizine,  the last time he took meclizine was over a year ago, prior to taking a dose yesterday.  He has not had incidences of dizziness in the last several years, but this week he has had increased episodes  of dizziness and lightheadedmess with occasional associated nausea, and basilar headache without complaints of blurred vision.  Yesterday prior to admission he had many bouts of vertigo with dizziness and tried several times to get out of bed, but could not because of the persistence of the dizziness. When symptoms somewhat resolved he was able to get up went to the bathroom. In the bathroom, he felt very dizzy lightheaded and fell down in the bathroom hitting his head on the corner of the bathtub on the way down.  Per family he did not want to get up due to the severity of his dizziness.  EMS Was called and pt was brought to the hospital On admission to the ER patient did not have any acute neurological symptoms except for word finding difficulties left facial droop which have been ongoing for several years now.  On assessment, patient had a mask look on his face and upon further  investigation, patient and family both admit to patient having incidences of hallucinating and acting out his dreams several years ago. On assessment,  with fine motor tremors at rest,   and more cogwheeling with movement on the right upper extremity than the left  LSN: yesterday tPA Given: No: outside of time windonw  Premorbid modified Rankin scale (mRS): 0   Past Medical History:  Diagnosis Date  . BPH (benign prostatic hypertrophy)   . Diabetes mellitus   . Diverticulosis   . Gastroesophageal reflux disease 04/13/2016  . Hiatal hernia   . Hyperlipidemia   . Hypertension   . Hypothyroidism   . Lumbar herniated disc    L4    Past Surgical History:  Procedure Laterality Date  . APPENDECTOMY  1956  . KNEE SURGERY  2009   right  . SHOULDER SURGERY  2010   left    Family History  Problem Relation Age of Onset  . Cerebral aneurysm Father   . Hypertension Father   . Diabetes Mother   . Hypertension Brother   . Diabetes Paternal Grandmother   . Colon cancer Neg Hx    Social History:  reports that  has never smoked. he has never used smokeless tobacco. He reports that he drinks about 1.8 oz of alcohol per week. He reports that he does not use drugs.  Allergies: No Known Allergies  Medications: Current Facility-Administered Medications:  .  acetaminophen (TYLENOL) tablet 650 mg, 650 mg, Oral, Q6H PRN **OR** acetaminophen (  TYLENOL) suppository 650 mg, 650 mg, Rectal, Q6H PRN, Lady Deutscher, MD .  atorvastatin (LIPITOR) tablet 80 mg, 80 mg, Oral, q1800, Hall, Carole N, DO .  famotidine (PEPCID) tablet 20 mg, 20 mg, Oral, BID, Lady Deutscher, MD, 20 mg at 07/15/17 0902 .  finasteride (PROSCAR) tablet 5 mg, 5 mg, Oral, Daily, Lady Deutscher, MD, 5 mg at 07/15/17 0900 .  glipiZIDE (GLUCOTROL) tablet 5 mg, 5 mg, Oral, Daily, Lady Deutscher, MD, 5 mg at 07/15/17 0900 .  insulin aspart (novoLOG) injection 0-15 Units, 0-15 Units, Subcutaneous, TID WC, Lady Deutscher, MD, 3 Units at 07/14/17 1755 .  labetalol (NORMODYNE,TRANDATE) injection 10 mg, 10 mg, Intravenous, Q6H PRN, Hall, Carole N, DO .  levothyroxine (SYNTHROID, LEVOTHROID) tablet 75 mcg, 75 mcg, Oral, QAC breakfast, Lady Deutscher, MD, 75 mcg at 07/15/17 [phone] .  losartan (COZAAR) tablet 25 mg, 25 mg, Oral, Daily, Lady Deutscher, MD, 25 mg at 07/15/17 0901 .  mirabegron ER (MYRBETRIQ) tablet 25 mg, 25 mg, Oral, Daily, Lady Deutscher, MD, 25 mg at 07/15/17 0900 .  ondansetron (ZOFRAN) tablet 4 mg, 4 mg, Oral, Q6H PRN **OR** ondansetron (ZOFRAN) injection 4 mg, 4 mg, Intravenous, Q6H PRN, Lady Deutscher, MD .  oxyCODONE (Oxy IR/ROXICODONE) immediate release tablet 5 mg, 5 mg, Oral, Q4H PRN, Lady Deutscher, MD .  pantoprazole (PROTONIX) EC tablet 40 mg, 40 mg, Oral, Daily, Lady Deutscher, MD, 40 mg at 07/15/17 0900 .  sodium chloride flush (NS) 0.9 % injection 3 mL, 3 mL, Intravenous, Q12H, Evangeline Gula, Wyatt Haste, MD .  tamsulosin [facility]) capsule 0.4 mg, 0.4 mg, Oral, Daily, Lady Deutscher, MD, 0.4 mg at 07/15/17 0901  ROS: As per history of present illness otherwise 10 point systems reviewed and are negative    Physical Examination: Blood pressure (!) 145/63, pulse 81, temperature (!) 97.5 F (36.4 C), temperature source Oral, resp. rate 18, height '5\' 10"'$  (1.778 m), weight 82.8 kg (182 lb 8.7 oz), SpO2 99 %. HEENT-  Normocephalic, no lesions, without obvious abnormality.  Normal external eye and conjunctiva.  Masked  face appearance NECK: Stiff - due long hx of DDD Cardiovascular- S1-S2 audible, pulses palpable throughout   Lungs-no rhonchi or wheezing noted, no excessive working breathing.  Saturations within normal limits Abdomen- All 4 quadrants palpated and nontender Extremities- Warm, dry and intact Musculoskeletal-no joint tenderness, deformity or swelling Skin-warm and dry, no hyperpigmentation, vitiligo, or suspicious lesions  Neurological  Examination Mental Status: Alert, oriented, thought content appropriate.  Speech fluent without evidence of aphasia.  Able to follow 3 step commands without difficulty.  Patient with some word finding difficulties Cranial Nerves: II: Discs flat bilaterally; Visual fields grossly normal,  III,IV, VI: ptosis not present, extra-ocular motions intact bilaterally pupils equal, round, reactive to light and accommodation V,VII: Patient with mask face, mild left facial droop, smile symmetric, facial light touch sensation normal bilaterally VIII: Hearing intact to voice IX,X: uvula rises symmetrically XI: bilateral shoulder shrug XII: midline tongue extension Motor: Patient with  cogwheeling in right extremity greater than left. +bradykinesia on fingertaps Right : Upper extremity   5/5    Left:     Upper extremity   5/5  Lower extremity   5/5     Lower extremity   5/5 Tone and bulk:normal tone throughout; no atrophy noted Sensory: Pinprick and light touch intact throughout, bilaterally Deep Tendon Reflexes: 2+ and symmetric throughout Plantars: Right: downgoing   Left:  downgoing Cerebellar: normal finger-to-nose, normal rapid alternating movements and normal heel-to-shin test Gait: Fast unsteady gait, visible shuffling  Results for orders placed or performed during the hospital encounter of 07/14/17 (from the past 48 hour(s))  CBG monitoring, ED     Status: Abnormal   Collection Time: 07/14/17 11:27 AM  Result Value Ref Range   Glucose-Capillary 157 (H) 65 - 99 mg/dL  Urinalysis, Routine w reflex microscopic     Status: None   Collection Time: 07/14/17 11:37 AM  Result Value Ref Range   Color, Urine YELLOW YELLOW   APPearance CLEAR CLEAR   Specific Gravity, Urine 1.014 1.005 - 1.030   pH 7.0 5.0 - 8.0   Glucose, UA NEGATIVE NEGATIVE mg/dL   Hgb urine dipstick NEGATIVE NEGATIVE   Bilirubin Urine NEGATIVE NEGATIVE   Ketones, ur NEGATIVE NEGATIVE mg/dL   Protein, ur NEGATIVE NEGATIVE mg/dL    Nitrite NEGATIVE NEGATIVE   Leukocytes, UA NEGATIVE NEGATIVE  CBC with Differential     Status: Abnormal   Collection Time: 07/14/17 12:11 PM  Result Value Ref Range   WBC 10.5 4.0 - 10.5 K/uL   RBC 4.88 4.22 - 5.81 MIL/uL   Hemoglobin 14.7 13.0 - 17.0 g/dL   HCT 42.6 39.0 - 52.0 %   MCV 87.3 78.0 - 100.0 fL   MCH 30.1 26.0 - 34.0 pg   MCHC 34.5 30.0 - 36.0 g/dL   RDW 13.1 11.5 - 15.5 %   Platelets 126 (L) 150 - 400 K/uL   Neutrophils Relative % 87 %   Neutro Abs 9.2 (H) 1.7 - 7.7 K/uL   Lymphocytes Relative 7 %   Lymphs Abs 0.8 0.7 - 4.0 K/uL   Monocytes Relative 5 %   Monocytes Absolute 0.5 0.1 - 1.0 K/uL   Eosinophils Relative 1 %   Eosinophils Absolute 0.1 0.0 - 0.7 K/uL   Basophils Relative 0 %   Basophils Absolute 0.0 0.0 - 0.1 K/uL  Comprehensive metabolic panel     Status: Abnormal   Collection Time: 07/14/17 12:11 PM  Result Value Ref Range   Sodium 139 135 - 145 mmol/L   Potassium 3.9 3.5 - 5.1 mmol/L   Chloride 103 101 - 111 mmol/L   CO2 26 22 - 32 mmol/L   Glucose, Bld 159 (H) 65 - 99 mg/dL   BUN 16 6 - 20 mg/dL   Creatinine, Ser 0.73 0.61 - 1.24 mg/dL   Calcium 8.9 8.9 - 10.3 mg/dL   Total Protein 6.1 (L) 6.5 - 8.1 g/dL   Albumin 3.7 3.5 - 5.0 g/dL   AST 17 15 - 41 U/L   ALT 18 17 - 63 U/L   Alkaline Phosphatase 65 38 - 126 U/L   Total Bilirubin 0.7 0.3 - 1.2 mg/dL   GFR calc non Af Amer >60 >60 mL/min   GFR calc Af Amer >60 >60 mL/min    Comment: (NOTE) The eGFR has been calculated using the CKD EPI equation. This calculation has not been validated in all clinical situations. eGFR's persistently <60 mL/min signify possible Chronic Kidney Disease.    Anion gap 10 5 - 15  TSH     Status: None   Collection Time: 07/14/17 12:11 PM  Result Value Ref Range   TSH 2.491 0.350 - 4.500 uIU/mL    Comment: Performed by a 3rd Generation assay with a functional sensitivity of <=0.01 uIU/mL.  Hemoglobin A1c     Status: Abnormal   Collection Time: 07/14/17  3:25 PM  Result Value Ref Range   Hgb A1c MFr Bld 6.0 (H) 4.8 - 5.6 %    Comment: (NOTE) Pre diabetes:          5.7%-6.4% Diabetes:              >6.4% Glycemic control for   <7.0% adults with diabetes    Mean Plasma Glucose 125.5 mg/dL  CBC     Status: Abnormal   Collection Time: 07/14/17  3:35 PM  Result Value Ref Range   WBC 7.8 4.0 - 10.5 K/uL   RBC 4.40 4.22 - 5.81 MIL/uL   Hemoglobin 13.1 13.0 - 17.0 g/dL   HCT 38.5 (L) 39.0 - 52.0 %   MCV 87.5 78.0 - 100.0 fL   MCH 29.8 26.0 - 34.0 pg   MCHC 34.0 30.0 - 36.0 g/dL   RDW 12.9 11.5 - 15.5 %   Platelets 124 (L) 150 - 400 K/uL  Creatinine, serum     Status: None   Collection Time: 07/14/17  3:35 PM  Result Value Ref Range   Creatinine, Ser 0.79 0.61 - 1.24 mg/dL   GFR calc non Af Amer >60 >60 mL/min   GFR calc Af Amer >60 >60 mL/min    Comment: (NOTE) The eGFR has been calculated using the CKD EPI equation. This calculation has not been validated in all clinical situations. eGFR's persistently <60 mL/min signify possible Chronic Kidney Disease.   Glucose, capillary     Status: Abnormal   Collection Time: 07/14/17  5:50 PM  Result Value Ref Range   Glucose-Capillary 166 (H) 65 - 99 mg/dL  Glucose, capillary     Status: Abnormal   Collection Time: 07/14/17  9:30 PM  Result Value Ref Range   Glucose-Capillary 133 (H) 65 - 99 mg/dL  Glucose, capillary     Status: None   Collection Time: 07/15/17  5:36 AM  Result Value Ref Range   Glucose-Capillary 83 65 - 99 mg/dL  Glucose, capillary     Status: Abnormal   Collection Time: 07/15/17  8:36 AM  Result Value Ref Range   Glucose-Capillary 106 (H) 65 - 99 mg/dL   Dg Shoulder Right 07/14/2017 IMPRESSION:  Degenerative change without acute abnormality.  Ct Head Wo Contrast 07/14/2017 IMPRESSION:  1. No evidence of acute intracranial or cervical spine injury.  2. 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 The bulky spurs encroach on the pharynx.   Ct  Cervical Spine Wo Contrast 07/14/2017 IMPRESSION:  1. No evidence of acute intracranial or cervical spine injury.  2. 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 The bulky spurs encroach on the pharynx.   Mr Brain Wo Contrast 07/15/2017 IMPRESSION:  1. 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  2. Trace subdural hematoma along the left occipital convexity measuring only 2 mm in thickness. 3. Central volume loss and ventriculomegaly.   Assessment: 77 y.o. male with a past medical history of non-insulin-dependent diabetes mellitus, hypertension, dyslipidemia, hypothyroidism, postural hypotension and vertigo who presented to the from home with unsteady gait status post a fall and trauma to the back of his head.  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   Neurology was consulted for further evaluation. On assessment, patient  had a mask look on his face and upon further investigation, patient and family both admit to patient having incidences of hallucinating and acting out his dreams several years ago. On assessment,  with fine motor tremors at rest,   and more cogwheeling with movement on the right upper extremity than the left. --Acute left parietal ischemic stroke and trace subdural hematoma in the left occipital convexity inpatient post traumatic fall.  The week before admission patient had persistent dizziness and basal headache with associated nausea preceding this fall yesterday.  It is unclear if the symptoms were associated with a stroke or if the stroke caused the patient to have a fall prior to admission.  Patient will have a full stroke workup with physical therapy for evaluation of vertigo.  We will DISCONTINUE aspirin, statin or heparin at this  time due to subdural hematoma.  Recommend consulting neurosurgery for further evaluation treatment of subdural hematoma Stroke Risk Factors - diabetes mellitus, hyperlipidemia and hypertension.  Mother with hx of TIA and stroke --Vertigo --Parkinsonian symptoms-patient with several years history of mask face, hallucination with acting out dreams, fine tremors at rest and noted cog wheeling in extremities.  Patient will benefit from evaluation with movement disorder specialist outpatient.  Differentials include PSP due to imaging finding of progressive midbrain atrophy compared to the scan from 2005. --Hypertension/Dyslipidemia --Diabetes mellitus --Hypothyroidism - tsh WNL   Plan: -HgbA1c, fasting lipid panel -CTA head- PT consult, OT consult, Speech consult -Echocardiogram -Carotid dopplers -Prophylactic therapy-None due to subdural hematoma -Risk factor modification -Telemetry monitoring and consider long term tele monitoring -Frequent neuro checks -Fall precautions -Consider NSGY consult if clinical worsening. -Outpatient referral to movement disorder specialist neurologist Dr. Aquilla Solian at [facility]  for evaluation of parkinsonian symptoms - Maintain glycemic control and hemodynamic stability -Follow up MRI in 2 weeks (or sooner if clinical worsening) for the SDH follow up  -NO BLOOD THINNERS -- Jacob Moores DNP Neuro-hospitalist Team [phone] 07/15/2017, 3:22 PM   Attending Neurohospitalist Addendum Patient seen and examined with APP/Resident. Agree with the history and physical as documented above. Agree with the plan as documented, which I helped formulate. I have independently reviewed the chart, obtained history, review of systems and examined the patient. I have personally reviewed pertinent head/neck/spine imaging (CT/MRI). --Noncontrast CT of the head did not show any acute changes or bleeds.  But the MRI did reveal a 8 mm acute infarct in the left parietal  cortex as well as a 2 mm subdural hematoma in the left occipital convexity.  I reviewed this with the neuroradiologist to see whether this truly reflects an acute ischemic stroke or could this be some sort of a contusion since the subdural and the stroke on the same side is too much of a coincidence.  Neuroradiologist opines that this is a ischemic stroke and a subdural-2 separate entities.  Also, there is, compared to the MRI from 2005, progression of atrophy overall but more so in the midbrain as well.  Consideration should be given to entities such as progressive supranuclear palsy as a differential in the setting of parkinsonian symptoms. --It is a rather atypical location for a small embolic stroke to be  where it is, but it is not unlikely. --I agree with the recommendations above.  I have edited the note to reflect my recommendations with the stroke workup as well.  Please feel free to call with any questions. --- Amie Portland, MD [facility] Pager: [phone]  If 7pm to 7am, please  call on call as listed on AMION.

## 2017-07-16 ENCOUNTER — Inpatient Hospital Stay (HOSPITAL_COMMUNITY): Payer: PPO

## 2017-07-16 DIAGNOSIS — G239 Degenerative disease of basal ganglia, unspecified: Secondary | ICD-10-CM

## 2017-07-16 DIAGNOSIS — G903 Multi-system degeneration of the autonomic nervous system: Secondary | ICD-10-CM

## 2017-07-16 DIAGNOSIS — I6330 Cerebral infarction due to thrombosis of unspecified cerebral artery: Secondary | ICD-10-CM

## 2017-07-16 DIAGNOSIS — W19XXXA Unspecified fall, initial encounter: Secondary | ICD-10-CM

## 2017-07-16 DIAGNOSIS — I5030 Unspecified diastolic (congestive) heart failure: Secondary | ICD-10-CM

## 2017-07-16 HISTORY — DX: Cerebral infarction due to thrombosis of unspecified cerebral artery: I63.30

## 2017-07-16 LAB — CBC
HCT: 40.9 % (ref 39.0–52.0)
Hemoglobin: 14.4 g/dL (ref 13.0–17.0)
MCH: 31.2 pg (ref 26.0–34.0)
MCHC: 35.2 g/dL (ref 30.0–36.0)
MCV: 88.5 fL (ref 78.0–100.0)
Platelets: 140 10*3/uL — ABNORMAL LOW (ref 150–400)
RBC: 4.62 MIL/uL (ref 4.22–5.81)
RDW: 13.2 % (ref 11.5–15.5)
WBC: 11.1 10*3/uL — ABNORMAL HIGH (ref 4.0–10.5)

## 2017-07-16 LAB — COMPREHENSIVE METABOLIC PANEL
ALT: 17 U/L (ref 17–63)
AST: 17 U/L (ref 15–41)
Albumin: 3.8 g/dL (ref 3.5–5.0)
Alkaline Phosphatase: 67 U/L (ref 38–126)
Anion gap: 12 (ref 5–15)
BUN: 16 mg/dL (ref 6–20)
CO2: 24 mmol/L (ref 22–32)
Calcium: 9 mg/dL (ref 8.9–10.3)
Chloride: 105 mmol/L (ref 101–111)
Creatinine, Ser: 0.88 mg/dL (ref 0.61–1.24)
GFR calc Af Amer: >60 mL/min (ref >60)
GFR calc non Af Amer: >60 mL/min (ref >60)
Glucose, Bld: 160 mg/dL — ABNORMAL HIGH (ref 65–99)
Potassium: 3.5 mmol/L (ref 3.5–5.1)
Sodium: 141 mmol/L (ref 135–145)
Total Bilirubin: 1 mg/dL (ref 0.3–1.2)
Total Protein: 6.4 g/dL — ABNORMAL LOW (ref 6.5–8.1)

## 2017-07-16 LAB — ECHOCARDIOGRAM COMPLETE BUBBLE STUDY
Ao-asc: 30 cm
Area-P 1/2: 3.06 cm2
E decel time: 246 msec
E/e' ratio: 13.97
FS: 30 % (ref 28–44)
IVS/LV PW RATIO, ED: 1.19
LA ID, A-P, ES: 35 mm
LA diam end sys: 35 mm
LA diam index: 1.74 cm/m2
LA vol A4C: 74.8 ml
LA vol index: 39.2 mL/m2
LA vol: 79 mL
LV E/e' medial: 13.97
LV E/e'average: 13.97
LV PW d: 10.2 mm — AB (ref 0.6–1.1)
LV e' LATERAL: 6.42 cm/s
LVOT area: 3.46 cm2
LVOT diameter: 21 mm
Lateral S' vel: 15.2 cm/s
MV Dec: 246
MV Peak grad: 3 mmHg
MV pk A vel: 116 m/s
MV pk E vel: 89.7 m/s
P 1/2 time: 498 ms
P 1/2 time: 72 ms
Reg peak vel: 210 cm/s
TAPSE: 25.3 mm
TDI e' lateral: 6.42
TDI e' medial: 7.29
TR max vel: 210 cm/s

## 2017-07-16 LAB — TSH: TSH: 3.832 u[IU]/mL (ref 0.350–4.500)

## 2017-07-16 LAB — MAGNESIUM: Magnesium: 1.8 mg/dL (ref 1.7–2.4)

## 2017-07-16 LAB — GLUCOSE, CAPILLARY
Glucose-Capillary: 136 mg/dL — ABNORMAL HIGH (ref 65–99)
Glucose-Capillary: 169 mg/dL — ABNORMAL HIGH (ref 65–99)
Glucose-Capillary: 187 mg/dL — ABNORMAL HIGH (ref 65–99)

## 2017-07-16 LAB — PHOSPHORUS: Phosphorus: 3.1 mg/dL (ref 2.5–4.6)

## 2017-07-16 MED ORDER — ATORVASTATIN CALCIUM 20 MG PO TABS: 20.0000 mg | ORAL_TABLET | Freq: Every day | ORAL | Status: DC

## 2017-07-16 MED ORDER — CLOPIDOGREL BISULFATE 75 MG PO TABS: 75.0000 mg | ORAL_TABLET | Freq: Every day | ORAL | 0 refills | Status: DC

## 2017-07-16 MED ORDER — CLOPIDOGREL BISULFATE 75 MG PO TABS
75.0000 mg | ORAL_TABLET | Freq: Every day | ORAL | Status: DC
  Administered 2017-07-16: 75 mg via ORAL
  Filled 2017-07-16: qty 1

## 2017-07-16 MED ORDER — ATORVASTATIN CALCIUM 20 MG PO TABS: 20.0000 mg | ORAL_TABLET | Freq: Every day | ORAL | 0 refills | Status: AC

## 2017-07-16 NOTE — Discharge Summary (Addendum)
Discharge Summary  Justin Gomez FKC:[id number] DOB: 09/19/1940  PCP: Jani Gravel, MD  Admit date: 07/14/2017 Discharge date: 07/16/2017  Time spent: 25 minutes  Recommendations for Outpatient Follow-up:  1. Follow up with neurology 2. Follow up with movement disorder specialist neurologist Dr. Alfonso Patten. Tat at [facility] 3. Follow up with PCP 4. Take your medications as prescribed 5. 30 day cardiac event monitor  6. Continue oupatient PT 7. 2D echo 07/16/17 no shunt-negative bubble study  Discharge Diagnoses:  Active Hospital Problems   Diagnosis Date Noted  . Postural dizziness with near syncope 07/14/2017  . 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 Fall   . 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 Subdural hematoma ([place]) 07/15/2017  . Postural hypotension 01/22/2017  . Gastroesophageal reflux disease 04/13/2016  . Essential hypertension 11/29/2015  . Type 2 diabetes mellitus without complication, without long-term current use of insulin ([place]) 11/29/2015    Resolved Hospital Problems  No resolved problems to display.    Discharge Condition: stable   Diet recommendation: resume previous diet  Vitals:   07/16/17 1225 07/16/17 1607  BP: 126/60 (!) 123/49  Pulse: 68 72  Resp: 17 17  Temp: 97.8 F (36.6 C) 98.3 F (36.8 C)  SpO2: 96% 98%    History of present illness:  Justin Gomez a 77 y.o.malewith medical history significant ofnon-insulin-dependent diabetes mellitus, hypertension, hyperlipidemia, hypothyroidism, postural hypotension who presents from home with an unsteady gait, a fall and trauma to the back of his head. Head CT no contrast done 07/14/17 in the ED did not reveal any acute intracranial abnormalities however, MRI brain done today 07/15/17 revealed smal cva left parietal and left subdural hematoma 72mm in diameter. Neurology consulted. CVA workup. Hold asa, chemical DVT prophylaxis and started lipitor, labetalol prn for SBP>80 or  dBP>105. 07/15/17: Ambulated in the room without falling and with no dizziness. Speech is not slurred. Neurology followed.   07/16/17: PT evaluated and recommended outpatient PT. Will need to follow up with neurology and his PCP post hospitalization. Will need to have 30 day cardiac event monitoring.   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% -2D echo 07/16/17 no shunt -negative bubble study  Essential hypertension -Avoid hypotension in the setting of acute CVA -Avoid uncontrolled hypertension in the settin of subdural hematoma -labetalol IV prn for sbp>180 or dbp>105 -Home meds: losartan  Type 2 diabetes mellitus without complication without long-term continuous use of insulin -A1C 6.0 -ISS -on metformin and glipizide at home  Postural hypotension -orthosthatic vital signs -monitor vital signs closely  BPH/overactive bladder -stable -tamsulosin, finasteride -monitor urine  output -myrbetricq for overactive bladder  Hypothyroidism -levothyroxine  GERD -stable -ranitidine   Procedures:  none  Consultations:  neurology  Discharge Exam: BP (!) 123/49 (BP Location: Right Arm)   Pulse 72   Temp 98.3 F (36.8 C) (Oral)   Resp 17   Ht 5\' 10"  (1.778 m)   Wt 81.2 kg (179 lb 0.2 oz)   SpO2 98%   BMI 25.69 kg/m   General: 77 yo CM WD WN NAD A&O  x3 Cardiovascular: RRR no rubs or gallops Respiratory: CTA no wheezes or rales  Discharge Instructions You were cared for by a hospitalist during your hospital stay. If you have any questions about your discharge medications or the care you received while you were in the hospital after you are discharged, you can call the unit and asked to speak with the hospitalist on call if the hospitalist that took care of you is not available. Once you are discharged, your primary care physician will handle any further medical issues. Please note that NO REFILLS for any discharge medications will be authorized once you are discharged, as it is imperative that you return to your primary care physician (or establish a relationship with a primary care physician if you do not have one) for your aftercare needs so that they can reassess your need for medications and monitor your lab values.  Discharge Instructions    Ambulatory referral to Neurology   Complete by:  As directed    An appointment is requested in approximately: 4-6 weeks   Ambulatory referral to Physical Therapy   Complete by:  As directed      Allergies as of 07/16/2017   No Known Allergies     Medication List    STOP taking these medications   MYRBETRIQ 25 MG Tb24 tablet Generic drug:  mirabegron ER     TAKE these medications   aspirin 81 MG tablet Take 81 mg by mouth daily.   atorvastatin 20 MG tablet Commonly known as:  LIPITOR Take 1 tablet (20 mg total) by mouth daily at 6 PM.   finasteride 5 MG tablet Commonly known as:  PROSCAR Take  5 mg by mouth daily.   FLOMAX 0.4 MG Caps capsule Generic drug:  tamsulosin Take 0.4 mg by mouth daily.   glipiZIDE 5 MG tablet Commonly known as:  GLUCOTROL Take 5 mg by mouth daily.   levothyroxine 75 MCG tablet Commonly known as:  SYNTHROID, LEVOTHROID Take 75 mcg by mouth daily.   losartan 25 MG tablet Commonly known as:  COZAAR Take 25 mg by mouth daily.   metFORMIN 750 MG 24 hr tablet Commonly known as:  GLUCOPHAGE-XR Take 750 mg by mouth daily.   ranitidine 300 MG tablet Commonly known as:  ZANTAC Take 1 tablet (300 mg total) by mouth at bedtime.      No Known Allergies Follow-up Information    Tat, Eustace Quail, DO. Schedule an appointment as soon as possible for a visit in 4 week(s).   Specialty:  Neurology Contact information: [place] [address] [phone]        Jani Gravel, MD Follow up.   Specialty:  Internal Medicine Contact information: [address] [place] [address] [phone]        [facility] Follow up.   Specialty:  Rehabilitation Why:  referral for out patient physical therapy made, office will call to set up appointment time Contact information: [address]  [place] [phone]           The results of significant diagnostics from this hospitalization (including imaging, microbiology, ancillary and laboratory) are listed below for reference.    Significant Diagnostic Studies: Ct Angio Head W Or Wo Contrast  Result Date: 07/15/2017 CLINICAL DATA:  Stroke. EXAM: CT ANGIOGRAPHY HEAD AND NECK TECHNIQUE: Multidetector CT imaging of the head and neck was performed using the standard protocol during bolus administration of intravenous contrast. Multiplanar CT image reconstructions and MIPs were obtained to evaluate the vascular anatomy. Carotid stenosis measurements (when applicable) are  obtained utilizing NASCET criteria, using the distal internal carotid diameter as the denominator. CONTRAST:  57mL ISOVUE-370 IOPAMIDOL (ISOVUE-370) INJECTION 76% COMPARISON:  Brain MRI from earlier today FINDINGS: CT HEAD FINDINGS Brain: Small left parietal cortex infarct is not detectable by CT. No evidence of interval infarct. The trace left occipital subdural hematoma is subtly seen and marked on series 4. No progression with maximal thickness of 2 mm. Cerebellar atrophy. Central volume loss with ventriculomegaly. Vascular: See below Skull: No acute finding Sinuses: Negative Orbits: Negative Review of the MIP images confirms the above findings CTA NECK FINDINGS Aortic arch: Atherosclerotic plaque.  Three vessel branching. Right carotid system: Mild calcified plaque at the common carotid bifurcation and ICA bulb. No flow limiting stenosis or ulceration. Left carotid system: Mild atherosclerotic plaque at the common carotid bifurcation. No stenosis or ulceration. Negative for dissection. Vertebral arteries: Proximal subclavian atherosclerosis without flow limiting stenosis. Moderate atheromatous narrowing of the left vertebral origin. Mild narrowing at the right vertebral origin. The left vertebral artery is dominant. No stenosis or dissection. 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 This was also described on admission cervical spine CT. Other neck: No incidental mass or adenopathy. Upper chest: Few tiny ground-glass densities in the right upper lobe. Review of the MIP images confirms the above findings CTA HEAD FINDINGS Anterior circulation: Atherosclerotic plaque on both carotid siphons with mild paraclinoid segment narrowing. No major branch occlusion or flow limiting stenosis. Negative for beading or aneurysm. Posterior circulation: Left vertebral artery dominance. Patent bilateral pica and superior cerebellar arteries. Symmetric patency of bilateral PCAs. No proximal flow  limiting stenosis or occlusion. Venous sinuses: Patent Anatomic variants: None significant Delayed phase: No abnormal intracranial enhancement. Review of the MIP images confirms the above findings IMPRESSION: 1. No emergent large vessel occlusion. 2. 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 3. Posterior circulation atherosclerosis with mild right and moderate left vertebral origin stenosis. 4. Known left parietal infarct by MRI is not detected by CT. No evidence of interval infarct. 5. Known trace left occipital subdural hematoma is very subtle by CT. No progressive hemorrhage. 6. 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 Electronically Signed   By: Monte Fantasia M.D.   On: 07/15/2017 19:56   Dg Shoulder Right  Result Date: 07/14/2017 CLINICAL DATA:  Recent fall with shoulder pain, initial encounter EXAM: RIGHT SHOULDER - 2+ VIEW COMPARISON:  None. FINDINGS: Degenerative changes of the acromioclavicular joint and glenohumeral articulation are noted. No acute fracture or dislocation is seen. The underlying bony thorax is within normal limits. IMPRESSION: Degenerative change without acute abnormality. Electronically Signed   By: Inez Catalina M.D.   On: 07/14/2017 13:10   Ct Head Wo Contrast  Result Date: 07/14/2017 CLINICAL DATA:  Fall this morning.  Maxillofacial trauma EXAM: CT HEAD WITHOUT CONTRAST CT CERVICAL SPINE WITHOUT CONTRAST TECHNIQUE:  Multidetector CT imaging of the head and cervical spine was performed following the standard protocol without intravenous contrast. Multiplanar CT image reconstructions of the cervical spine were also generated. COMPARISON:  Brain MRI 09/15/2016 FINDINGS: CT HEAD FINDINGS Brain: No evidence of acute infarction, hemorrhage, hydrocephalus, extra-axial collection or mass lesion/mass effect. Vascular: Atherosclerotic calcification.  No hyperdense vessel. Skull:  Negative for fracture. Reticulation of subcutaneous fat in the occipital scalp which could be contusion. Sinuses/Orbits: No evidence of injury. CT CERVICAL SPINE FINDINGS Alignment: Normal Skull base and vertebrae: Negative for fracture. There is bulky flowing osteophytes from C3 to T1 with ankylosis. Soft tissues and spinal canal: No prevertebral fluid or swelling. No visible canal hematoma. Bulky spurs encroach on the pharynx. Disc levels: C1-2, C2-3 and T1-2 is likely mobile. No visible cord impingement. Upper chest: Negative IMPRESSION: 1. No evidence of acute intracranial or cervical spine injury. 2. 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 The bulky spurs encroach on the pharynx. Electronically Signed   By: Monte Fantasia M.D.   On: 07/14/2017 13:34   Ct Angio Neck W Or Wo Contrast  Result Date: 07/15/2017 CLINICAL DATA:  Stroke. EXAM: CT ANGIOGRAPHY HEAD AND NECK TECHNIQUE: Multidetector CT imaging of the head and neck was performed using the standard protocol during bolus administration of intravenous contrast. Multiplanar CT image reconstructions and MIPs were obtained to evaluate the vascular anatomy. Carotid stenosis measurements (when applicable) are obtained utilizing NASCET criteria, using the distal internal carotid diameter as the denominator. CONTRAST:  48mL ISOVUE-370 IOPAMIDOL (ISOVUE-370) INJECTION 76% COMPARISON:  Brain MRI from earlier today FINDINGS: CT HEAD FINDINGS Brain: Small left parietal cortex infarct is not detectable by CT. No evidence of interval infarct. The trace left occipital subdural hematoma is subtly seen and marked on series 4. No progression with maximal thickness of 2 mm. Cerebellar atrophy. Central volume loss with ventriculomegaly. Vascular: See below Skull: No acute finding Sinuses: Negative Orbits: Negative Review of the MIP images confirms the above findings CTA NECK FINDINGS Aortic arch: Atherosclerotic plaque.  Three vessel branching. Right  carotid system: Mild calcified plaque at the common carotid bifurcation and ICA bulb. No flow limiting stenosis or ulceration. Left carotid system: Mild atherosclerotic plaque at the common carotid bifurcation. No stenosis or ulceration. Negative for dissection. Vertebral arteries: Proximal subclavian atherosclerosis without flow limiting stenosis. Moderate atheromatous narrowing of the left vertebral origin. Mild narrowing at the right vertebral origin. The left vertebral artery is dominant. No stenosis or dissection. 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 This was also described on admission cervical spine CT. Other neck: No incidental mass or adenopathy. Upper chest: Few tiny ground-glass densities in the right upper lobe. Review of the MIP images confirms the above findings CTA HEAD FINDINGS Anterior circulation: Atherosclerotic plaque on both carotid siphons with mild paraclinoid segment narrowing. No major branch occlusion or flow limiting stenosis. Negative for beading or aneurysm. Posterior circulation: Left vertebral artery dominance. Patent bilateral pica and superior cerebellar arteries. Symmetric patency of bilateral PCAs. No proximal flow limiting stenosis or occlusion. Venous sinuses: Patent Anatomic variants: None significant Delayed phase: No abnormal intracranial enhancement. Review of the MIP images confirms the above findings IMPRESSION: 1. No emergent large vessel occlusion. 2. 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 3. Posterior circulation atherosclerosis with mild right and moderate left vertebral origin stenosis. 4. Known left parietal infarct by MRI is not detected by CT. No evidence of interval infarct. 5. Known trace left occipital  subdural hematoma is very subtle by CT. No progressive hemorrhage. 6. 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  Electronically Signed   By: Monte Fantasia M.D.   On: 07/15/2017 19:56   Ct Cervical Spine Wo Contrast  Result Date: 07/14/2017 CLINICAL DATA:  Fall this morning.  Maxillofacial trauma EXAM: CT HEAD WITHOUT CONTRAST CT CERVICAL SPINE WITHOUT CONTRAST TECHNIQUE: Multidetector CT imaging of the head and cervical spine was performed following the standard protocol without intravenous contrast. Multiplanar CT image reconstructions of the cervical spine were also generated. COMPARISON:  Brain MRI 09/15/2016 FINDINGS: CT HEAD FINDINGS Brain: No evidence of acute infarction, hemorrhage, hydrocephalus, extra-axial collection or mass lesion/mass effect. Vascular: Atherosclerotic calcification.  No hyperdense vessel. Skull: Negative for fracture. Reticulation of subcutaneous fat in the occipital scalp which could be contusion. Sinuses/Orbits: No evidence of injury. CT CERVICAL SPINE FINDINGS Alignment: Normal Skull base and vertebrae: Negative for fracture. There is bulky flowing osteophytes from C3 to T1 with ankylosis. Soft tissues and spinal canal: No prevertebral fluid or swelling. No visible canal hematoma. Bulky spurs encroach on the pharynx. Disc levels: C1-2, C2-3 and T1-2 is likely mobile. No visible cord impingement. Upper chest: Negative IMPRESSION: 1. No evidence of acute intracranial or cervical spine injury. 2. 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 The bulky spurs encroach on the pharynx. Electronically Signed   By: Monte Fantasia M.D.   On: 07/14/2017 13:34   Mr Brain Wo Contrast  Result Date: 07/15/2017 CLINICAL DATA:  Syncope.  Unsteady gait. EXAM: MRI HEAD WITHOUT CONTRAST TECHNIQUE: Multiplanar, multiecho pulse sequences of the brain and surrounding structures were obtained without intravenous contrast. COMPARISON:  Brain MRI 09/15/2016 FINDINGS: Brain: 8 mm acute infarct in the left parietal cortex. Trace subdural hematoma along the posterior left cerebral convexity  measuring only 2 mm in thickness. See FLAIR and susceptibility weighted imaging. Minimal presumed chronic microvascular ischemic type changes. Ventriculomegaly attributed to central volume loss. Symmetric sulci diffusely. There are 2 foci of susceptibility in the right occipital lobe without associated edema, consistent with remote small hemorrhagic insults, nonspecific. Vascular: Major flow voids are preserved. Skull and upper cervical spine: Negative. No evidence of marrow lesion. Sinuses/Orbits: Negative Critical Value/emergent results were called by telephone at the time of interpretation on 07/15/2017 at 1:50 pm to Dr. Randa Spike , who verbally acknowledged these results. IMPRESSION: 1. 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 2. Trace subdural hematoma along the left occipital convexity measuring only 2 mm in thickness. 3. Central volume loss and ventriculomegaly. Electronically Signed   By: Monte Fantasia M.D.   On: 07/15/2017 13:56    Microbiology: No results found for this or any previous visit (from the past 240 hour(s)).   Labs: Basic Metabolic Panel: Recent Labs  Lab 07/14/17 1211 07/14/17 1535 07/16/17 0221  NA 139  --  141  K 3.9  --  3.5  CL 103  --  105  CO2 26  --  24  GLUCOSE 159*  --  160*  BUN 16  --  16  CREATININE 0.73 0.79 0.88  CALCIUM 8.9  --  9.0  MG  --   --  1.8  PHOS  --   --  3.1   Liver Function Tests: Recent Labs  Lab 07/14/17 1211 07/16/17 0221  AST 17 17  ALT 18 17  ALKPHOS 65 67  BILITOT 0.7 1.0  PROT 6.1* 6.4*  ALBUMIN 3.7 3.8   No results for input(s): LIPASE,  AMYLASE in the last 168 hours. No results for input(s): AMMONIA in the last 168 hours. CBC: Recent Labs  Lab 07/14/17 1211 07/14/17 1535 07/16/17 0221  WBC 10.5 7.8 11.1*  NEUTROABS 9.2*  --   --   HGB 14.7 13.1 14.4  HCT 42.6 38.5* 40.9  MCV 87.3 87.5 88.5  PLT 126* 124* 140*   Cardiac Enzymes: No results for input(s): CKTOTAL, CKMB, CKMBINDEX, TROPONINI in  the last 168 hours. BNP: BNP (last 3 results) No results for input(s): BNP in the last 8760 hours.  ProBNP (last 3 results) No results for input(s): PROBNP in the last 8760 hours.  CBG: Recent Labs  Lab 07/15/17 1700 07/15/17 2211 07/16/17 0421 07/16/17 0814 07/16/17 1222  GLUCAP 173* 131* 136* 169* 187*       Signed:  Kayleen Memos, MD [facility] 07/16/2017, 5:11 PM

## 2017-07-16 NOTE — Progress Notes (Signed)
Pt discharged to home. PIV removed, AVS reviewed. Per Dr. Nevada Crane, pt not to fill Plavix prescription, but resume aspirin instead, Dr. Nevada Crane spoke with pt and education reinforced. Pt to follow up outpatient for cardiac monitor and PT. Pt left unit via wheelchair, belongings in hand. Pt to be transported home by wife.

## 2017-07-16 NOTE — Progress Notes (Signed)
Txt paged Blount about pts mental status change this morning. Pt was walking to the restroom last night with minimal assist.  Pt is struggling to get to the bathroom and is very unsteady. Pt told me I am the enemy and that I have him hostage.  Earlier in the night he shared with me he was in the [employer].  Now he says I am the enemy.  Dr.Blount states that based upon Arora's not this isn't a change.  She stated that we need to continue to due neuro assessments on him and if his pupils changes or we notice other neuro changes to notify physician.

## 2017-07-16 NOTE — Discharge Instructions (Addendum)
Cardiac Event Monitoring °A cardiac event monitor is a small recording device that is used to detect abnormal heart rhythms (arrhythmias). The monitor is used to record your heart rhythm when you have symptoms, such as: °· Fast heartbeats (palpitations), such as heart racing or fluttering. °· Dizziness. °· Fainting or light-headedness. °· Unexplained weakness. ° °Some monitors are wired to electrodes placed on your chest. Electrodes are flat, sticky disks that attach to your skin. Other monitors may be hand-held or worn on the wrist. The monitor can be worn for up to 30 days. °If the monitor is attached to your chest, a technician will prepare your chest for the electrode placement and show you how to work the monitor. Take time to practice using the monitor before you leave the office. Make sure you understand how to send the information from the monitor to your health care provider. In some cases, you may need to use a landline telephone instead of a cell phone. °What are the risks? °Generally, this device is safe to use, but it possible that the skin under the electrodes will become irritated. °How to use your cardiac event monitor °· Wear your monitor at all times, except when you are in water: °? Do not let the monitor get wet. °? Take the monitor off when you bathe. Do not swim or use a hot tub with it on. °· Keep your skin clean. Do not put body lotion or moisturizer on your chest. °· Change the electrodes as told by your health care provider or any time they stop sticking to your skin. You may need to use medical tape to keep them on. °· Try to put the electrodes in slightly different places on your chest to help prevent skin irritation. They must remain in the area under your left breast and in the upper right section of your chest. °· Make sure the monitor is safely clipped to your clothing or in a location close to your body that your health care provider recommends. °· Press the button to record as soon  as you feel heart-related symptoms, such as: °? Dizziness. °? Weakness. °? Light-headedness. °? Palpitations. °? Thumping or pounding in your chest. °? Shortness of breath. °? Unexplained weakness. °· Keep a diary of your activities, such as walking, doing chores, and taking medicine. It is very important to note what you were doing when you pushed the button to record your symptoms. This will help your health care provider determine what might be contributing to your symptoms. °· Send the recorded information as recommended by your health care provider. It may take some time for your health care provider to process the results. °· Change the batteries as told by your health care provider. °· Keep electronic devices away from your monitor. This includes: °? Tablets. °? MP3 players. °? Cell phones. °· While wearing your monitor you should avoid: °? Electric blankets. °? Electric razors. °? Electric toothbrushes. °? Microwave ovens. °? Magnets. °? Metal detectors. °Get help right away if: °· You have chest pain. °· You have extreme difficulty breathing or shortness of breath. °· You develop a very fast heartbeat that persists. °· You develop dizziness that does not go away. °· You faint or constantly feel like you are about to faint. °Summary °· A cardiac event monitor is a small recording device that is used to help detect abnormal heart rhythms (arrhythmias). °· The monitor is used to record your heart rhythm when you have heart-related symptoms. °· Make   sure you understand how to send the information from the monitor to your health care provider. °· It is important to press the button on the monitor when you have any heart-related symptoms. °· Keep a diary of your activities, such as walking, doing chores, and taking medicine. It is very important to note what you were doing when you pushed the button to record your symptoms. This will help your health care provider learn what might be causing your symptoms. °This  information is not intended to replace advice given to you by your health care provider. Make sure you discuss any questions you have with your health care provider. °Document Released: 03/14/2008 Document Revised: 05/20/2016 Document Reviewed: 05/20/2016 °Elsevier Interactive Patient Education © 2017 [employer] ° °

## 2017-07-16 NOTE — Progress Notes (Addendum)
NEUROHOSPITALISTS STROKE TEAM - DAILY PROGRESS NOTE   ADMISSION HISTORY:  Justin Gomez is an 77 y.o. male with a past medical history of non-insulin-dependent diabetes mellitus, hypertension, dyslipidemia, hypothyroidism, postural hypotension and vertigo who presented to the from home with unsteady gait status post a fall and trauma to the back of his head.  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    Neurology was consulted for further evaluation.  During assessment, patient stated that he  had several years history of bouts of postural hypotension and vertigo which was treated with meclizine,  the last time he took meclizine was over a year ago, prior to taking a dose yesterday.  He has not had incidences of dizziness in the last several years, but this week he has had increased episodes  of dizziness and lightheadedmess with occasional associated nausea, and basilar headache without complaints of blurred vision.  Yesterday prior to admission he had many bouts of vertigo with dizziness and tried several times to get out of bed, but could not because of the persistence of the dizziness. When symptoms somewhat resolved he was able to get up went to the bathroom. In the bathroom, he felt very dizzy lightheaded and fell down in the bathroom hitting his head on the corner of the bathtub on the way down.  Per family he did not want to get up due to the severity of his dizziness.  EMS Was called and pt was brought to the hospital   On admission to the ER patient did not have any acute neurological symptoms except for word finding difficulties left facial droop which have been ongoing for several years now.   On assessment, patient had  a mask look on his face and upon further investigation, patient and family both admit to patient having incidences of hallucinating and acting out his dreams several years ago. On assessment,  with fine motor tremors at rest,   and more cogwheeling with movement on the right upper extremity than the left  LSN: yesterday tPA Given: No: outside of time windonw Premorbid modified Rankin scale (mRS): 0   SUBJECTIVE (INTERVAL HISTORY) Daughter is at the bedside. Patient is found laying in bed in NAD. Overall he feels his condition is gradually improving. Voices no new complaints. No new events reported overnight.   OBJECTIVE Lab Results: CBC:  Recent Labs  Lab 07/14/17 1211 07/14/17 1535 07/16/17 0221  WBC 10.5 7.8 11.1*  HGB 14.7 13.1 14.4  HCT 42.6 38.5* 40.9  MCV 87.3 87.5 88.5  PLT 126* 124* 140*   BMP: Recent Labs  Lab 07/14/17 1211 07/14/17 1535 07/16/17 0221  NA 139  --  141  K 3.9  --  3.5  CL 103  --  105  CO2 26  --  24  GLUCOSE 159*  --  160*  BUN 16  --  16  CREATININE 0.73 0.79 0.88  CALCIUM 8.9  --  9.0  MG  --   --  1.8  PHOS  --   --  3.1   Liver Function Tests:  Recent Labs  Lab 07/14/17 1211 07/16/17 0221  AST 17 17  ALT 18 17  ALKPHOS 65 67  BILITOT 0.7 1.0  PROT 6.1*  6.4*  ALBUMIN 3.7 3.8   Thyroid Function Studies:  Recent Labs    07/16/17 0221  TSH 3.832   Urinalysis:  Recent Labs  Lab 07/14/17 [place]  APPEARANCEUR CLEAR  LABSPEC 1.014  PHURINE 7.0  GLUCOSEU NEGATIVE  HGBUR NEGATIVE  BILIRUBINUR NEGATIVE  KETONESUR NEGATIVE  PROTEINUR NEGATIVE  NITRITE NEGATIVE  LEUKOCYTESUR NEGATIVE   PHYSICAL EXAM Temp:  [97.5 F (36.4 C)-98.6 F (37 C)] 97.8 F (36.6 C) (01/28 1225) Pulse Rate:  [68-90] 68 (01/28 1225) Resp:  [16-19] 17 (01/28 1225) BP: (110-187)/(43-83) 126/60 (01/28 1225) SpO2:  [94 %-99 %] 96 % (01/28 1225) Weight:  [81.2 kg (179 lb 0.2 oz)] 81.2 kg (179 lb 0.2 oz) (01/28 0500) General - Well  nourished, well developed, in no apparent distress HEENT-  Normocephalic,   Cardiovascular - Regular rate and rhythm  Respiratory - Lungs clear bilaterally. No wheezing. Abdomen - soft and non-tender, BS normal Extremities- no edema or cyanosis Mental Status: Alert, oriented, thought content appropriate.  Speech fluent without evidence of aphasia.  Able to follow 3 step commands without difficulty.  Patient with some word finding difficulties Cranial Nerves: II: Discs flat bilaterally; Visual fields grossly normal,  III,IV, VI: ptosis not present, extra-ocular motions intact bilaterally pupils equal, round, reactive to light and accommodation V,VII: Patient with mask face, mild left facial droop, smile symmetric, facial light touch sensation normal bilaterally VIII: Hearing intact to voice IX,X: uvula rises symmetrically XI: bilateral shoulder shrug XII: midline tongue extension Motor: Patient with  cogwheeling in right extremity greater than left. +bradykinesia on fingertaps Right : Upper extremity   5/5    Left:     Upper extremity   5/5  Lower extremity   5/5     Lower extremity   5/5 Tone and bulk:normal tone throughout; no atrophy noted Sensory: Pinprick and light touch intact throughout, bilaterally Deep Tendon Reflexes: 2+ and symmetric throughout Plantars: Right: downgoing   Left: downgoing Cerebellar: normal finger-to-nose, normal rapid alternating movements and normal heel-to-shin test Gait: not tested  IMAGING: I have personally reviewed the radiological images below and agree with the radiology interpretations. Ct Angio Head Lavetta Nielsen W Or Wo Contrast Result Date: 07/15/2017 IMPRESSION: 1. No emergent large vessel occlusion. 2. 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 3. Posterior circulation atherosclerosis with mild right and moderate left vertebral origin stenosis. 4. 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 No evidence of interval infarct. 5. Known trace left occipital subdural hematoma is very subtle by CT. No progressive hemorrhage. 6. 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 Electronically Signed   By: Monte Fantasia M.D.   On: 07/15/2017 19:56   Dg Shoulder Right 07/14/2017 IMPRESSION:  Degenerative change without acute abnormality.  Ct Head Wo Contrast 07/14/2017 IMPRESSION:  1. No evidence of acute intracranial or cervical spine injury.  2. 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 The bulky spurs encroach on the pharynx.   Ct Cervical Spine Wo Contrast 07/14/2017 IMPRESSION:  1. No evidence of acute intracranial or cervical spine injury.  2. 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 The bulky spurs encroach on the pharynx.   Mr Brain Wo Contrast 07/15/2017 IMPRESSION:  1. 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  2. Trace subdural hematoma along the left occipital convexity measuring only 2 mm in thickness. 3. Central volume loss and ventriculomegaly  Echocardiogram:  PENDING     IMPRESSION: Mr. CAPRICE WASKO is a 77 y.o. male with PMH of  non-insulin-dependent diabetes mellitus, hypertension, dyslipidemia, hypothyroidism, postural hypotension and vertigo who presented to the from home with unsteady gait status post a fall and trauma to the back of his head.  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    Suspected Etiology: athero vs cardioembolic source  Resultant Symptoms: Ataxia Stroke Risk  Factors: diabetes mellitus, hyperlipidemia and hypertension Other Stroke Risk Factors: Advanced age, Family hx stroke   Outstanding Stroke Work-up Studies:     Echocardiogram:                                                    PENDING  07/16/2017 ASSESSMENT:   Neuro exam unchanged. Imaging/labs and POC reviewed with patient and daughter. All questions answered. Patient to follow up with Dr Tat outpatient Neurology  PLAN  07/16/2017: Continue Plavix/ Statin Frequent neuro checks Telemetry monitoring PT/OT/SLP 30 Day cardiac event monitor at discharge - Cardiology aware Ongoing aggressive stroke risk factor management Patient counseled to be compliant with his antithrombotic medications Patient counseled on Lifestyle modifications including, Diet, Exercise, and Stress Follow up with Outpatient referral to movement disorder specialist neurologist Dr. Aquilla Solian at [place]: [place] SLP swallow evaluation Aspiration Precautions in progress Outpatient SLP therapy  R/O AFIB: 30 Day cardiac event monitor at discharge   HYPERTENSION: Stable SBP goal of < 180. DBP goal of < 105.  Long term BP goal normotensive. May slowly restart home B/P medications after 48 hours Home Meds: Cozaar  HYPERLIPIDEMIA:    Component Value Date/Time   CHOL 141 07/15/2017 1523   TRIG 116 07/15/2017 1523   HDL 30 (L) 07/15/2017 1523   CHOLHDL 4.7 07/15/2017 1523   VLDL 23 07/15/2017 1523   LDLCALC 88 07/15/2017 1523  Home Meds:  NONE LDL  goal < 70 Started on Lipitor to 20 mg daily Continue statin at discharge  DIABETES: Lab Results  Component Value Date   HGBA1C 6.0 (H) 07/15/2017   Recent Labs  Lab 07/15/17 1700 07/15/17 2211 07/16/17 0421 07/16/17 0814 07/16/17 1222  GLUCAP 173* 131* 136* 169* 187*  HgbA1c goal < 7.0 Currently on: Novolog Continue CBG monitoring and SSI to maintain glucose 140-180 mg/dl DM education   Other Active Problems: Principal  Problem:   Postural dizziness with near syncope Active Problems:   Essential hypertension   Type 2 diabetes mellitus without complication, without long-term current use of insulin (HCC)   Gastroesophageal reflux disease   Postural hypotension   Subdural hematoma [facility])    Hospital day # 1 VTE prophylaxis: SCD's  Diet : Diet Carb Modified Fluid consistency: Thin; Room service appropriate? Yes Fall precautions   FAMILY UPDATES:  family at bedside  TEAM UPDATES: Kayleen Memos, DO   Prior Home Stroke Medications:  aspirin 81 mg daily  Discharge Stroke Meds:  Please discharge patient on clopidogrel 75 mg daily  Disposition: Final discharge disposition not confirmed Therapy Recs:               PENDING Follow Up:  Jani Gravel, MD -PCP Follow up in 1-2 weeks      Assessment & plan discussed with with attending physician and they are in agreement.    Renie Ora Stroke Neurology Team 07/16/2017 1:47 PM  ATTENDING NOTE: I reviewed above note and agree with the assessment and plan. I have made any additions or clarifications directly to the above note. Pt was seen and examined.   77 year old male with history of diabetes, hypertension, hyperlipidemia, hypothyroidism, intermittent postural dizziness admitted for imbalance gait and dizziness after a fall.  He has been falling with me in neurology clinic at [facility] for foot drop and intermittent postural dizziness.  His postural dizziness was considered orthostatic hypotension at last visit.  This time he apparently to have another dizziness episode on standing up which caused a falling backward.  MRI showed left occipital trace SDH.  He was also found to have some parkinsonian features including bradykinesia, shuffling gait, and stooped posturing with mild masked face.  Orthostatic vital showed SBP decreased 20 mmHg on standing up.  However, no resting tremor, rigidity.  No downward gaze palsy or cerebellar symptoms.  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  Will refer to Dr. Carles Collet at [facility] neurology for further evaluation.  On this admission, patient also found to have a punctate left parietal cortical DWI restriction signal on MRI.  Could be punctate infarct or related to SDH.  LDL 88 and A1c 6.0.  CTA head and neck negative.  EF 60-65%.  Continued on baby aspirin and Lipitor for stroke prevention.  Recommend 30-day cardio event monitoring to rule out A. fib.  PT OT recommend outpatient PT/OT.  Neurology will sign off. Please call with questions. Pt will follow up with Dr. Carles Collet at [facility] Neurology in about 4-6 weeks. Thanks for the consult.   Rosalin Hawking, MD PhD Stroke Neurology 07/16/2017 4:54 PM     Neurology to sign-off once ECHO completed and negative findings. Please call with any further questions or concerns. Thank you for this consultation.  To contact Stroke Continuity provider, please refer to [email or web address] After hours, contact General Neurology

## 2017-07-16 NOTE — Progress Notes (Signed)
Physical Therapy Treatment Patient Details Name: Justin Gomez MRN: [id number] DOB: 06-01-1941 Today's Date: 07/16/2017    History of Present Illness 77 y.o. male admitted on 07/14/17 for fall at home with dizziness.  MRI revealed L parietal cortex infarct and L occipital convexity SDH.  Pt with significant PMH of lumbar herniated disc, HTN, DM, diverticulosis, L shoulder surgery, and R TKA.    PT Comments    Pt with improved steadiness with gait compared to last treatment. Per RN pt was confused over night and very unsteady however pt oriented, polite and functioning at supervision/min guard without AD. Pt and spouse con't to report that he is still weak and unsteady compared to baseline. Pt with poor standing posture demonstrating weak back extensors. Pt to benefit from outpt  To address mentioned deficits and return to baseline level of function. Acute PT to con't to follow.   Follow Up Recommendations  Outpatient PT;Supervision - Intermittent     Equipment Recommendations  None recommended by PT    Recommendations for Other Services       Precautions / Restrictions Precautions Precautions: Fall Restrictions Weight Bearing Restrictions: No    Mobility  Bed Mobility               General bed mobility comments: pt up in chair  Transfers Overall transfer level: Needs assistance Equipment used: None Transfers: Sit to/from Stand Sit to Stand: Supervision         General transfer comment: pt used arm rests to push up, increased time/guarded but no physical assist needed  Ambulation/Gait Ambulation/Gait assistance: Supervision;Min guard Ambulation Distance (Feet): 400 Feet Assistive device: None Gait Pattern/deviations: Step-through pattern;Shuffle;Staggering left;Staggering right Gait velocity: slow Gait velocity interpretation: Below normal speed for age/gender General Gait Details: pt with less staggering however pt wiht increased trunk flexion and short step  length and step height. pt with report "sometime when I walk my neck cramps up" emphasized importance of contracting abdomimal muscles and shld retraction to achieve back extension to decrease strain on posterior neck muscles. pt with no LOB   Stairs Stairs: Yes   Stair Management: One rail Right;Alternating pattern;Forwards Number of Stairs: 10 General stair comments: pt with incresaed pace with descent requiring min guard to prevent fall forward  Wheelchair Mobility    Modified Rankin (Stroke Patients Only) Modified Rankin (Stroke Patients Only) Pre-Morbid Rankin Score: No symptoms Modified Rankin: Moderately severe disability     Balance Overall balance assessment: Needs assistance Sitting-balance support: Feet supported;No upper extremity supported Sitting balance-Leahy Scale: Good     Standing balance support: No upper extremity supported Standing balance-Leahy Scale: Good                   Standardized Balance Assessment Standardized Balance Assessment : Dynamic Gait Index   Dynamic Gait Index Level Surface: Normal Change in Gait Speed: Normal Gait with Horizontal Head Turns: Normal Gait with Vertical Head Turns: Normal Gait and Pivot Turn: Mild Impairment Step Over Obstacle: Mild Impairment Step Around Obstacles: Normal Steps: Mild Impairment Total Score: 21      Cognition Arousal/Alertness: Awake/alert Behavior During Therapy: WFL for tasks assessed/performed Overall Cognitive Status: Within Functional Limits for tasks assessed                                        Exercises      General Comments  Pertinent Vitals/Pain Pain Assessment: No/denies pain    Home Living                      Prior Function            PT Goals (current goals can now be found in the care plan section) Progress towards PT goals: Progressing toward goals    Frequency    Min 3X/week      PT Plan Frequency needs to be  updated    Co-evaluation              AM-PAC PT "6 Clicks" Daily Activity  Outcome Measure  Difficulty turning over in bed (including adjusting bedclothes, sheets and blankets)?: None Difficulty moving from lying on back to sitting on the side of the bed? : None Difficulty sitting down on and standing up from a chair with arms (e.g., wheelchair, bedside commode, etc,.)?: None Help needed moving to and from a bed to chair (including a wheelchair)?: None Help needed walking in hospital room?: A Little Help needed climbing 3-5 steps with a railing? : A Little 6 Click Score: 22    End of Session Equipment Utilized During Treatment: Gait belt Activity Tolerance: Patient tolerated treatment well Patient left: in chair;with call bell/phone within reach;with chair alarm set Nurse Communication: Mobility status PT Visit Diagnosis: Unsteadiness on feet (R26.81)     Time: 2258-3462 PT Time Calculation (min) (ACUTE ONLY): 12 min  Charges:  $Gait Training: 8-22 mins                    G Codes:       Kittie Plater, PT, DPT Pager #: [phone] Office #: [phone]    [place] 07/16/2017, 9:52 AM

## 2017-07-16 NOTE — Progress Notes (Signed)
  Echocardiogram 2D Echocardiogram has been performed.  Justin Gomez Justin Gomez 07/16/2017, 1:54 PM

## 2017-07-16 NOTE — Progress Notes (Signed)
Patient ID: Justin Gomez, male   DOB: 02-14-1941, 77 y.o.   MRN: [id number] I reviewed Justin Gomez's head ct. The subdural is miniscule 1.25mm, this does not need imaging for followup. He is neurologically intact. He does not need follow up with me unless he develops neurological problems.

## 2017-07-17 NOTE — Consult Note (Signed)
           [facility] CM Primary Care Navigator  07/17/2017  Justin Gomez 14-Jul-1940 [id number]   Went to seepatient at the bedsideto identify possible discharge needs but he was alreadydischargedper staff report.  Per MD note, patient presented from home with an unsteady gait, fall and trauma to the back of his head.  Patient was discharged home yesterday with referral to Outpatient physical therapy to continue.  Primary care provider's officeis listed asprovidingtransition of care (TOC).  Patient has discharge instruction to follow-up with primary care provider and neurology.   For questions, please contact:  Dannielle Huh, BSN, RN- [facility] Primary Care Navigator  Telephone: [phone] [place]

## 2017-07-19 ENCOUNTER — Encounter: Payer: Self-pay | Admitting: Neurology

## 2017-07-20 ENCOUNTER — Ambulatory Visit: Payer: PPO | Attending: Internal Medicine | Admitting: Rehabilitation

## 2017-07-23 DIAGNOSIS — N400 Enlarged prostate without lower urinary tract symptoms: Secondary | ICD-10-CM | POA: Diagnosis not present

## 2017-07-23 DIAGNOSIS — S065X9A Traumatic subdural hemorrhage with loss of consciousness of unspecified duration, initial encounter: Secondary | ICD-10-CM | POA: Diagnosis not present

## 2017-07-23 DIAGNOSIS — R2681 Unsteadiness on feet: Secondary | ICD-10-CM | POA: Diagnosis not present

## 2017-07-23 DIAGNOSIS — I639 Cerebral infarction, unspecified: Secondary | ICD-10-CM | POA: Diagnosis not present

## 2017-07-23 DIAGNOSIS — E1165 Type 2 diabetes mellitus with hyperglycemia: Secondary | ICD-10-CM | POA: Diagnosis not present

## 2017-07-23 DIAGNOSIS — I10 Essential (primary) hypertension: Secondary | ICD-10-CM | POA: Diagnosis not present

## 2017-07-26 ENCOUNTER — Ambulatory Visit (INDEPENDENT_AMBULATORY_CARE_PROVIDER_SITE_OTHER): Payer: PPO

## 2017-07-26 DIAGNOSIS — R42 Dizziness and giddiness: Secondary | ICD-10-CM | POA: Diagnosis not present

## 2017-07-26 DIAGNOSIS — R270 Ataxia, unspecified: Secondary | ICD-10-CM | POA: Diagnosis not present

## 2017-07-26 DIAGNOSIS — R55 Syncope and collapse: Secondary | ICD-10-CM | POA: Diagnosis not present

## 2017-07-26 NOTE — Progress Notes (Signed)
Justin Gomez was seen today in the movement disorders clinic for neurologic consultation at the request of Rosalin Hawking, MD.  The consultation is for the evaluation of parkinsonism.  The records that were made available to me were reviewed.  This patient is accompanied in the office by his spouse who supplements the history.   Pt has been seen at [facility] since approximately 2017.  He presented at that time with foot drop.  That did gradually resolve, but the patient reports he has had a several year history of dizziness, previously treated with meclizine.  More recently, the patient was in the hospital after presenting with dizziness and unsteadiness in a week full of nausea.  He also had one episode of fall before hospitalization.  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  He was seen by neurology who was unsure if he really had a punctate infarct in the left parietal cortex, or if it was related to the subdural hematoma.  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  Patient had his cholesterol checked.  LDL was 88.  His hemoglobin A1c was 6.0.  He was initially taken off of his aspirin because of subdural hematoma, but at discharge it was recommended that he continue on aspirin and Lipitor.  An event monitor was also recommended as an outpatient to rule out atrial fibrillation.  While in the hospital, he was orthostatic.  It was felt that he was a bit parkinsonian and he was referred here for further evaluation for possible atypical state.  Specific Symptoms:  Tremor: no per patient; wife states that rarely he will when using hands Family hx of similar:  No. Voice: patient denies change; wife states that it is quieter Sleep: sleeps well  Vivid Dreams:  No.  Acting out dreams:  Yes.  , occasionally screams out Wet Pillows: Yes.   Postural symptoms:  Yes.    - overall good but some trouble in the AM  Falls?  Yes.   - 2 falls in the last year.  Most recent with little SDH.  In bathroom and fell and hit head against bathtub.  No LOC Bradykinesia symptoms: difficulty getting out of a chair - pt denies decreased stride length but wife thinks its little Loss of smell:  Yes.   - "gone for years" Loss of taste:  No. Urinary Incontinence:  Yes.   - taken off myrbetriq recently as told was causing HTN.  Since d/c from hospital, "I haven't had a bladder problem." Difficulty Swallowing:  Yes.   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   Trouble with ADL's:  No. per patient (wife states that he does have problems)  Trouble buttoning clothing: No. per patient (wife states that he does have problem) Depression:  No. - "normal as ever" Memory changes:  No. Hallucinations:  No. per patient (wife states that it happened a year ago)  visual distortions: No.  N/V:  No. Lightheaded:  Yes.  , some when first gets up  Syncope: No. Diplopia:  No. Dyskinesia:  No.    PREVIOUS MEDICATIONS: none to date  ALLERGIES:  No Known Allergies  CURRENT MEDICATIONS:  Outpatient Encounter Medications as of 07/31/2017  Medication Sig  . aspirin 81 MG tablet Take 81 mg by mouth daily.   Marland Kitchen  atorvastatin (LIPITOR) 20 MG tablet Take 1 tablet (20 mg total) by mouth daily at 6 PM.  . finasteride (PROSCAR) 5 MG tablet Take 5 mg by mouth daily.  Marland Kitchen glipiZIDE (GLUCOTROL) 5 MG tablet Take 5 mg by mouth daily.   Marland Kitchen levothyroxine (SYNTHROID, LEVOTHROID) 75 MCG tablet Take 75 mcg by mouth daily.  Marland Kitchen losartan (COZAAR) 25 MG tablet Take 25 mg by mouth daily.  . metFORMIN (GLUCOPHAGE-XR) 750 MG 24 hr tablet Take 750 mg by mouth daily.   . ranitidine (ZANTAC) 300 MG tablet Take 1 tablet (300 mg total) by mouth at bedtime.  . [DISCONTINUED] tamsulosin (FLOMAX) 0.4 MG CAPS capsule Take 0.4 mg by mouth  daily.   No facility-administered encounter medications on file as of 07/31/2017.     PAST MEDICAL HISTORY:   Past Medical History:  Diagnosis Date  . BPH (benign prostatic hypertrophy)   . Diabetes mellitus   . Diverticulosis   . Gastroesophageal reflux disease 04/13/2016  . Hiatal hernia   . Hyperlipidemia   . Hypertension   . Hypothyroidism   . Lumbar herniated disc    L4    PAST SURGICAL HISTORY:   Past Surgical History:  Procedure Laterality Date  . APPENDECTOMY  1956  . KNEE SURGERY  2009   right  . SHOULDER SURGERY  2010   left    SOCIAL HISTORY:   Social History   Socioeconomic History  . Marital status: Married    Spouse name: Not on file  . Number of children: 3  . Years of education: Not on file  . Highest education level: Not on file  Social Needs  . Financial resource strain: Not on file  . Food insecurity - worry: Not on file  . Food insecurity - inability: Not on file  . Transportation needs - medical: Not on file  . Transportation needs - non-medical: Not on file  Occupational History  . Occupation: business executive  Tobacco Use  . Smoking status: Never Smoker  . Smokeless tobacco: Never Used  Substance and Sexual Activity  . Alcohol use: Yes    Comment: Rarely  . Drug use: No  . Sexual activity: Not on file  Other Topics Concern  . Not on file  Social History Narrative  . Not on file    FAMILY HISTORY:   Family Status  Relation Name Status  . Father  Deceased  . Mother  Deceased  . Brother  Alive  . Sister x2 Alive  . Son  Alive  . Daughter x2 Alive  . Neg Hx  (Not Specified)    ROS:  A complete 10 system review of systems was obtained and was unremarkable apart from what is mentioned above.  PHYSICAL EXAMINATION:    VITALS:   Vitals:   07/31/17 0944  SpO2: 97%  Weight: 180 lb (81.6 kg)  Height: '5\' 11"'$  (1.803 m)    Orthostatic VS for the past 24 hrs (Last 3 readings):  BP- Lying Pulse- Lying BP- Sitting Pulse-  Sitting BP- Standing at 0 minutes Pulse- Standing at 0 minutes  07/31/17 0945 132/64 80 140/60 80 120/60 84     GEN:  The patient appears stated age and is in NAD. HEENT:  Normocephalic, atraumatic.  The mucous membranes are moist. The superficial temporal arteries are without ropiness or tenderness. CV:  RRR Lungs:  CTAB Neck/HEME:  There are no carotid bruits bilaterally.  Neurological examination:  Orientation: The patient is alert and oriented x3. Fund  of knowledge is appropriate.  Recent and remote memory are intact.  Attention and concentration are normal.    Able to name objects and repeat phrases. Cranial nerves: There is good facial symmetry. There is facial hypomimia with decreased blink and lips parted.  Pupils are equal round and reactive to light bilaterally. Fundoscopic exam is attempted but the disc margins are not well visualized bilaterally.  Extraocular muscles are intact. Has some trouble participating with VF testing but appear intact. The speech is fluent and clear. Soft palate rises symmetrically and there is no tongue deviation. Hearing is intact to conversational tone. Sensation: Sensation is intact to light and pinprick throughout (facial, trunk, extremities). Vibration is intact at the bilateral big toe. There is no extinction with double simultaneous stimulation. There is no sensory dermatomal level identified. Motor: Strength is 5/5 in the bilateral upper and lower extremities.   Shoulder shrug is equal and symmetric.  There is no pronator drift. Deep tendon reflexes: Deep tendon reflexes are 1/4 at the bilateral biceps, triceps, brachioradialis, patella and achilles. Plantar responses are downgoing bilaterally.  Movement examination: Tone: There is mild increased tone in the RUE.  Tone elsewhere is normal Abnormal movements: none Coordination:  There is decremation with RAM's, only with hand opening and closing and finger taps on the right.  All other RAMs are good  bilaterally Gait and Station: The patient has no difficulty arising out of a deep-seated chair without the use of the hands. The patient's stride length is initially good, but the more he walks the more he started to shuffle.  He has a stooped posture.  Arm swing was fair bilaterally.  The patient has a negative pull test.      ASSESSMENT/PLAN:  1.  Parkinsonism.  I suspect that this does represent idiopathic Parkinson's disease, akinetic rigid type.    -We discussed the diagnosis as well as pathophysiology of the disease.  We discussed treatment options as well as prognostic indicators.  Patient education was provided.  -We discussed that it used to be thought that levodopa would increase risk of melanoma but now it is believed that Parkinsons itself likely increases risk of melanoma. he is to get regular skin checks.  -Greater than 50% of the 60 minute visit was spent in counseling answering questions and talking about what to expect now as well as in the future.  We talked about medication options as well as potential future surgical options.  We talked about safety in the home.  -We decided to add carbidopa/levodopa 25/100.  1/2 tab tid x 1 wk, then 1/2 in am & noon & 1 at night for a week, then 1/2 in am &1 at noon &night for a week, then 1 po tid.  Risks, benefits, side effects and alternative therapies were discussed.  The opportunity to ask questions was given and they were answered to the best of my ability.  The patient expressed understanding and willingness to follow the outlined treatment protocols.  -the patient will continue with home PT  -We discussed community resources in the area including patient support groups and community exercise programs for PD and pt education was provided to the patient.  -He asked me about driving as they apparently told him in hospital no driving x 6 months.  I told the patient that he would need a driving evaluation should he wish to resume driving.   Discussed details of this.  -met with our PD social worker today  2.  Cerebral infarction  -  We discussed the diagnosis as well as pathophysiology of the disease.  We discussed signs and sx's of stroke and importance of calling 911 immediately should he have any of these.  Patient education was provided.    -Talked about stroke risk factors.  Talked about those risk factors which are modifiable.  -Talked about importance of blood pressure control with a goal <130/80 mm Hg.   -Talked about importance of lipid control and proper diet.  Lipids should be managed intensively, with a goal LDL < 70 mg/dL.  In the hospital, his was 37.  He was maintained on Lipitor.  -As above, it was unsure of whether the patient had a punctate cerebral infarct, or whether the area of restricted diffusion was more related to the subdural hematoma he had at the same time.  Regardless, it was ultimately treated as an ischemic event.  He was told to remain on his aspirin.  -Inpatient stroke did recommend an event monitor as an outpatient.  The patient has one on currently but they don't know who is to get results.  Told him to call his primary care for results to make sure that those are received.  Told him to make a follow-up appointment with stroke neurology.  -I counseled the patient on measures to reduce stroke risk, including the importance of medication compliance, risk factor control, exercise, healthy diet, and avoidance of smoking.    3.  I will see the patient back in the next few months, sooner should new neurologic issues arise.  Time recorded above did not include the 40 min of record review which was detailed above, which was non face to face time.    Cc:  Jani Gravel, MD

## 2017-07-30 ENCOUNTER — Ambulatory Visit: Payer: PPO | Admitting: Neurology

## 2017-07-30 DIAGNOSIS — I639 Cerebral infarction, unspecified: Secondary | ICD-10-CM | POA: Diagnosis not present

## 2017-07-30 DIAGNOSIS — E119 Type 2 diabetes mellitus without complications: Secondary | ICD-10-CM | POA: Diagnosis not present

## 2017-07-30 DIAGNOSIS — I10 Essential (primary) hypertension: Secondary | ICD-10-CM | POA: Diagnosis not present

## 2017-07-30 DIAGNOSIS — R2689 Other abnormalities of gait and mobility: Secondary | ICD-10-CM | POA: Diagnosis not present

## 2017-07-31 ENCOUNTER — Ambulatory Visit (INDEPENDENT_AMBULATORY_CARE_PROVIDER_SITE_OTHER): Payer: PPO | Admitting: Neurology

## 2017-07-31 ENCOUNTER — Encounter: Payer: Self-pay | Admitting: Neurology

## 2017-07-31 ENCOUNTER — Other Ambulatory Visit: Payer: Self-pay | Admitting: *Deleted

## 2017-07-31 ENCOUNTER — Encounter: Payer: Self-pay | Admitting: Psychology

## 2017-07-31 VITALS — Ht 71.0 in | Wt 180.0 lb

## 2017-07-31 DIAGNOSIS — G20 Parkinson's disease: Secondary | ICD-10-CM

## 2017-07-31 DIAGNOSIS — I6330 Cerebral infarction due to thrombosis of unspecified cerebral artery: Secondary | ICD-10-CM | POA: Diagnosis not present

## 2017-07-31 DIAGNOSIS — S065X9A Traumatic subdural hemorrhage with loss of consciousness of unspecified duration, initial encounter: Secondary | ICD-10-CM

## 2017-07-31 DIAGNOSIS — S065XAA Traumatic subdural hemorrhage with loss of consciousness status unknown, initial encounter: Secondary | ICD-10-CM

## 2017-07-31 DIAGNOSIS — G20A1 Parkinson's disease without dyskinesia, without mention of fluctuations: Secondary | ICD-10-CM

## 2017-07-31 HISTORY — DX: Parkinson's disease: G20

## 2017-07-31 HISTORY — DX: Parkinson's disease without dyskinesia, without mention of fluctuations: G20.A1

## 2017-07-31 MED ORDER — CARBIDOPA-LEVODOPA 25-100 MG PO TABS: 1.0000 | ORAL_TABLET | Freq: Three times a day (TID) | ORAL | 1 refills | Status: DC

## 2017-07-31 NOTE — Patient Instructions (Signed)
1. Start Carbidopa Levodopa as follows:  Take 1/2 tablet three times daily, at least 30 minutes before meals, for one week  Then take 1/2 tablet in the morning, 1/2 tablet in the afternoon, 1 tablet in the evening, at least 30 minutes before meals, for one week  Then take 1/2 tablet in the morning, 1 tablet in the afternoon, 1 tablet in the evening, at least 30 minutes before meals, for one week  Then take 1 tablet three times daily, at least 30 minutes before meals  

## 2017-07-31 NOTE — Progress Notes (Signed)
I met with the patient and his wife while they were in the clinic today.  We reviewed the newly diagnosed resources that are available in the [place] area.  The patient's wife is interested in the exercise programs.  The plan is that the patient will start taking his medications, continuing with physical therapy and transition into a plan of exercising at least 3 times a week either with a rock steady boxing or Parkinson's cycle.  In addition the patient is considering attending the  drumming classes.

## 2017-07-31 NOTE — Patient Outreach (Signed)
[facility]) Care Management  07/31/2017  Justin Gomez [birth year] [id number]   Outgoing telephone call to patient. HIPAA identifiers x's 2 verified with patient. Patient requested for RN CM to speak with his daughter. Patient's daughter requested for automated EMMI telephone calls to be stopped. She stated, patient was diagnosed with Parkinson's today. He has difficulty with answering questions and pressing buttons on his small phone. She stated, patient if someone wanted to reach out to patient or any other family members, to please to so. Patient's daughter stated, she attempted to answer the EMMI automated telephone calls and she had a difficult time answering the questions. She had to end the telephone call before completing the questions, per daughter. RN CM explained [facility] services and benefits to patient's daughter.  Plan: RM CM will send successful outreach letter to patient. RN CM will notify [facility] Case Management Assistant regarding case closure. RN CM will notify [facility] Case Management Assistant to remove patient from automated telephone calls.  Lake Bells, RN, BSN, MHA/MSL, [place] Telephonic Care Manager Coordinator [facility] Direct Phone: [phone] Cell Phone: [phone] Toll Free: [phone] Fax: [phone]

## 2017-08-02 ENCOUNTER — Encounter: Payer: Self-pay | Admitting: *Deleted

## 2017-08-02 NOTE — Telephone Encounter (Signed)
This encounter was created in error - please disregard.

## 2017-08-16 ENCOUNTER — Telehealth: Payer: Self-pay | Admitting: Neurology

## 2017-08-16 DIAGNOSIS — R42 Dizziness and giddiness: Secondary | ICD-10-CM | POA: Diagnosis not present

## 2017-08-16 NOTE — Telephone Encounter (Signed)
Pt's spouse called and said has been taking to much of his medication and now is not feeling good, please call and advise, I think it's the carbadopa

## 2017-08-16 NOTE — Telephone Encounter (Signed)
Spoke with patient's wife. She states he started Carbidopa Levodopa 1/2 tablet TID for a week, but then got confused and gave patient 1 tablet TID after. He has been on this dose for one week. She states he has had some nausea. I advised to try to take medication with toast or crackers if having some nausea. Advised to continue on 1 tablet TID since that was the ultimate goal and he has already been on medication for a week.   She states also having trouble moving. He is unable to get up on his own, having a hard time walking, has tremors in his hands now, and having urinary urgency. I advised these would not be symptoms from Carbidopa Levodopa but with the urinary frequency that he should be checked for UTI which could contribute to these other symptoms. She will contact their PCP to have this checked.   Dr. Carles Collet Juluis Rainier. Please advise if any other instructions.

## 2017-08-17 DIAGNOSIS — R32 Unspecified urinary incontinence: Secondary | ICD-10-CM | POA: Diagnosis not present

## 2017-08-17 DIAGNOSIS — R8279 Other abnormal findings on microbiological examination of urine: Secondary | ICD-10-CM | POA: Diagnosis not present

## 2017-08-21 ENCOUNTER — Telehealth: Payer: Self-pay | Admitting: Neurology

## 2017-08-21 NOTE — Telephone Encounter (Signed)
Patient's daughter Anderson Malta called and would like to speak with you regarding some things that are going on as well as getting clarity on his medications. Please Call. Thanks

## 2017-08-21 NOTE — Telephone Encounter (Signed)
Daughter called.  Patient did have UTI and is being treated. She had told patient he should stay off Levodopa until infection is cleared and then restart Titration.   Made her aware that is unnecessary. Patient has already been on Carbidopa Levodopa 1 TID for a week and no reason to start this titration over just because they went up faster than advised.   They will call with any other questions.

## 2017-08-22 DIAGNOSIS — Z7982 Long term (current) use of aspirin: Secondary | ICD-10-CM | POA: Diagnosis not present

## 2017-08-22 DIAGNOSIS — I69398 Other sequelae of cerebral infarction: Secondary | ICD-10-CM | POA: Diagnosis not present

## 2017-08-22 DIAGNOSIS — R269 Unspecified abnormalities of gait and mobility: Secondary | ICD-10-CM | POA: Diagnosis not present

## 2017-08-22 DIAGNOSIS — Z7984 Long term (current) use of oral hypoglycemic drugs: Secondary | ICD-10-CM | POA: Diagnosis not present

## 2017-08-22 DIAGNOSIS — E119 Type 2 diabetes mellitus without complications: Secondary | ICD-10-CM | POA: Diagnosis not present

## 2017-08-22 DIAGNOSIS — I10 Essential (primary) hypertension: Secondary | ICD-10-CM | POA: Diagnosis not present

## 2017-09-04 DIAGNOSIS — N3941 Urge incontinence: Secondary | ICD-10-CM | POA: Diagnosis not present

## 2017-09-11 ENCOUNTER — Other Ambulatory Visit: Payer: Self-pay | Admitting: Allergy and Immunology

## 2017-09-11 NOTE — Telephone Encounter (Signed)
Courtesy refill  

## 2017-09-17 ENCOUNTER — Telehealth: Payer: Self-pay | Admitting: Neurology

## 2017-09-17 NOTE — Telephone Encounter (Signed)
-----   Message from Allyn, DO sent at 09/17/2017  7:23 AM EDT ----- Let pt know that event monitor looked good

## 2017-09-17 NOTE — Telephone Encounter (Signed)
Patient made aware of results.  

## 2017-10-17 DIAGNOSIS — E039 Hypothyroidism, unspecified: Secondary | ICD-10-CM | POA: Diagnosis not present

## 2017-10-17 DIAGNOSIS — E1142 Type 2 diabetes mellitus with diabetic polyneuropathy: Secondary | ICD-10-CM | POA: Diagnosis not present

## 2017-10-22 DIAGNOSIS — E039 Hypothyroidism, unspecified: Secondary | ICD-10-CM | POA: Diagnosis not present

## 2017-10-22 DIAGNOSIS — R05 Cough: Secondary | ICD-10-CM | POA: Diagnosis not present

## 2017-10-22 DIAGNOSIS — E1165 Type 2 diabetes mellitus with hyperglycemia: Secondary | ICD-10-CM | POA: Diagnosis not present

## 2017-10-22 DIAGNOSIS — K219 Gastro-esophageal reflux disease without esophagitis: Secondary | ICD-10-CM | POA: Diagnosis not present

## 2017-10-22 DIAGNOSIS — I10 Essential (primary) hypertension: Secondary | ICD-10-CM | POA: Diagnosis not present

## 2017-11-09 NOTE — Progress Notes (Signed)
Justin Gomez was seen today in the movement disorders clinic for neurologic consultation at the request of Jani Gravel, MD.  The consultation is for the evaluation of parkinsonism.  The records that were made available to me were reviewed.  This patient is accompanied in the office by his spouse who supplements the history.   Pt has been seen at [facility] since approximately 2017.  He presented at that time with foot drop.  That did gradually resolve, but the patient reports he has had a several year history of dizziness, previously treated with meclizine.  More recently, the patient was in the hospital after presenting with dizziness and unsteadiness in a week full of nausea.  He also had one episode of fall before hospitalization.  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  He was seen by neurology who was unsure if he really had a punctate infarct in the left parietal cortex, or if it was related to the subdural hematoma.  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  Patient had his cholesterol checked.  LDL was 88.  His hemoglobin A1c was 6.0.  He was initially taken off of his aspirin because of subdural hematoma, but at discharge it was recommended that he continue on aspirin and Lipitor.  An event monitor was also recommended as an outpatient to rule out atrial fibrillation.  While in the hospital, he was orthostatic.  It was felt that he was a bit parkinsonian and he was referred here for further evaluation for possible atypical state.  Specific Symptoms:  Tremor: no per patient; wife states that rarely he will when using hands Family hx of similar:  No. Voice: patient denies change; wife states that it is quieter Sleep: sleeps well  Vivid Dreams:  No.  Acting out dreams:  Yes.  , occasionally screams out Wet Pillows: Yes.   Postural symptoms:  Yes.    - overall good but some trouble in the AM  Falls?  Yes.   - 2 falls in the last year.  Most recent with little SDH.  In bathroom and fell and hit head against bathtub.  No LOC Bradykinesia symptoms: difficulty getting out of a chair - pt denies decreased stride length but wife thinks its little Loss of smell:  Yes.   - "gone for years" Loss of taste:  No. Urinary Incontinence:  Yes.   - taken off myrbetriq recently as told was causing HTN.  Since d/c from hospital, "I haven't had a bladder problem." Difficulty Swallowing:  Yes.   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   Trouble with ADL's:  No. per patient (wife states that he does have problems)  Trouble buttoning clothing: No. per patient (wife states that he does have problem) Depression:  No. - "normal as ever" Memory changes:  No. Hallucinations:  No. per patient (wife states that it happened a year ago)  visual distortions: No.  N/V:  No. Lightheaded:  Yes.  , some when first gets up  Syncope: No. Diplopia:  No. Dyskinesia:  No.  11/13/17 update: Patient is seen today in follow-up.  This patient is accompanied in the office by his spouse who supplements the history. The patient was started on levodopa last visit.  He reports that "I don't think that it did  anything."  Wife thinks it has helped.   Pt denies falls.  Pt denies lightheadedness, near syncope.  No hallucinations.  Mood has been good.  In [facility] class [facility] [employer].  Also does PT exercises on a daily basis.  Asks about driving.  Still on lipitor.    PREVIOUS MEDICATIONS: none to date  ALLERGIES:  No Known Allergies  CURRENT MEDICATIONS:  Outpatient Encounter Medications as of 11/13/2017  Medication Sig  . aspirin 81 MG tablet Take 81 mg by mouth daily.   Marland Kitchen atorvastatin (LIPITOR) 20 MG tablet Take 1 tablet (20 mg total) by mouth daily at 6 PM.  . carbidopa-levodopa (SINEMET IR)  25-100 MG tablet Take 1 tablet by mouth 3 (three) times daily.  . finasteride (PROSCAR) 5 MG tablet Take 5 mg by mouth daily.  Marland Kitchen glipiZIDE (GLUCOTROL) 5 MG tablet Take 5 mg by mouth daily.   Marland Kitchen levothyroxine (SYNTHROID, LEVOTHROID) 75 MCG tablet Take 75 mcg by mouth daily.  Marland Kitchen losartan (COZAAR) 25 MG tablet Take 25 mg by mouth daily.  . metFORMIN (GLUCOPHAGE-XR) 750 MG 24 hr tablet Take 750 mg by mouth daily.   . [DISCONTINUED] ranitidine (ZANTAC) 300 MG tablet TAKE 1 TABLET BY MOUTH AT BEDTIME   No facility-administered encounter medications on file as of 11/13/2017.     PAST MEDICAL HISTORY:   Past Medical History:  Diagnosis Date  . BPH (benign prostatic hypertrophy)   . Diabetes mellitus   . Diverticulosis   . Gastroesophageal reflux disease 04/13/2016  . Hiatal hernia   . Hyperlipidemia   . Hypertension   . Hypothyroidism   . Lumbar herniated disc    L4    PAST SURGICAL HISTORY:   Past Surgical History:  Procedure Laterality Date  . APPENDECTOMY  1956  . KNEE SURGERY  2009   right  . SHOULDER SURGERY  2010   left    SOCIAL HISTORY:   Social History   Socioeconomic History  . Marital status: Married    Spouse name: Not on file  . Number of children: 3  . Years of education: Not on file  . Highest education level: Not on file  Occupational History  . Occupation: business Audiological scientist Needs  . Financial resource strain: Not on file  . Food insecurity:    Worry: Not on file    Inability: Not on file  . Transportation needs:    Medical: Not on file    Non-medical: Not on file  Tobacco Use  . Smoking status: Never Smoker  . Smokeless tobacco: Never Used  Substance and Sexual Activity  . Alcohol use: Yes    Comment: occasionally  . Drug use: No  . Sexual activity: Not on file  Lifestyle  . Physical activity:    Days per week: Not on file    Minutes per session: Not on file  . Stress: Not on file  Relationships  . Social connections:    Talks on  phone: Not on file    Gets together: Not on file    Attends religious service: Not on file    Active member of club or organization: Not on file    Attends meetings of clubs or organizations: Not on file    Relationship status: Not on file  . Intimate partner violence:    Fear of current or ex partner: Not on file    Emotionally abused: Not on file    Physically abused: Not on file  Forced sexual activity: Not on file  Other Topics Concern  . Not on file  Social History Narrative  . Not on file    FAMILY HISTORY:   Family Status  Relation Name Status  . Father  Deceased  . Mother  Deceased  . Brother  Alive  . Sister x2 Alive  . Son  Alive  . Daughter x2 Alive  . Neg Hx  (Not Specified)    ROS:  A complete 10 system review of systems was obtained and was unremarkable apart from what is mentioned above.  PHYSICAL EXAMINATION:    VITALS:   Vitals:   11/13/17 1439  BP: 124/66  Pulse: 72  SpO2: 97%  Weight: 176 lb (79.8 kg)  Height: 5\' 11"  (1.803 m)    No data found.   GEN:  The patient appears stated age and is in NAD. HEENT:  Normocephalic, atraumatic.  The mucous membranes are moist. The superficial temporal arteries are without ropiness or tenderness. CV:  RRR Lungs:  CTAB Neck/HEME:  There are no carotid bruits bilaterally.  Neurological examination:  Orientation: The patient is alert and oriented x3. Cranial nerves: There is good facial symmetry. The speech is fluent and clear. Soft palate rises symmetrically and there is no tongue deviation. Hearing is intact to conversational tone. Sensation: Sensation is intact to light touch throughout Motor: Strength is 5/5 in the bilateral upper and lower extremities.   Shoulder shrug is equal and symmetric.  There is no pronator drift.  Movement examination: Tone: There is normal tone in the UE and RLE.  Minimal increased tone in the LLE. Abnormal movements: none Coordination:  There is no decremation with  RAM's, with any form of RAMS, including alternating supination and pronation of the forearm, hand opening and closing, finger taps, heel taps and toe taps. Gait and Station: The patient has no difficulty arising out of a deep-seated chair without the use of the hands. The patient's stride length is good with forward flexion at the waist.    ASSESSMENT/PLAN:  1. AR Parkinsons disease  -We discussed the diagnosis as well as pathophysiology of the disease.  We discussed treatment options as well as prognostic indicators.  Patient education was provided.  -We discussed that it used to be thought that levodopa would increase risk of melanoma but now it is believed that Parkinsons itself likely increases risk of melanoma. he is to get regular skin checks.  -Greater than 50% of the 60 minute visit was spent in counseling answering questions and talking about what to expect now as well as in the future.  We talked about medication options as well as potential future surgical options.  We talked about safety in the home.  -continue carbidopa/levodopa 25/100 tid.  He does look better on the medication.  He has pretty mild disease at this point.  -keep exercising.  Congratulated him on [employer] exercise PD cycle classes he is attending.  -number given to the evaluator driving program.  If passes that, okay to resume driving.  Don't drive until has evaluation  -invited to PD symposium  2.  Cerebral infarction  -Talked about stroke risk factors.  Talked about those risk factors which are modifiable.  -Talked about importance of blood pressure control with a goal <130/80 mm Hg.   -Talked about importance of lipid control and proper diet.  Lipids should be managed intensively, with a goal LDL < 70 mg/dL.  In the hospital, his was 20.  He was maintained  on Lipitor.  Discussed importance of diet as well.  -As above, it was unsure of whether the patient had a punctate cerebral infarct, or whether the area of restricted  diffusion was more related to the subdural hematoma he had at the same time.  Regardless, it was ultimately treated as an ischemic event.  He was told to remain on his aspirin.  -Inpatient stroke did recommend an event monitor as an outpatient.  The patient has one on currently but they don't know who is to get results.  Told him to call his primary care for results to make sure that those are received.  Told him to make a follow-up appointment with stroke neurology.  -I counseled the patient on measures to reduce stroke risk, including the importance of medication compliance, risk factor control, exercise, healthy diet, and avoidance of smoking.    3.  Follow up is anticipated in the next few months, sooner should new neurologic issues arise.  Much greater than 50% of this visit was spent in counseling and coordinating care.  Total face to face time:  25 min    Cc:  Jani Gravel, MD

## 2017-11-13 ENCOUNTER — Ambulatory Visit (INDEPENDENT_AMBULATORY_CARE_PROVIDER_SITE_OTHER): Payer: PPO | Admitting: Neurology

## 2017-11-13 ENCOUNTER — Encounter: Payer: Self-pay | Admitting: Neurology

## 2017-11-13 VITALS — BP 124/66 | HR 72 | Ht 71.0 in | Wt 176.0 lb

## 2017-11-13 DIAGNOSIS — I6330 Cerebral infarction due to thrombosis of unspecified cerebral artery: Secondary | ICD-10-CM

## 2017-11-13 DIAGNOSIS — G20 Parkinson's disease: Secondary | ICD-10-CM | POA: Diagnosis not present

## 2017-11-13 NOTE — Patient Instructions (Addendum)
To schedule the driving assessment, you can contact [employer] in [place]. [phone]. Please ask them to send me the report so I can have the results.   Registration is OPEN!    Third Annual Parkinson's Education Symposium   To register: [email or web address]      Search:  [employer] person attending individually Questions: [place], [place] or Janett [email or web address]    [facility] for Parkinson's & Movement Disorders  The [place] Parkinson's and Movement Disorders team know that living well with a movement disorder extends far beyond our clinic walls. We are together with you. Our team is passionate about providing resources to you and your loved ones who are living with Parkinson's disease and movement disorders. Participate in these programs and join our community. These resources are free or low cost!   [facility] Movement [facility] is adding:   Innovative educational programs for patients and caregivers.   Support groups for patients and caregivers living with Parkinson's disease.   Parkinson's specific exercise programs.   Custom tailored therapeutic programs that will benefit patient's living with Parkinson's disease.   We are in this together. You can help and contribute to grow these programs and resources in our community. 100% of the funds donated to the [facility] stays right here in our community to support patients and their caregivers.  To make a tax deductible contribution:  -ask for a [facility] for Parkinson's envelope in the office today.  - call the Office of Institutional Advancement at [phone].

## 2017-11-21 ENCOUNTER — Other Ambulatory Visit: Payer: Self-pay | Admitting: Internal Medicine

## 2017-11-21 DIAGNOSIS — R053 Chronic cough: Secondary | ICD-10-CM

## 2017-11-21 DIAGNOSIS — R05 Cough: Secondary | ICD-10-CM

## 2017-11-23 DIAGNOSIS — R05 Cough: Secondary | ICD-10-CM | POA: Diagnosis not present

## 2017-11-23 DIAGNOSIS — E119 Type 2 diabetes mellitus without complications: Secondary | ICD-10-CM | POA: Diagnosis not present

## 2017-11-23 DIAGNOSIS — E039 Hypothyroidism, unspecified: Secondary | ICD-10-CM | POA: Diagnosis not present

## 2017-11-23 DIAGNOSIS — J069 Acute upper respiratory infection, unspecified: Secondary | ICD-10-CM | POA: Diagnosis not present

## 2017-11-23 DIAGNOSIS — I10 Essential (primary) hypertension: Secondary | ICD-10-CM | POA: Diagnosis not present

## 2017-12-03 ENCOUNTER — Ambulatory Visit
Admission: RE | Admit: 2017-12-03 | Discharge: 2017-12-03 | Disposition: A | Discharge status: Home / Self Care | Payer: PPO | Source: Ambulatory Visit | Attending: Internal Medicine | Admitting: Internal Medicine

## 2017-12-03 DIAGNOSIS — R911 Solitary pulmonary nodule: Secondary | ICD-10-CM | POA: Diagnosis not present

## 2017-12-03 DIAGNOSIS — R053 Chronic cough: Secondary | ICD-10-CM

## 2017-12-03 DIAGNOSIS — R05 Cough: Secondary | ICD-10-CM

## 2018-02-12 ENCOUNTER — Other Ambulatory Visit: Payer: Self-pay | Admitting: Neurology

## 2018-02-28 ENCOUNTER — Telehealth: Payer: Self-pay | Admitting: Neurology

## 2018-02-28 NOTE — Telephone Encounter (Signed)
Patient wants to know what he has to do to get to be able to drive again please call

## 2018-02-28 NOTE — Telephone Encounter (Signed)
Called to let patient know that per his last note he could resume driving if he passed a driving assessment. He was in exercise class and could not write down the number. If patient calls back please give him the information below:  If you are interested in the driving assessment, you can contact [employer] in [place]. [phone].

## 2018-04-10 DIAGNOSIS — Z125 Encounter for screening for malignant neoplasm of prostate: Secondary | ICD-10-CM | POA: Diagnosis not present

## 2018-04-10 DIAGNOSIS — I10 Essential (primary) hypertension: Secondary | ICD-10-CM | POA: Diagnosis not present

## 2018-04-10 DIAGNOSIS — E1165 Type 2 diabetes mellitus with hyperglycemia: Secondary | ICD-10-CM | POA: Diagnosis not present

## 2018-04-10 DIAGNOSIS — E039 Hypothyroidism, unspecified: Secondary | ICD-10-CM | POA: Diagnosis not present

## 2018-04-15 DIAGNOSIS — K4090 Unilateral inguinal hernia, without obstruction or gangrene, not specified as recurrent: Secondary | ICD-10-CM | POA: Diagnosis not present

## 2018-04-15 DIAGNOSIS — Z0000 Encounter for general adult medical examination without abnormal findings: Secondary | ICD-10-CM | POA: Diagnosis not present

## 2018-04-15 DIAGNOSIS — E1142 Type 2 diabetes mellitus with diabetic polyneuropathy: Secondary | ICD-10-CM | POA: Diagnosis not present

## 2018-04-15 DIAGNOSIS — I10 Essential (primary) hypertension: Secondary | ICD-10-CM | POA: Diagnosis not present

## 2018-04-15 DIAGNOSIS — E039 Hypothyroidism, unspecified: Secondary | ICD-10-CM | POA: Diagnosis not present

## 2018-04-15 DIAGNOSIS — E1165 Type 2 diabetes mellitus with hyperglycemia: Secondary | ICD-10-CM | POA: Diagnosis not present

## 2018-04-15 DIAGNOSIS — R05 Cough: Secondary | ICD-10-CM | POA: Diagnosis not present

## 2018-04-15 NOTE — Progress Notes (Signed)
Justin Gomez was seen today in the movement disorders clinic for neurologic consultation at the request of Jani Gravel, MD.  The consultation is for the evaluation of parkinsonism.  The records that were made available to me were reviewed.  This patient is accompanied in the office by his spouse who supplements the history.   Pt has been seen at [facility] since approximately 2017.  He presented at that time with foot drop.  That did gradually resolve, but the patient reports he has had a several year history of dizziness, previously treated with meclizine.  More recently, the patient was in the hospital after presenting with dizziness and unsteadiness in a week full of nausea.  He also had one episode of fall before hospitalization.  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  He was seen by neurology who was unsure if he really had a punctate infarct in the left parietal cortex, or if it was related to the subdural hematoma.  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  Patient had his cholesterol checked.  LDL was 88.  His hemoglobin A1c was 6.0.  He was initially taken off of his aspirin because of subdural hematoma, but at discharge it was recommended that he continue on aspirin and Lipitor.  An event monitor was also recommended as an outpatient to rule out atrial fibrillation.  While in the hospital, he was orthostatic.  It was felt that he was a bit parkinsonian and he was referred here for further evaluation for possible atypical state.  Specific Symptoms:  Tremor: no per patient; wife states that rarely he will when using hands Family hx of similar:  No. Voice: patient denies change; wife states that it is quieter Sleep: sleeps well  Vivid Dreams:  No.  Acting out dreams:  Yes.  , occasionally screams out Wet Pillows: Yes.   Postural symptoms:  Yes.    - overall good but some trouble in the AM  Falls?  Yes.   - 2 falls in the last year.  Most recent with little SDH.  In bathroom and fell and hit head against bathtub.  No LOC Bradykinesia symptoms: difficulty getting out of a chair - pt denies decreased stride length but wife thinks its little Loss of smell:  Yes.   - "gone for years" Loss of taste:  No. Urinary Incontinence:  Yes.   - taken off myrbetriq recently as told was causing HTN.  Since d/c from hospital, "I haven't had a bladder problem." Difficulty Swallowing:  Yes.   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   Trouble with ADL's:  No. per patient (wife states that he does have problems)  Trouble buttoning clothing: No. per patient (wife states that he does have problem) Depression:  No. - "normal as ever" Memory changes:  No. Hallucinations:  No. per patient (wife states that it happened a year ago)  visual distortions: No.  N/V:  No. Lightheaded:  Yes.  , some when first gets up  Syncope: No. Diplopia:  No. Dyskinesia:  No.  11/13/17 update: Patient is seen today in follow-up.  This patient is accompanied in the office by his spouse who supplements the history. The patient was started on levodopa last visit.  He reports that "I don't think that it did  anything."  Wife thinks it has helped.   Pt denies falls.  Pt denies lightheadedness, near syncope.  No hallucinations.  Mood has been good.  In [facility] cycle class at [employer].  Also does PT exercises on a daily basis.  Asks about driving.  Still on lipitor.    04/16/18 update:  Pt seen in f/u for PD.  This patient is accompanied in the office by his spouse who supplements the history.    He is on carbidopa/levodopa 25/100 tid.  He is engaged in PD cycle 2 days a week. Pt denies falls.  Pt denies lightheadedness, near syncope.  No hallucinations.  Mood has been good.  No new medical problems that  I am unaware of.  No diplopia  PREVIOUS MEDICATIONS: none to date  ALLERGIES:  No Known Allergies  CURRENT MEDICATIONS:  Outpatient Encounter Medications as of 04/16/2018  Medication Sig  . aspirin 81 MG tablet Take 81 mg by mouth daily.   Marland Kitchen atorvastatin (LIPITOR) 20 MG tablet Take 1 tablet (20 mg total) by mouth daily at 6 PM.  . carbidopa-levodopa (SINEMET IR) 25-100 MG tablet TAKE 1 TABLET BY MOUTH THREE TIMES DAILY  . finasteride (PROSCAR) 5 MG tablet Take 5 mg by mouth daily.  Marland Kitchen glipiZIDE (GLUCOTROL) 5 MG tablet Take 5 mg by mouth daily.   Marland Kitchen levothyroxine (SYNTHROID, LEVOTHROID) 75 MCG tablet Take 75 mcg by mouth daily.  Marland Kitchen losartan (COZAAR) 25 MG tablet Take 25 mg by mouth daily.  . metFORMIN (GLUCOPHAGE-XR) 750 MG 24 hr tablet Take 750 mg by mouth daily.    No facility-administered encounter medications on file as of 04/16/2018.     PAST MEDICAL HISTORY:   Past Medical History:  Diagnosis Date  . BPH (benign prostatic hypertrophy)   . Diabetes mellitus   . Diverticulosis   . Gastroesophageal reflux disease 04/13/2016  . Hiatal hernia   . Hyperlipidemia   . Hypertension   . Hypothyroidism   . Lumbar herniated disc    L4    PAST SURGICAL HISTORY:   Past Surgical History:  Procedure Laterality Date  . APPENDECTOMY  1956  . KNEE SURGERY  2009   right  . SHOULDER SURGERY  2010   left    SOCIAL HISTORY:   Social History   Socioeconomic History  . Marital status: Married    Spouse name: Not on file  . Number of children: 3  . Years of education: Not on file  . Highest education level: Not on file  Occupational History  . Occupation: business Audiological scientist Needs  . Financial resource strain: Not on file  . Food insecurity:    Worry: Not on file    Inability: Not on file  . Transportation needs:    Medical: Not on file    Non-medical: Not on file  Tobacco Use  . Smoking status: Never Smoker  . Smokeless tobacco: Never Used  Substance and Sexual  Activity  . Alcohol use: Yes    Comment: occasionally  . Drug use: No  . Sexual activity: Not on file  Lifestyle  . Physical activity:    Days per week: Not on file    Minutes per session: Not on file  . Stress: Not on file  Relationships  . Social connections:    Talks on phone: Not on file    Gets together: Not on file    Attends religious service: Not on file    Active member of club or organization:  Not on file    Attends meetings of clubs or organizations: Not on file    Relationship status: Not on file  . Intimate partner violence:    Fear of current or ex partner: Not on file    Emotionally abused: Not on file    Physically abused: Not on file    Forced sexual activity: Not on file  Other Topics Concern  . Not on file  Social History Narrative  . Not on file    FAMILY HISTORY:   Family Status  Relation Name Status  . Father  Deceased  . Mother  Deceased  . Brother  Alive  . Sister x2 Alive  . Son  Alive  . Daughter x2 Alive  . Neg Hx  (Not Specified)    ROS:  A complete 10 system review of systems was obtained and was unremarkable apart from what is mentioned above.  PHYSICAL EXAMINATION:    VITALS:   Vitals:   04/16/18 0948  BP: 138/64  Pulse: 68  SpO2: 98%  Weight: 178 lb (80.7 kg)  Height: 5\' 11"  (1.803 m)    No data found.  GEN:  The patient appears stated age and is in NAD. HEENT:  Normocephalic, atraumatic.  The mucous membranes are moist. The superficial temporal arteries are without ropiness or tenderness. CV:  RRR Lungs:  CTAB Neck/HEME:  There are no carotid bruits bilaterally.  Neurological examination:  Orientation:  Montreal Cognitive Assessment  04/16/2018  Visuospatial/ Executive (0/5) 2  Naming (0/3) 3  Attention: Read list of digits (0/2) 2  Attention: Read list of letters (0/1) 1  Attention: Serial 7 subtraction starting at 100 (0/3) 3  Language: Repeat phrase (0/2) 2  Language : Fluency (0/1) 1  Abstraction (0/2) 2    Delayed Recall (0/5) 0  Orientation (0/6) 6  Total 22  Adjusted Score (based on education) 22   Cranial nerves: There is good facial symmetry.  He has mild trouble with upgaze, but otherwise extraocular muscles are intact.  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  The speech is fluent and clear. Soft palate rises symmetrically and there is no tongue deviation. Hearing is intact to conversational tone. Sensation: Sensation is intact to light touch throughout Motor: Strength is 5/5 in the bilateral upper and lower extremities.   Shoulder shrug is equal and symmetric.  There is no pronator drift.  Movement examination: Tone: There is mild increased tone in the LUE  Abnormal movements: there is no tremor Coordination:  There is minimal decremation with RAM's, only with finger taps on the L Gait and Station: The patient has no difficulty arising out of a deep-seated chair without the use of the hands. The patient's stride length is normal.    ASSESSMENT/PLAN:  1. AR Parkinsons disease  -We discussed the diagnosis as well as pathophysiology of the disease.  We discussed treatment options as well as prognostic indicators.  Patient education was provided.  -We discussed that it used to be thought that levodopa would increase risk of melanoma but now it is believed that Parkinsons itself likely increases risk of melanoma. he is to get regular skin checks.  -continue carbidopa/levodopa 25/100 tid.  Talked about increasing his levodopa, but ultimately decided to hold on that.  He is mildly rigid, but he does not notice that.  -Patient asked again about driving.  We have talked about this multiple times.  Based on his [place] and on previous conversations, he needs a driving evaluation before he  can be approved for that.  He was given the information to the driving school to set up this evaluation.  -Talked about increasing his exercise.  Talked about exercise goals.   2.  Cerebral infarction  -Talked about  stroke risk factors.  Talked about those risk factors which are modifiable.  -Talked about importance of blood pressure control with a goal <130/80 mm Hg.   -Talked about importance of lipid control and proper diet.  Lipids should be managed intensively, with a goal LDL < 70 mg/dL.  In the hospital, his was 38.  He was maintained on Lipitor.  Discussed importance of diet as well.  -As above, it was unsure of whether the patient had a punctate cerebral infarct, or whether the area of restricted diffusion was more related to the subdural hematoma he had at the same time.  Regardless, it was ultimately treated as an ischemic event.  He was told to remain on his aspirin.  -Inpatient stroke did recommend an event monitor as an outpatient.  The patient has one on currently but they don't know who is to get results.  Told him to call his primary care for results to make sure that those are received.  Told him to make a follow-up appointment with stroke neurology.  -I counseled the patient on measures to reduce stroke risk, including the importance of medication compliance, risk factor control, exercise, healthy diet, and avoidance of smoking.    3. Follow up is anticipated in the next few months, sooner should new neurologic issues arise.  Much greater than 50% of this visit was spent in counseling and coordinating care.  Total face to face time:  25 min    Cc:  Jani Gravel, MD

## 2018-04-16 ENCOUNTER — Ambulatory Visit (INDEPENDENT_AMBULATORY_CARE_PROVIDER_SITE_OTHER): Payer: PPO | Admitting: Neurology

## 2018-04-16 ENCOUNTER — Encounter: Payer: Self-pay | Admitting: Neurology

## 2018-04-16 ENCOUNTER — Ambulatory Visit: Payer: PPO | Admitting: Neurology

## 2018-04-16 VITALS — BP 138/64 | HR 68 | Ht 71.0 in | Wt 178.0 lb

## 2018-04-16 DIAGNOSIS — G20 Parkinson's disease: Secondary | ICD-10-CM | POA: Diagnosis not present

## 2018-04-16 NOTE — Patient Instructions (Addendum)
Based on memory testing score and previous conversations, you should not drive until you pass a driving evaluation. If you are interested in the driving assessment, you can contact [employer] in [place]. [phone].   Community Parkinson's Exercise Programs   Parkinson's Wellness Recovery Exercise Programs:   PWR! Moves PD Exercise Class:  This is a therapist-led exercise class for people with Parkinson's disease in the [place] community. It consists of a one-hour exercise class each week. Classes are offered in eight-week sessions, and the cost per session is $80. Class size is limited to a maximum of 20 participants. Participant criteria includes: Participant must be able to get up and down from the floor with minimal to no assistance, have had 0-1 falls in the past 6 months, and have completed physical or occupational therapy at [facility] within the past year.  To find out more about session dates, questions, or to register, please contact Mady Haagensen, Physical Therapist, or Nita Sells, Physical Brewing technologist, at [facility] at [phone].  PWR! Circuit Class:  This is a therapist-led exercise class with intervals of circuit activities incorporating PWR! Moves into functional activities. It consists of one 45-minute exercise class per week. Classes are offered in eight-week sessions, and the cost per session is $120. Class size is limited to a maximum of eight participants to allow for hands-on instruction. Participant criteria: class is ideal for people with Parkinson's disease who have completed PWR! Moves Exercise Class or who are currently independently exercising and want to be challenged, must be able to walk independently with 0-1 falls in the past 6 months, able to get up and down from the floor independently, able to sit to stand independently, and able to jog 20 feet.   To find out more about  session dates, questions, or to register, please contact Mady Haagensen, Physical Therapist, or Nita Sells, Physical Brewing technologist, at [facility] at [phone].   [employer] Parkinson's Cycle:   Parkinson's Cycle Class at [facility] This is an ongoing class on Monday and Thursday mornings at 10:45 a.m. A healthcare provider referral is required to enroll. This class is FREE to participants, and you do not have to be a member of the [employer] to enroll. Contact Beth at [phone] or [email or web address]. Parkinson's Cycle Class at [facility] Ongoing Class Monday, Wednesday, and Friday mornings at 9:00 a.m. A healthcare provider referral is required to enroll. This class is FREE to participants, and you do not have to be a member of the [employer] to enroll. Contact Marlee at [phone] or [email or web address]. Parkinson's Cycle Class at [facility] Ongoing Class every Friday mornings at 12 p.m.  A healthcare provider referral is required to enroll. This class is FREE to participants, and you do not have to be a member of the [employer] to enroll. Contact [phone].  Parkinson's Cycle Class at [facility] Ongoing Class every Monday at 12pm.  A healthcare provider referral is required to enroll. This class is FREE to participants, and you do not have to be a member of the [employer] to enroll. Contact Almyra Free at [phone] or  [email or web address].   [employer]:  [employer]  Classes are offered Mondays at 5:15 p.m. and Tuesdays and Thursdays at 12 p.m. at [employer]. For more information, contact [phone] or visit [email or web address] or [email or web address]. [employer] [place] Classes are offered Monday, Wednesday, and Friday from 9:30 a.m. -  11:00 a.m. For more information, contact [phone] or [phone] or email  [email or web address] or visit [email or web address] or [email or web address]. [employer] (classes are offered at 2 locations) . [facility] [employer] in [place] (for more information, contact Brielle at [phone] or email [email or web address] . Cathren Laine at [facility] (class is open to the public -- for more information, contact Clabe Seal at [phone] or email [email or web address]) Sebastian are held at [facility] in [place], [place]. For more information, call Dr. Bing Plume at [phone] or [email or web address].   Personal Training for Parkinson's:   ACT Offers certified personal training to customize a program to meet your exercise needs to address Parkinson's disease. For more information, contact [phone] or visit [email or web address].  Community Dance for Parkinson's:   Community dance class for people with Parkinson's Disease Wednesdays at 9 a.m. [employer] [place], [place] [address] Please contact Eliberto Ivory [phone] for more information  Scholarships Available for Fitness Programs:  The [place] for [employer] is a non-profit 501(C)3 organization run by volunteers, whose mission is to strive to empower those living with Parkinson's Disease (PD), Progressive Supra-Nuclear Palsy (PSP) and Multiple System Atrophy ([place]).  Through financial support, recipients benefit from individual and group programs. ([phone] [email or web address] Based on memory testing score and prior conversations, If you want to resume driving you will need to have a driving evaluation completed. You can contact [employer] in [place]. [phone].

## 2018-04-17 DIAGNOSIS — K4090 Unilateral inguinal hernia, without obstruction or gangrene, not specified as recurrent: Secondary | ICD-10-CM | POA: Diagnosis not present

## 2018-04-17 DIAGNOSIS — I10 Essential (primary) hypertension: Secondary | ICD-10-CM | POA: Diagnosis not present

## 2018-04-17 DIAGNOSIS — E1142 Type 2 diabetes mellitus with diabetic polyneuropathy: Secondary | ICD-10-CM | POA: Diagnosis not present

## 2018-04-17 DIAGNOSIS — Z8673 Personal history of transient ischemic attack (TIA), and cerebral infarction without residual deficits: Secondary | ICD-10-CM | POA: Diagnosis not present

## 2018-04-17 DIAGNOSIS — E785 Hyperlipidemia, unspecified: Secondary | ICD-10-CM | POA: Diagnosis not present

## 2018-04-17 DIAGNOSIS — G20 Parkinson's disease: Secondary | ICD-10-CM | POA: Diagnosis not present

## 2018-04-19 ENCOUNTER — Telehealth: Payer: Self-pay | Admitting: Neurology

## 2018-04-19 NOTE — Telephone Encounter (Signed)
Paperwork for [employer] completed as requested from patient. Copy made for chart to be sent to scan and original mailed to patient.

## 2018-04-24 DIAGNOSIS — R3915 Urgency of urination: Secondary | ICD-10-CM | POA: Diagnosis not present

## 2018-04-24 DIAGNOSIS — N3941 Urge incontinence: Secondary | ICD-10-CM | POA: Diagnosis not present

## 2018-04-24 DIAGNOSIS — R350 Frequency of micturition: Secondary | ICD-10-CM | POA: Diagnosis not present

## 2018-04-25 ENCOUNTER — Telehealth: Payer: Self-pay | Admitting: Neurology

## 2018-04-25 NOTE — Telephone Encounter (Signed)
Patient called regarding his visit on 04/16/18 and paperwork that was left for Dr. Carles Collet to fill out. He was needing it to help alleviate student Loans. Please Call. Thanks

## 2018-04-25 NOTE — Telephone Encounter (Signed)
Patient made aware this was placed in the mail on 04/19/18 and should be received soon.

## 2018-05-29 DIAGNOSIS — R3915 Urgency of urination: Secondary | ICD-10-CM | POA: Diagnosis not present

## 2018-05-29 DIAGNOSIS — R351 Nocturia: Secondary | ICD-10-CM | POA: Diagnosis not present

## 2018-07-09 DIAGNOSIS — Z23 Encounter for immunization: Secondary | ICD-10-CM | POA: Diagnosis not present

## 2018-07-16 ENCOUNTER — Other Ambulatory Visit: Payer: Self-pay

## 2018-07-16 NOTE — Patient Outreach (Signed)
  [facility] [facility]) Care Management Chronic Special Needs Program  07/16/2018  Name: TREVIOUS RAMPEY DOB: January 22, 1941  MRN: [id number]  Mr. Marck Mcclenny is enrolled in a Chronic Special Needs Plan. RNCM called review Health Risk Assessment. No answer. HIPPA compliant message left.   Plan: Chronic care management coordinator will attempt outreach next week if no return call.   Thea Silversmith, RN, MSN, [place] [phone]

## 2018-07-19 ENCOUNTER — Other Ambulatory Visit: Payer: Self-pay

## 2018-07-19 NOTE — Patient Outreach (Signed)
  [place] [facility]) Care Management Chronic Special Needs Program  07/19/2018  Name: Justin Gomez DOB: Mar 21, 1941  MRN: [id number]  Justin Gomez is enrolled in a chronic special needs plan for Diabetes. Chronic Care Management Coordinator telephoned client to review health risk assessment and to develop individualized care plan.  Introduced the chronic care management program, importance of client participation, and taking their care plan to all provider appointments and inpatient facilities.  Reviewed the transition of care process and possible referral to community care management.  Subjective: Client reports he is doing well, states A1C is at goal less than 6.0. He reports he has not has any falls and denies any difficulty with ADL's. Denies any medication assistance needs. Client is agreeable to social work referral regarding advanced directives.  Goals Addressed            This Visit's Progress   . Client understands the importance of follow-up with providers by attending scheduled visits      . Client will not report change from baseline and no repeated symptoms of stroke with in the next 9 months       . Client will report no fall or injuries in the next 9 months.      . Client will use Assistive Devices as needed and verbalize understanding of device use      . Client will verbalize knowledge of self management of Hypertension as evidences by BP reading of 140/90 or less; or as defined by provider      . HEMOGLOBIN A1C < 7.0      . Maintain timely refills of diabetic medication as prescribed within the year .      Marland Kitchen Obtain annual  Lipid Profile, LDL-C      . Obtain Annual Eye (retinal)  Exam       . Obtain Annual Foot Exam      . Obtain annual screen for micro albuminuria (urine) , nephropathy (kidney problems)      . Obtain Hemoglobin A1C at least 2 times per year      . Visit Primary Care Provider or Endocrinologist at least 2 times per year          Plan:   Send successful outreach letter with a copy of their individualized care plan, Send individual care plan to provider and Send educational material  Chronic care management coordination will outreach in:  9 Months  Will refer client to:  Social Work   Thea Silversmith, RN, MSN, [place] [phone]

## 2018-07-22 ENCOUNTER — Ambulatory Visit: Payer: Self-pay

## 2018-07-23 ENCOUNTER — Other Ambulatory Visit: Payer: Self-pay

## 2018-07-23 NOTE — Patient Outreach (Signed)
[place] [facility]) Care Management  07/23/2018  Justin Gomez Nov 25, 1940 [id number]   Successful outreach to Justin Gomez regarding social work referral for assistance with Advance Directives. Justin Gomez was already knowledgeable about [place] POA and Living Will but did request packet and EMMI to be mailed. BSW will follow up within the next two weeks to ensure receipt and to review documentation with him if necessary.  Ronn Melena, BSW Social Worker [phone]

## 2018-08-06 ENCOUNTER — Other Ambulatory Visit: Payer: Self-pay

## 2018-08-06 NOTE — Patient Outreach (Signed)
[place] [facility]) Care Management  08/06/2018  Justin Gomez [birth year] [id number]   Successful outreach to [name] to ensure receipt of Advance Directive Packet and EMMI mailed on 07/23/18.  [name] did confirm receipt but said that he has not reviewed the documentation yet.  BSW is closing case at this time but did encourage him to call if any questions arise when he is able to review documentation.  Ronn Melena, BSW Social Worker [phone]

## 2018-08-07 ENCOUNTER — Other Ambulatory Visit: Payer: Self-pay | Admitting: Pharmacist

## 2018-08-07 NOTE — Patient Outreach (Signed)
[facility] [facility]) Care Management  [place] - Medication Adherence   08/07/2018  Justin Gomez [birth year] [id number]  [facility] Medications: glipizide 5 mg & metformin ER 750 mg Date & Supply of last refill: 07/23/2018, 30 day supply; 05/21/18, 90 day supply Current insurance:[employer]   Outreach:  Incoming call from [name] in response to the [facility] Medication Adherence Campaign. Speak with patient. HIPAA identifiers verified.  Subjective:  Justin Gomez reports that he takes both his metformin and glipizide twice daily as directed. Denies any missed doses or barriers to adherence. Counsel patient on the importance of adherence to these medications and control of blood sugar. Patient reports that his blood sugars have been good. Reports that his blood sugar was 84 mg/dL first thing this morning. Note that per chart, patient is enrolled in a chronic special needs plan for diabetes and currently working with [facility].  Note that patient is currently receiving a 90 day supply of his metformin, but only a 30 day supply of the glipizide. Patient expresses interest in getting a 90 day supply of this glipizide, as well as his atorvastatin, losartan and any other maintenance medications.   Contact patient's pharmacy to request that patient receive 90 day supplies of his glipizide and other maintenance medications to aid the patient with adherence. [place] technician reports that the prescriptions are currently written to allow for a 90 day supply. States that it is currently too soon to refill these prescriptions, but requests that patient call back in 1 week to request 90 day supply refills for this to be completed.  Let patient know and he states that he will call back to the pharmacy next week to request these 90 day supplies.  Patient denies any further medication questions/concerns at this time.    Objective: Lab Results  Component Value  Date   CREATININE 0.88 07/16/2017   CREATININE 0.79 07/14/2017   CREATININE 0.73 07/14/2017    Lab Results  Component Value Date   HGBA1C 6.0 (H) 07/15/2017    Lipid Panel     Component Value Date/Time   CHOL 141 07/15/2017 1523   TRIG 116 07/15/2017 1523   HDL 30 (L) 07/15/2017 1523   CHOLHDL 4.7 07/15/2017 1523   VLDL 23 07/15/2017 1523   LDLCALC 88 07/15/2017 1523    BP Readings from Last 3 Encounters:  04/16/18 138/64  11/13/17 124/66  07/16/17 (!) 123/49    No Known Allergies   Assessment:  . No barriers identified . Patient interested in receiving a 90 day supply of his glipizide as well as his other maintenance medications. Per pharmacy, patient eligible for 90 day supplies with next refill.    Plan:  1) Patient to call to follow up with his [place] next week to request that his maintenance prescriptions that were last refilled for a 30 day supply be refilled with a 90 day supply.  2) Will close pharmacy episode at this time.  Harlow Asa, PharmD, [place] [place] [facility] [phone]

## 2018-08-13 ENCOUNTER — Other Ambulatory Visit: Payer: Self-pay | Admitting: Neurology

## 2018-09-09 ENCOUNTER — Telehealth: Payer: Self-pay | Admitting: Neurology

## 2018-09-09 NOTE — Telephone Encounter (Signed)
Spoke with patient and verified that they do have a video compatible device. Best number to reach them is [phone]. Email: [email or web address]

## 2018-09-18 NOTE — Progress Notes (Signed)
Virtual Visit via Video Note The purpose of this virtual visit is to provide medical care while limiting exposure to the novel coronavirus.    Consent was obtained for video visit:  Yes.   Answered questions that patient had about telehealth interaction:  Yes.   I discussed the limitations, risks, security and privacy concerns of performing an evaluation and management service by telemedicine. I also discussed with the patient that there may be a patient responsible charge related to this service. The patient expressed understanding and agreed to proceed.  Pt location: Home Physician Location: office Name of referring provider:  Jani Gravel, MD I connected with Justin Gomez at patients initiation/request on 09/19/2018 at 10:30 AM EDT by video enabled telemedicine application and verified that I am speaking with the correct person using two identifiers. Pt MRN:  [id number] Pt DOB:  [birth year] Video Participants:  Justin Gomez;  wife, who supplements the history.   History of Present Illness:  Patient is seen for his Parkinson's disease.  He is on carbidopa/levodopa 25/100, 1 tablet 3 times per day (7am/11am/3pm).  Pt tripped in mulch and hit his knees.  Pt denies lightheadedness, near syncope.  No hallucinations.  Mood has been good.  Patient also has a history of stroke.  He is on aspirin.  Last visit I told him to make an appointment with stroke neurology, at least for a one-time follow-up visit.  It does not appear that he did that.  He did have an event monitor and that looked unremarkable.  He was doing biking 2 days a week but that is on hold for now due to the pandemic.  He doesn't drive.  He does mention drooling and asks if that is related to Parkinson's disease.   Observations/Objective:   Vitals:   09/19/18 1025  BP: 113/60  Pulse: 80    GEN:  The patient appears stated age and is in NAD.  Neurological examination:  Orientation: The patient is alert and oriented x3.  Cranial nerves: There is good facial symmetry. There is facial hypomimia.  The speech is fluent and clear. Soft palate rises symmetrically and there is no tongue deviation. Hearing is intact to conversational tone. Motor: Strength is at least antigravity x 4.   Shoulder shrug is equal and symmetric.  There is no pronator drift.  Movement examination: Tone: unable Abnormal movements: No tremor is noted Coordination:  There is  decremation with RAM's, only with finger taps on the left Gait and Station: The patient has no difficulty arising out of a deep-seated chair without the use of the hands. The patient's stride length is normal, without evidence of shuffling.     Labs: I was able to get a copy of lab work from primary care.  Last lipids were done on April 10, 2018.  Total cholesterol was 90, with LDL only 39.  HDL was 41    Assessment and Plan:   1. AR Parkinsons disease             -We discussed the diagnosis as well as pathophysiology of the disease.  We discussed treatment options as well as prognostic indicators.  Patient education was provided.             -We discussed that it used to be thought that levodopa would increase risk of melanoma but now it is believed that Parkinsons itself likely increases risk of melanoma. he is to get regular skin checks.             -  continue carbidopa/levodopa 25/100 tid.  Talked about increasing his levodopa, but ultimately decided to hold on that.  He is mildly rigid, but he does not notice that.             -Patient is no longer driving because of memory change.  His wife is doing all the driving.  Congratulated him on that, although he is not particularly happy about it.             -Congratulated him on increasing exercise after our last visit, although he he is currently unable to do the biking classes because of the pandemic.  He and I talked about things that he could do within his home.  -Discussed in detail Covid-19 and risk factors for  this, including age and PD.  Discussed importance of social distancing.  Discussed importance of staying home at all times, as is feasible.  Discussed taking advantage of grocery store hours for the elderly.  Pt expressed understanding.   2.  Cerebral infarction             -Talked about stroke risk factors.  Talked about those risk factors which are modifiable.             -Talked about importance of blood pressure control with a goal <130/80 mm Hg.              -Talked about importance of lipid control and proper diet.  Lipids should be managed intensively, with a goal LDL < 70 mg/dL.  In the hospital, his was 32 and it is all the way down to 39 now.             -As above, it was unsure of whether the patient had a punctate cerebral infarct, or whether the area of restricted diffusion was more related to the subdural hematoma he had at the same time.  Regardless, it was ultimately treated as an ischemic event.  He was told to remain on his aspirin             -I counseled the patient on measures to reduce stroke risk, including the importance of medication compliance, risk factor control, exercise, healthy diet, and avoidance of smoking.    3.  Sialorrhea  -This is commonly associated with PD.  We talked about treatments.  The patient is not a candidate for oral anticholinergic therapy because of increased risk of confusion and falls.  We discussed Botox (type A and B) and 1% atropine drops.  We discusssed that candy like lemon drops can help by stimulating mm of the oropharynx to induce swallowing.  He does not think it is bad enough to require Botox right now.  He will try the sour candy.  Follow Up Instructions:    -I discussed the assessment and treatment plan with the patient. The patient was provided an opportunity to ask questions and all were answered. The patient agreed with the plan and demonstrated an understanding of the instructions.   The patient was advised to call back or seek an  in-person evaluation if the symptoms worsen or if the condition fails to improve as anticipated.    Total Time spent in visit with the patient was:  23 min, of which more than 50% of the time was spent in counseling and/or coordinating care on safety.   Pt understands and agrees with the plan of care outlined.     Alonza Bogus, DO

## 2018-09-19 ENCOUNTER — Encounter: Payer: Self-pay | Admitting: *Deleted

## 2018-09-19 ENCOUNTER — Encounter: Payer: Self-pay | Admitting: Neurology

## 2018-09-19 ENCOUNTER — Other Ambulatory Visit: Payer: Self-pay

## 2018-09-19 ENCOUNTER — Telehealth (INDEPENDENT_AMBULATORY_CARE_PROVIDER_SITE_OTHER): Payer: HMO | Admitting: Neurology

## 2018-09-19 DIAGNOSIS — K117 Disturbances of salivary secretion: Secondary | ICD-10-CM | POA: Diagnosis not present

## 2018-09-19 DIAGNOSIS — G20 Parkinson's disease: Secondary | ICD-10-CM

## 2018-09-20 ENCOUNTER — Ambulatory Visit: Payer: PPO | Admitting: Neurology

## 2018-10-09 DIAGNOSIS — E039 Hypothyroidism, unspecified: Secondary | ICD-10-CM | POA: Diagnosis not present

## 2018-10-09 DIAGNOSIS — D649 Anemia, unspecified: Secondary | ICD-10-CM | POA: Diagnosis not present

## 2018-10-09 DIAGNOSIS — E119 Type 2 diabetes mellitus without complications: Secondary | ICD-10-CM | POA: Diagnosis not present

## 2018-10-16 DIAGNOSIS — R05 Cough: Secondary | ICD-10-CM | POA: Diagnosis not present

## 2018-10-16 DIAGNOSIS — R2681 Unsteadiness on feet: Secondary | ICD-10-CM | POA: Diagnosis not present

## 2018-10-16 DIAGNOSIS — E1165 Type 2 diabetes mellitus with hyperglycemia: Secondary | ICD-10-CM | POA: Diagnosis not present

## 2018-10-16 DIAGNOSIS — I10 Essential (primary) hypertension: Secondary | ICD-10-CM | POA: Diagnosis not present

## 2018-10-16 DIAGNOSIS — E039 Hypothyroidism, unspecified: Secondary | ICD-10-CM | POA: Diagnosis not present

## 2018-10-16 DIAGNOSIS — K219 Gastro-esophageal reflux disease without esophagitis: Secondary | ICD-10-CM | POA: Diagnosis not present

## 2018-10-16 DIAGNOSIS — G20 Parkinson's disease: Secondary | ICD-10-CM | POA: Diagnosis not present

## 2018-10-16 DIAGNOSIS — M21371 Foot drop, right foot: Secondary | ICD-10-CM | POA: Diagnosis not present

## 2018-11-11 ENCOUNTER — Other Ambulatory Visit: Payer: Self-pay | Admitting: Neurology

## 2018-11-12 NOTE — Telephone Encounter (Signed)
Requested Prescriptions   Pending Prescriptions Disp Refills  . carbidopa-levodopa (SINEMET IR) 25-100 MG tablet [Pharmacy Med Name: Carbidopa-Levodopa 25-100 MG Oral Tablet] 270 tablet 0    Sig: TAKE 1 TABLET BY MOUTH THREE TIMES DAILY   Rx last filled:08/13/18 #270 0 REFILLS  Pt last seen:09/19/18  Follow up appt scheduled:02/13/19

## 2019-02-11 NOTE — Progress Notes (Signed)
Justin Gomez was seen today in follow up for Parkinsons disease.   Pt is currently on carbidopa/levodopa 25/100, 1 tablet 3 times per day.   Pt had one fall since last visit.  He was getting out of the car and tripped over the curb.  He didn't get hurt and was able to get back up.  Pt denies lightheadedness, near syncope.  No hallucinations.  Mood has been good.  He hasn't been exercising because he hasn't been able to get to the cycle class.  I spoke with the wife following the visit in the lobby, and she stated that his balance just has not been as good since [facility]   PREVIOUS MEDICATIONS: Sinemet  ALLERGIES:  No Known Allergies  CURRENT MEDICATIONS:  Outpatient Encounter Medications as of 02/13/2019  Medication Sig  . aspirin 81 MG tablet Take 81 mg by mouth daily.   Marland Kitchen atorvastatin (LIPITOR) 20 MG tablet Take 1 tablet (20 mg total) by mouth daily at 6 PM.  . carbidopa-levodopa (SINEMET IR) 25-100 MG tablet TAKE 1 TABLET BY MOUTH THREE TIMES DAILY (Patient taking differently: Take 1.5 tablets by mouth. )  . finasteride (PROSCAR) 5 MG tablet Take 5 mg by mouth daily.  Marland Kitchen glipiZIDE (GLUCOTROL) 5 MG tablet Take 5 mg by mouth 2 (two) times daily.   Marland Kitchen levothyroxine (SYNTHROID, LEVOTHROID) 75 MCG tablet Take 75 mcg by mouth daily.  Marland Kitchen losartan (COZAAR) 25 MG tablet Take 25 mg by mouth daily.  . metFORMIN (GLUCOPHAGE-XR) 750 MG 24 hr tablet Take 750 mg by mouth 2 (two) times daily.   . mirabegron ER (MYRBETRIQ) 50 MG TB24 tablet Take 50 mg by mouth daily.  . pantoprazole (PROTONIX) 40 MG tablet Take 20 mg by mouth daily.   No facility-administered encounter medications on file as of 02/13/2019.     PAST MEDICAL HISTORY:   Past Medical History:  Diagnosis Date  . BPH (benign prostatic hypertrophy)   . Diabetes mellitus   . Diverticulosis   . Gastroesophageal reflux disease 04/13/2016  . Hiatal hernia   . Hyperlipidemia   . Hypertension   .  Hypothyroidism   . Lumbar herniated disc    L4    PAST SURGICAL HISTORY:   Past Surgical History:  Procedure Laterality Date  . APPENDECTOMY  1956  . KNEE SURGERY  2009   right  . SHOULDER SURGERY  2010   left    SOCIAL HISTORY:   Social History   Socioeconomic History  . Marital status: Married    Spouse name: Not on file  . Number of children: 3  . Years of education: Not on file  . Highest education level: Bachelor's degree (e.g., BA, AB, BS)  Occupational History  . Occupation: business Audiological scientist Needs  . Financial resource strain: Not on file  . Food insecurity    Worry: Not on file    Inability: Not on file  . Transportation needs    Medical: Not on file    Non-medical: Not on file  Tobacco Use  . Smoking status: Never Smoker  . Smokeless tobacco: Never Used  Substance and Sexual Activity  . Alcohol use: Yes    Comment: occasionally  . Drug use: No  . Sexual activity: Not on file  Lifestyle  . Physical activity    Days per week: Not on file    Minutes per session: Not on file  . Stress:  Not on file  Relationships  . Social Herbalist on phone: Not on file    Gets together: Not on file    Attends religious service: Not on file    Active member of club or organization: Not on file    Attends meetings of clubs or organizations: Not on file    Relationship status: Not on file  . Intimate partner violence    Fear of current or ex partner: Not on file    Emotionally abused: Not on file    Physically abused: Not on file    Forced sexual activity: Not on file  Other Topics Concern  . Not on file  Social History Narrative  . Not on file    FAMILY HISTORY:   Family Status  Relation Name Status  . Father  Deceased  . Mother  Deceased  . Brother  Alive  . Sister x2 Alive  . Son  Alive  . Daughter x2 Alive  . Neg Hx  (Not Specified)    ROS:  Review of Systems  Constitutional: Negative.   HENT: Negative.   Eyes: Negative.    Respiratory: Negative.   Cardiovascular: Negative.   Gastrointestinal: Negative.   Genitourinary: Negative.   Musculoskeletal: Negative.   Skin: Negative.     PHYSICAL EXAMINATION:    VITALS:   Vitals:   02/13/19 1535  BP: 133/75  Pulse: 89  SpO2: 97%  Weight: 171 lb 3.2 oz (77.7 kg)  Height: 5\' 11"  (1.803 m)    GEN:  The patient appears stated age and is in NAD. HEENT:  Normocephalic, atraumatic.  The mucous membranes are moist. The superficial temporal arteries are without ropiness or tenderness. CV:  RRR Lungs:  CTAB Neck/HEME:  There are no carotid bruits bilaterally.  Neurological examination:  Orientation: The patient is alert and oriented x3. Cranial nerves: There is good facial symmetry with facial hypomimia. The speech is fluent and clear. Soft palate rises symmetrically and there is no tongue deviation. Hearing is intact to conversational tone. Sensation: Sensation is intact to light touch throughout Motor: Strength is at least antigravity x4.  Movement examination: Tone: There is mild increased tone in the left upper extremity. Abnormal movements: There is no tremor Coordination:  There is mild decremation with RAM's, with hand opening and closing and finger taps and toe taps bilaterally. Gait and Station: The patient has mild trouble rising out of the chair without the use of his hands.  He has some start hesitation.  He is flexed at the waist.  He is short stepped.  He is slightly more imbalanced than previous.   ASSESSMENT/PLAN:  1. AR Parkinsons disease,  -We discussed the diagnosis as well as pathophysiology of the disease. We discussed treatment options as well as prognostic indicators. Patient education was provided. -We discussed that it used to be thought that levodopa would increase risk of melanoma but now it is believed that Parkinsons itself likely increases risk of melanoma. heis to get regular skin checks.  -increase carbidopa/levodopa 25/100, 1.5 tablets tid.  Risks, benefits, side effects and alternative therapies were discussed.  The opportunity to ask questions was given and they were answered to the best of my ability.  The patient expressed understanding and willingness to follow the outlined treatment protocols. -Patient is no longer driving because of memory change.  His wife is doing all the driving.  Congratulated him on that, although he is not particularly happy about it. -Refer to the  neuro rehab center for physical therapy.  -Discussed ways to exercise during the pandemic.  -Asked me about various research on Parkinson's disease and discussed this today.   2. Cerebral infarction -Talked about stroke risk factors. Talked about those risk factors which are modifiable. -Talked about importance ofblood pressure control with a goal <130/80 mm Hg.  -Talked about importance of lipid control and proper diet. Lipids should be managed intensively, with a goal LDL <70 mg/dL. In the hospital, his was 78 and it is all the way down to 39 now. -As above, it was unsure of whether the patient had a punctate cerebral infarct, or whether the area of restricted diffusion was more related to the subdural hematoma he had at the same time. Regardless, it was ultimately treated as an ischemic event. He was told to remain on his aspirin -I counseled the patient on measures to reduce stroke risk, including the importance of medication compliance, risk factor control, exercise, healthy diet, and avoidance of smoking.   3.  Sialorrhea  -This is commonly associated with PD.  We talked about treatments.  The patient is not a candidate for oral anticholinergic therapy because of increased risk of confusion and falls.  We discussed Botox (type A and B) and 1% atropine drops.  We discusssed that candy like lemon  drops can help by stimulating mm of the oropharynx to induce swallowing.  Not that bothersome right now.    4.  Follow up is anticipated in the next 4-6 months, sooner should new neurologic issues arise.  Much greater than 50% of this visit was spent in counseling and coordinating care.  Total face to face time:  25 min  Cc:  Jani Gravel, MD

## 2019-02-13 ENCOUNTER — Other Ambulatory Visit: Payer: Self-pay

## 2019-02-13 ENCOUNTER — Encounter: Payer: Self-pay | Admitting: Neurology

## 2019-02-13 ENCOUNTER — Ambulatory Visit (INDEPENDENT_AMBULATORY_CARE_PROVIDER_SITE_OTHER): Payer: HMO | Admitting: Neurology

## 2019-02-13 VITALS — BP 133/75 | HR 89 | Ht 71.0 in | Wt 171.2 lb

## 2019-02-13 DIAGNOSIS — G20 Parkinson's disease: Secondary | ICD-10-CM

## 2019-02-13 NOTE — Patient Instructions (Addendum)
1.  Increase carbidopa/levodopa 25/100, 1.5 tablets three times per day.  Use a pill cutter to cut them 2.  You have been referred to [facility]hab for therapy. They will call you directly to schedule an appointment.  Please contact them at [phone] with any questions or delay in scheduling your appointment.

## 2019-03-20 ENCOUNTER — Other Ambulatory Visit: Payer: Self-pay

## 2019-03-20 NOTE — Patient Outreach (Signed)
  [facility]) Care Management Chronic Special Needs Program  03/20/2019  Name: Justin Gomez DOB: 12/01/1940  MRN: [id number]  [name] is enrolled in a Chronic Special Needs Plan. RNCM called to follow up and review individualized care plan.  No answer. HIPAA compliant message left.   Plan: Chronic care management coordinator will attempt outreach within 1-2 weeks.  Thea Silversmith, RN, MSN, [place] [phone]

## 2019-03-24 ENCOUNTER — Ambulatory Visit: Payer: Self-pay

## 2019-03-25 ENCOUNTER — Other Ambulatory Visit: Payer: Self-pay

## 2019-03-25 NOTE — Patient Outreach (Signed)
  [facility]) Care Management Chronic Special Needs Program  03/25/2019  Name: Justin Gomez DOB: 04-Sep-1940  MRN: [id number]  [name] is enrolled in a chronic special needs plan for Diabetes. Reviewed and updated care plan.  Subjective: Client reports he is doing well. He reports he is able to obtain his medications and has transportation to his provider visits. Client denies any needs at this time.  Goals Addressed            This Visit's Progress   . COMPLETED: Client understands the importance of follow-up with providers by attending scheduled visits       Voiced understand of importance of attending provider visits.    . COMPLETED: Client will not report change from baseline and no repeated symptoms of stroke with in the next 9 months        Denies any issues or change from baseline.    . COMPLETED: Client will report no fall or injuries in the next 9 months.       Denies any falls since last conversation.    . COMPLETED: Client will use Assistive Devices as needed and verbalize understanding of device use       Denies any difficulty with use of device/glucose meter.    . Client will verbalize knowledge of self management of Hypertension as evidences by BP reading of 140/90 or less; or as defined by provider   On track   . COMPLETED: HEMOGLOBIN A1C < 7       A1C less than 7.0    . COMPLETED: Maintain timely refills of diabetic medication as prescribed within the year .       Denies any issues with obtaining medications.    . COMPLETED: Obtain annual  Lipid Profile, LDL-C       Complete 10/09/2018    . Obtain Annual Eye (retinal)  Exam    On track   . Obtain Annual Foot Exam   On track   . Obtain annual screen for micro albuminuria (urine) , nephropathy (kidney problems)   On track   . Obtain Hemoglobin A1C at least 2 times per year   On track    Done 10/09/2018    . Visit Primary Care Provider or Endocrinologist at least 2 times per year     On track     [facility] reinforced 24 hour nurse advice line availability; encouraged client to contact health care concierge for benefits questions. RNCM encouraged client to call RNCM as needed.  Plan: Send care plan to client; send care plan to primary care. RNCM will schedule next telephonic follow up according to tier level.      Thea Silversmith, RN, MSN, [place] [phone]   .

## 2019-04-10 DIAGNOSIS — Z0000 Encounter for general adult medical examination without abnormal findings: Secondary | ICD-10-CM | POA: Diagnosis not present

## 2019-04-10 DIAGNOSIS — Z23 Encounter for immunization: Secondary | ICD-10-CM | POA: Diagnosis not present

## 2019-04-10 DIAGNOSIS — Z125 Encounter for screening for malignant neoplasm of prostate: Secondary | ICD-10-CM | POA: Diagnosis not present

## 2019-04-10 DIAGNOSIS — R739 Hyperglycemia, unspecified: Secondary | ICD-10-CM | POA: Diagnosis not present

## 2019-04-10 DIAGNOSIS — I10 Essential (primary) hypertension: Secondary | ICD-10-CM | POA: Diagnosis not present

## 2019-04-10 DIAGNOSIS — E785 Hyperlipidemia, unspecified: Secondary | ICD-10-CM | POA: Diagnosis not present

## 2019-04-10 DIAGNOSIS — E039 Hypothyroidism, unspecified: Secondary | ICD-10-CM | POA: Diagnosis not present

## 2019-04-13 ENCOUNTER — Other Ambulatory Visit: Payer: Self-pay | Admitting: Neurology

## 2019-04-15 ENCOUNTER — Other Ambulatory Visit: Payer: Self-pay

## 2019-04-15 ENCOUNTER — Telehealth: Payer: Self-pay | Admitting: Neurology

## 2019-04-15 MED ORDER — CARBIDOPA-LEVODOPA 25-100 MG PO TABS: 1.5000 | ORAL_TABLET | Freq: Three times a day (TID) | ORAL | 1 refills | Status: DC

## 2019-04-15 NOTE — Telephone Encounter (Signed)
Patient is calling in again about medication

## 2019-04-15 NOTE — Telephone Encounter (Signed)
Please let patient know to check at pharmacy shortly

## 2019-04-15 NOTE — Telephone Encounter (Signed)
Patient called and said he needs refills sent with an updated dosage on his carbidopa-levodopa 25-100 MG sent to the pharmacy below as soon as possible. He said the dosage was changed to 1 and a half pills 3 times a day but a new prescription wasn't sent to the pharmacy.  Patient reports he is out of the medication completely.  [facility] [address]

## 2019-04-16 ENCOUNTER — Telehealth: Payer: Self-pay | Admitting: Neurology

## 2019-04-16 MED ORDER — CARBIDOPA-LEVODOPA 25-100 MG PO TABS: 1.5000 | ORAL_TABLET | Freq: Three times a day (TID) | ORAL | 2 refills | Status: DC

## 2019-04-16 NOTE — Telephone Encounter (Signed)
Patient states that the [facility] [address] will not refill the Carbidopa levodopa due to [place] calling in the wrong amount for the dosage change. He wants a phone call back to him to let him know that the correct dosage has been called in. Please call he is out of medication,  Last seen on 02-13-19  Patient is upset that this as not been taken care of

## 2019-04-16 NOTE — Telephone Encounter (Signed)
Justin Gomez will contact pt to make him aware of this

## 2019-04-16 NOTE — Telephone Encounter (Signed)
Requested Prescriptions   Pending Prescriptions Disp Refills  . carbidopa-levodopa (SINEMET IR) 25-100 MG tablet 504 tablet 2    Sig: Take 1.5 tablets by mouth 3 (three) times daily.   [facility] Rx for qty #504 this should be enough. Per last office note patient was to increase Rx to 1.5 TID

## 2019-04-16 NOTE — Telephone Encounter (Signed)
Patient called regarding his Carbidopa Levodopa medication. He said that he has contacted the pharmacy and the pharmacy has contacted our office as well. He said he has been out of his medication for a couple days now. He is scheduled for 07/22/19.  Thank you

## 2019-04-16 NOTE — Telephone Encounter (Signed)
Called spoke with patient clarification on pharmacy was made. Rx was sent to right pharmacy.

## 2019-04-17 DIAGNOSIS — D649 Anemia, unspecified: Secondary | ICD-10-CM | POA: Diagnosis not present

## 2019-04-17 DIAGNOSIS — I10 Essential (primary) hypertension: Secondary | ICD-10-CM | POA: Diagnosis not present

## 2019-04-17 DIAGNOSIS — G20 Parkinson's disease: Secondary | ICD-10-CM | POA: Diagnosis not present

## 2019-04-17 DIAGNOSIS — E119 Type 2 diabetes mellitus without complications: Secondary | ICD-10-CM | POA: Diagnosis not present

## 2019-04-17 DIAGNOSIS — E039 Hypothyroidism, unspecified: Secondary | ICD-10-CM | POA: Diagnosis not present

## 2019-04-17 DIAGNOSIS — E785 Hyperlipidemia, unspecified: Secondary | ICD-10-CM | POA: Diagnosis not present

## 2019-04-17 DIAGNOSIS — Z0000 Encounter for general adult medical examination without abnormal findings: Secondary | ICD-10-CM | POA: Diagnosis not present

## 2019-05-02 ENCOUNTER — Telehealth: Payer: Self-pay | Admitting: Neurology

## 2019-05-02 DIAGNOSIS — G20 Parkinson's disease: Secondary | ICD-10-CM

## 2019-05-02 DIAGNOSIS — R1310 Dysphagia, unspecified: Secondary | ICD-10-CM

## 2019-05-02 NOTE — Telephone Encounter (Signed)
Done and patient is aware

## 2019-05-02 NOTE — Telephone Encounter (Signed)
Order.  That was long time ago

## 2019-05-02 NOTE — Telephone Encounter (Signed)
Order MBE if not previously done and pt agreeable with dx of dysphagia

## 2019-05-02 NOTE — Telephone Encounter (Signed)
One was done in 2018, prior to PD. Would you want one done still or cancel order?

## 2019-05-02 NOTE — Telephone Encounter (Signed)
The following message was left with [employer] on 05/02/19 at 12:20 PM.  Caller states question on chronic cough and has Parkinson's. States he has a cough for a long time and was told it was acid reflux. He has had the cough for a year. Cough is worse after he eats. No cough during sleep.  Care Advice Given per Guideline: See PCP within 2 weeks, referred to see PCP.

## 2019-05-05 NOTE — Telephone Encounter (Signed)
Sending to clinical staff for review: Okay to sign/close encounter or is further follow up needed? ° °

## 2019-05-08 ENCOUNTER — Telehealth: Payer: Self-pay | Admitting: Neurology

## 2019-05-08 DIAGNOSIS — G20 Parkinson's disease: Secondary | ICD-10-CM

## 2019-05-08 DIAGNOSIS — R1310 Dysphagia, unspecified: Secondary | ICD-10-CM

## 2019-05-08 DIAGNOSIS — K117 Disturbances of salivary secretion: Secondary | ICD-10-CM

## 2019-05-08 NOTE — Telephone Encounter (Signed)
Spoke with patient and discussed barium swallow.  Patient is agreeable and order placed.  Left message at [phone] to schedule appointment.

## 2019-05-08 NOTE — Telephone Encounter (Signed)
Patient spoke with someone last week about chronic cough and states someone was suppose to call him back this week and he has not heard anything please call

## 2019-05-12 ENCOUNTER — Other Ambulatory Visit (HOSPITAL_COMMUNITY): Payer: Self-pay | Admitting: *Deleted

## 2019-05-12 DIAGNOSIS — R1310 Dysphagia, unspecified: Secondary | ICD-10-CM

## 2019-05-27 ENCOUNTER — Encounter (HOSPITAL_COMMUNITY): Payer: HMO

## 2019-05-27 ENCOUNTER — Ambulatory Visit (HOSPITAL_COMMUNITY): Payer: HMO

## 2019-05-28 ENCOUNTER — Telehealth: Payer: Self-pay | Admitting: Neurology

## 2019-05-28 NOTE — Telephone Encounter (Signed)
I wonder if he is confusing guaifenecin and guanfacine?  In either case, they are different, neither of which is approved for PD by the FDA.  We can discuss more at next visit

## 2019-05-28 NOTE — Telephone Encounter (Signed)
Patient would like to discuss cough syrup with PD and it helping him. He is unsure the exact name. After his research it had a clinical trial and all that have a certain ingredient which is in Robutussin helps.   Canceled Barium Swallow  Please advise?

## 2019-05-28 NOTE — Telephone Encounter (Signed)
Patient need to speak to a nurse or Dr tat left message with the after hour service

## 2019-05-28 NOTE — Telephone Encounter (Signed)
Left message to call office back

## 2019-05-29 NOTE — Telephone Encounter (Signed)
Pt is aware, he canceled barium swallow and is going to call and reschedule he states.

## 2019-06-03 DIAGNOSIS — E119 Type 2 diabetes mellitus without complications: Secondary | ICD-10-CM | POA: Diagnosis not present

## 2019-06-17 ENCOUNTER — Other Ambulatory Visit (HOSPITAL_COMMUNITY): Payer: Self-pay | Admitting: *Deleted

## 2019-06-17 DIAGNOSIS — R1310 Dysphagia, unspecified: Secondary | ICD-10-CM

## 2019-06-26 ENCOUNTER — Other Ambulatory Visit: Payer: Self-pay

## 2019-06-26 ENCOUNTER — Ambulatory Visit (HOSPITAL_COMMUNITY)
Admission: RE | Admit: 2019-06-26 | Discharge: 2019-06-26 | Disposition: A | Discharge status: Home / Self Care | Payer: HMO | Source: Ambulatory Visit | Attending: Neurology | Admitting: Neurology

## 2019-06-26 DIAGNOSIS — R1310 Dysphagia, unspecified: Secondary | ICD-10-CM

## 2019-06-26 DIAGNOSIS — R05 Cough: Secondary | ICD-10-CM | POA: Diagnosis not present

## 2019-06-26 DIAGNOSIS — G20 Parkinson's disease: Secondary | ICD-10-CM

## 2019-07-03 ENCOUNTER — Telehealth: Payer: Self-pay | Admitting: Neurology

## 2019-07-03 ENCOUNTER — Ambulatory Visit: Payer: Medicare Other | Attending: Internal Medicine

## 2019-07-03 DIAGNOSIS — Z23 Encounter for immunization: Secondary | ICD-10-CM | POA: Insufficient documentation

## 2019-07-03 NOTE — Progress Notes (Signed)
   Covid-19 Vaccination Clinic  Name:  UNKOWN DEFRONZO    MRN: [id number] DOB: 17-Sep-1940  07/03/2019  Mr. Bibler was observed post Covid-19 immunization for 15 minutes without incidence. He was provided with Vaccine Information Sheet and instruction to access the V-Safe system.   Mr. Mcculla was instructed to call 911 with any severe reactions post vaccine: Marland Kitchen Difficulty breathing  . Swelling of your face and throat  . A fast heartbeat  . A bad rash all over your body  . Dizziness and weakness    Immunizations Administered    Name Date Dose VIS Date Route   Pfizer COVID-19 Vaccine 07/03/2019  8:19 AM 0.3 mL 05/30/2019 Intramuscular   Manufacturer: [place]   Lot: [id number]   [place]: [id number]

## 2019-07-03 NOTE — Telephone Encounter (Signed)
Patient called requesting updated orders for PT. He said the previous order expired.

## 2019-07-03 NOTE — Telephone Encounter (Signed)
He has an appt with me next month.  Will probably have to wait until then so insurance will pay

## 2019-07-03 NOTE — Telephone Encounter (Signed)
Pt called informed that he has appointment next month with Dr.Tat will talk about and do PT referral so that insurance will cover it

## 2019-07-18 DIAGNOSIS — E119 Type 2 diabetes mellitus without complications: Secondary | ICD-10-CM | POA: Diagnosis not present

## 2019-07-18 DIAGNOSIS — Z13820 Encounter for screening for osteoporosis: Secondary | ICD-10-CM | POA: Diagnosis not present

## 2019-07-18 DIAGNOSIS — I10 Essential (primary) hypertension: Secondary | ICD-10-CM | POA: Diagnosis not present

## 2019-07-18 DIAGNOSIS — D649 Anemia, unspecified: Secondary | ICD-10-CM | POA: Diagnosis not present

## 2019-07-18 DIAGNOSIS — E039 Hypothyroidism, unspecified: Secondary | ICD-10-CM | POA: Diagnosis not present

## 2019-07-21 NOTE — Progress Notes (Signed)
Virtual Visit via Video Note The purpose of this virtual visit is to provide medical care while limiting exposure to the novel coronavirus.    Consent was obtained for video visit:  Yes.   Answered questions that patient had about telehealth interaction:  Yes.   I discussed the limitations, risks, security and privacy concerns of performing an evaluation and management service by telemedicine. I also discussed with the patient that there may be a patient responsible charge related to this service. The patient expressed understanding and agreed to proceed.  Pt location: Home Physician Location: office Name of referring provider:  Jani Gravel, MD I connected with [name] at patients initiation/request on 07/22/2019 at  1:00 PM EST by video enabled telemedicine application and verified that I am speaking with the correct person using two identifiers. Pt MRN:  [id number] Pt DOB:  [birth year] Video Participants:  [name];  Hoyle Sauer, wife supplements the history   History of Present Illness:  Patient seen today in follow-up for Parkinson's disease. "I feel pretty good." No falls since last visit.  No lightheadedness or near syncope.  He called me about coughing.  We ordered a modified barium swallow, which was completed on June 26, 2019.  There was evidence of pharyngeal dysphagia.  He had laryngeal penetration of liquids during the swallow, with evidence of aspiration.  He denies hallucination but has an occasional illusional illusion.  Wife mentions vivid dreams.  No falling out of the bed.  He is not exercising.  Current movement d/o meds:  Carbidopa/levodopa 25/100, 1.5 tablets 3 times per day.  (Increased last visit).   Current Outpatient Medications on File Prior to Visit  Medication Sig Dispense Refill  . aspirin 81 MG tablet Take 81 mg by mouth daily.     Marland Kitchen atorvastatin (LIPITOR) 20 MG tablet Take 1 tablet (20 mg total) by mouth daily at 6 PM. 30 tablet 0  .  carbidopa-levodopa (SINEMET IR) 25-100 MG tablet Take 1.5 tablets by mouth 3 (three) times daily. 504 tablet 2  . finasteride (PROSCAR) 5 MG tablet Take 5 mg by mouth daily.    Marland Kitchen glipiZIDE (GLUCOTROL) 5 MG tablet Take 5 mg by mouth 2 (two) times daily.     Marland Kitchen levothyroxine (SYNTHROID, LEVOTHROID) 75 MCG tablet Take 75 mcg by mouth daily.    Marland Kitchen losartan (COZAAR) 25 MG tablet Take 25 mg by mouth daily.    . metFORMIN (GLUCOPHAGE-XR) 750 MG 24 hr tablet Take 750 mg by mouth 2 (two) times daily.     . mirabegron ER (MYRBETRIQ) 50 MG TB24 tablet Take 50 mg by mouth daily.    . pantoprazole (PROTONIX) 40 MG tablet Take 20 mg by mouth daily.     No current facility-administered medications on file prior to visit.     Observations/Objective:   There were no vitals filed for this visit. GEN:  The patient appears stated age and is in NAD.  Neurological examination:  Orientation: The patient is alert and oriented x3. Cranial nerves: There is good facial symmetry. There is facial hypomimia.  The speech is fluent and clear. Soft palate rises symmetrically and there is no tongue deviation. Hearing is intact to conversational tone. Motor: Strength is at least antigravity x 4.   Shoulder shrug is equal and symmetric.  There is no pronator drift.  Movement examination: Tone: unable Abnormal movements: none Coordination:  There is  decremation with RAM's, with any form of RAMS, including alternating supination and pronation  of the forearm, hand opening and closing, finger taps bilaterally Gait and Station: The patient has no difficulty arising out of a deep-seated chair without the use of the hands. The patient's stride length is good with purposeful arm swing bilaterally.      Assessment and Plan:   1. AR Parkinsons disease,  -continue Carbidopa/levodopa 25/100, 1.5 tablets 3 times per day -Patient is no longer driving because of memory change. His wife is doing all the  driving. Congratulated him on that, although he is not particularly happy about it. -Asked about guanfacine, but discussed that this is really just used for pseudobulbar affect  -discussed importance of exercise!  -getting 2nd covid vaccine tomorrow  -refer to PT   2. Cerebral infarction -Talked about stroke risk factors. Talked about those risk factors which are modifiable. -Talked about importance ofblood pressure control with a goal <130/80 mm Hg.  -Talked about importance of lipid control and proper diet. Lipids should be managed intensively, with a goal LDL <70 mg/dL. In the hospital, his was 88and it is all the way down to 39 now. -As above, it was unsure of whether the patient had a punctate cerebral infarct, or whether the area of restricted diffusion was more related to the subdural hematoma he had at the same time. Regardless, it was ultimately treated as an ischemic event. He was told to remain on his aspirin -I counseled the patient on measures to reduce stroke risk, including the importance of medication compliance, risk factor control, exercise, healthy diet, and avoidance of smoking.  3.Sialorrhea             -This is commonly associated with PD.  We talked about treatments.  The patient is not a candidate for oral anticholinergic therapy because of increased risk of confusion and falls.  We discussed Botox (type A and B) and 1% atropine drops.  We discusssed that candy like lemon drops can help by stimulating mm of the oropharynx to induce swallowing.  Not that bothersome right now.    4.  Dysphagia  -Patient with modified barium swallow on June 26, 2019.  There is evidence of pharyngeal dysphagia and laryngeal penetration of liquid during the swallow.  He has a moderate aspiration risk.  Was recommended that he take medication with pure and crushed if medication was large, and is able to be  crushed.  -SLP was recommended.  Pt agreed.  Will refer.  Follow Up Instructions:    -I discussed the assessment and treatment plan with the patient. The patient was provided an opportunity to ask questions and all were answered. The patient agreed with the plan and demonstrated an understanding of the instructions.   The patient was advised to call back or seek an in-person evaluation if the symptoms worsen or if the condition fails to improve as anticipated.    Total time spent on today's visit was 20 minutes, including both face-to-face time and nonface-to-face time.  Time included that spent on review of records (prior notes available to me/labs/imaging if pertinent), discussing treatment and goals, answering patient's questions and coordinating care.   Alonza Bogus, DO

## 2019-07-22 ENCOUNTER — Telehealth (INDEPENDENT_AMBULATORY_CARE_PROVIDER_SITE_OTHER): Payer: HMO | Admitting: Neurology

## 2019-07-22 ENCOUNTER — Other Ambulatory Visit: Payer: Self-pay

## 2019-07-22 ENCOUNTER — Encounter: Payer: Self-pay | Admitting: Neurology

## 2019-07-22 DIAGNOSIS — R1319 Other dysphagia: Secondary | ICD-10-CM

## 2019-07-22 DIAGNOSIS — G20 Parkinson's disease: Secondary | ICD-10-CM

## 2019-07-23 ENCOUNTER — Ambulatory Visit: Payer: HMO

## 2019-07-23 ENCOUNTER — Ambulatory Visit: Payer: HMO | Attending: Internal Medicine

## 2019-07-23 DIAGNOSIS — Z23 Encounter for immunization: Secondary | ICD-10-CM | POA: Insufficient documentation

## 2019-07-23 NOTE — Progress Notes (Signed)
   Covid-19 Vaccination Clinic  Name:  Justin Gomez    MRN: [id number] DOB: February 28, 1941  07/23/2019  [name] was observed post Covid-19 immunization for 15 minutes without incidence. He was provided with Vaccine Information Sheet and instruction to access the V-Safe system.   [name] was instructed to call 911 with any severe reactions post vaccine: Marland Kitchen Difficulty breathing  . Swelling of your face and throat  . A fast heartbeat  . A bad rash all over your body  . Dizziness and weakness    Immunizations Administered    Name Date Dose VIS Date Route   Pfizer COVID-19 Vaccine 07/23/2019 10:32 AM 0.3 mL 05/30/2019 Intramuscular   Manufacturer: [place]   Lot: [id number]   [place]: [id number]

## 2019-07-24 ENCOUNTER — Telehealth: Payer: Self-pay | Admitting: Neurology

## 2019-07-24 DIAGNOSIS — R413 Other amnesia: Secondary | ICD-10-CM

## 2019-07-24 DIAGNOSIS — I10 Essential (primary) hypertension: Secondary | ICD-10-CM | POA: Diagnosis not present

## 2019-07-24 DIAGNOSIS — E039 Hypothyroidism, unspecified: Secondary | ICD-10-CM | POA: Diagnosis not present

## 2019-07-24 DIAGNOSIS — E785 Hyperlipidemia, unspecified: Secondary | ICD-10-CM | POA: Diagnosis not present

## 2019-07-24 DIAGNOSIS — E119 Type 2 diabetes mellitus without complications: Secondary | ICD-10-CM | POA: Diagnosis not present

## 2019-07-24 DIAGNOSIS — G20 Parkinson's disease: Secondary | ICD-10-CM | POA: Diagnosis not present

## 2019-07-24 NOTE — Telephone Encounter (Signed)
pts pcp contacted me via secure chat that pt c/o memory loss.  Did mmse and was 28/30.  Pt just saw me 2 days ago and didn't mention it.  Told PCP he could order neurocog testing.  He requested I put in order.  Ordered today.

## 2019-08-12 ENCOUNTER — Other Ambulatory Visit: Payer: Self-pay

## 2019-08-12 NOTE — Patient Outreach (Signed)
  [facility]) Care Management Chronic Special Needs Program    08/12/2019  Name: Justin Gomez, DOB: 1940-08-02  MRN: [id number]   Mr. [name] is enrolled in a chronic special needs plan for Diabetes. RNCM called to assist with completion of health risk assessment and update individualized care plan. Client answered. Two client identifiers confirmed. Client states this is not a good time to talk and request to reschedule.    Plan: Chronic care management coordinator. Outreach call rescheduled.   Thea Silversmith, RN, MSN, [place] [phone]

## 2019-08-15 ENCOUNTER — Other Ambulatory Visit: Payer: Self-pay

## 2019-08-15 NOTE — Patient Outreach (Signed)
  [facility]) Care Management Chronic Special Needs Program    08/15/2019  Name: Justin Gomez, DOB: 1941-02-25  MRN: [id number]   [name] is enrolled in a chronic special needs plan for Diabetes. RNCM called to assist client with completing health risk assessment. No answer. HIPAA compliant message left. ICP not due for update at this time.  Plan: continued outreach attempts by care management team to complete health risk assessment. RNCM will outreach per tier level to update ICP.  Thea Silversmith, RN, MSN, [place] [phone]

## 2019-08-27 ENCOUNTER — Ambulatory Visit: Payer: HMO

## 2019-08-27 ENCOUNTER — Ambulatory Visit: Payer: HMO | Attending: Neurology | Admitting: Physical Therapy

## 2019-08-27 ENCOUNTER — Other Ambulatory Visit: Payer: Self-pay

## 2019-08-27 DIAGNOSIS — R41841 Cognitive communication deficit: Secondary | ICD-10-CM

## 2019-08-27 DIAGNOSIS — R1312 Dysphagia, oropharyngeal phase: Secondary | ICD-10-CM

## 2019-08-27 DIAGNOSIS — R2689 Other abnormalities of gait and mobility: Secondary | ICD-10-CM | POA: Diagnosis not present

## 2019-08-27 DIAGNOSIS — R2681 Unsteadiness on feet: Secondary | ICD-10-CM

## 2019-08-27 DIAGNOSIS — R293 Abnormal posture: Secondary | ICD-10-CM | POA: Insufficient documentation

## 2019-08-27 NOTE — Therapy (Addendum)
[facility] [address] [place], [place], [address] Phone: [phone]   Fax:  [phone]  Physical Therapy Evaluation  Patient Details  Name: Justin Gomez MRN: [id number] Date of Birth: 1948-04-24 Referring Provider (PT): Alonza Bogus, DO   Encounter Date: 08/27/2019  PT End of Session - 08/27/19 1408    Visit Number  1    Number of Visits  9    Date for PT Re-Evaluation  10/26/19    Authorization Type  [employer]    PT Start Time  [id number]   pt arrived late   PT Stop Time  1400    PT Time Calculation (min)  32 min    Equipment Utilized During Treatment  Gait belt    Activity Tolerance  Patient tolerated treatment well    Behavior During Therapy  [facility] for tasks assessed/performed       Past Medical History:  Diagnosis Date  . BPH (benign prostatic hypertrophy)   . Diabetes mellitus   . Diverticulosis   . Gastroesophageal reflux disease 04/13/2016  . Hiatal hernia   . Hyperlipidemia   . Hypertension   . Hypothyroidism   . Lumbar herniated disc    L4    Past Surgical History:  Procedure Laterality Date  . APPENDECTOMY  1956  . KNEE SURGERY  2009   right  . SHOULDER SURGERY  2010   left    There were no vitals filed for this visit.   Subjective Assessment - 08/27/19 1331    Subjective  Was diagnosed with PD in 2019. Prior to covid was attending PD cycle. Has not been doing anything for exercise at home, haven't been walking or very active. No falls. My balance isn't as good, doesn't fall down - not as steady as he used to. Feels like his speech is not as good.    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    Patient Stated Goals  wants to slow the progression of PD    Currently in Pain?  No/denies         [facility] PT Assessment - 08/27/19 1335      Assessment    Medical Diagnosis  PD    Referring Provider (PT)  Wells Guiles Tat, DO    Onset Date/Surgical Date  07/21/17    Hand Dominance  Right    Prior Therapy  previous PT here before PD diagnosis (had drop foot)      Precautions   Precautions  Fall      Balance Screen   Has the patient fallen in the past 6 months  No    Has the patient had a decrease in activity level because of a fear of falling?   No    Is the patient reluctant to leave their home because of a fear of falling?   No      Home Environment   Living Environment  Private residence    Living Arrangements  Spouse/significant other    Type of [place] to enter    Entrance Stairs-Number of Steps  13    Entrance Stairs-Rails  Left   going up   [place]  One level    Agricultural consultant seat;Other (comment)   does not use it    Additional Comments  wife helps with driving, cooking. pt is vacuming and doing some cleaning      Prior Function   Level of Independence  Independent    Leisure  likes to read, watch TV, wants to get back into an exercise program (is a member of the [facility])      Observation/Other Assessments   Observations  resting tremor R hand      Sensation   Additional Comments  numbness in B feet due to diabetes      Coordination   Gross Motor Movements are Fluid and Coordinated  Yes      Posture/Postural Control   Posture/Postural Control  Postural limitations    Postural Limitations  Forward head;Flexed trunk    Posture Comments  R shoulder elevated       ROM / Strength   AROM / PROM / Strength  PROM;Strength      PROM   Overall PROM Comments  tightness R/L hamstrings      Strength   Overall Strength Comments  grossly 5/5      Transfers   Transfers  Sit to Stand;Stand to Sit    Sit to Stand  5: Supervision;Without upper extremity assist    Five time sit to stand comments   12.25 seconds with no UE support from standard arm chair     Stand to Sit  Without upper  extremity assist;5: Supervision    Comments  when standing, incr B knee flexion and forward flexed posture      Ambulation/Gait   Ambulation/Gait  Yes    Ambulation/Gait Assistance  5: Supervision    Ambulation Distance (Feet)  150 Feet   approx. around clinic    Assistive device  None    Gait Pattern  Step-through pattern;Decreased arm swing - left;Decreased arm swing - right;Left flexed knee in stance;Right flexed knee in stance;Decreased trunk rotation;Trunk flexed;Decreased stride length    Ambulation Surface  Indoor;Level    Gait velocity  10.59 seconds = 3.10 ft/sec     Stairs  Yes    Stairs Assistance  5: Supervision    Stair Management Technique  Alternating pattern;One rail Left;Forwards    Number of Stairs  8    Height of Stairs  6      Standardized Balance Assessment   Standardized Balance Assessment  Timed Up and Go Test      Timed Up and Go Test   Normal TUG (seconds)  9.63    Manual TUG (seconds)  10.68    Cognitive TUG (seconds)  12.53   counting backwards by 3 from 79               Objective measurements completed on examination: See above findings.              PT Education - 08/27/19 1408    Education Details  clinical findings, POC    Person(s) Educated  Patient    Methods  Explanation    Comprehension  Verbalized understanding       PT Short Term Goals - 08/27/19 1409      PT SHORT TERM GOAL #1   Title  all STGs = LTGs        PT Long Term Goals - 08/27/19 1413      PT LONG TERM GOAL #1   Title  Pt will be independent with final HEP and community fitness program in order to build upon functional gains made in therapy.    Status  New  PT LONG TERM GOAL #2   Title  Pt will undergo further assessment of miniBest to further assess fall risk - LTG  to be revised as appropriate.    Status  New      PT LONG TERM GOAL #3   Title  Pt will improve gait speed to at least 3.6 ft/sec to demo improved gait efficiency.    Baseline   3.10 ft/sec    Status  New      PT LONG TERM GOAL #4   Title  Pt will improve TUG and TUG cognitive scores to </= 10% difference of each other, for improved dual tasking with gait.    Baseline  TUG 9.63 seconds, cog TUG 12.53 seconds    Status  New             Plan - 08/27/19 1411    Clinical Impression Statement  Patient is a 79 year old male referred to Neuro OPPT for evaluation with referral for PD (diagnosed in 2019). Pt's PMH is significant for: diabetes, HTN, HLD, hypothyroid, hx of CVA (2019). Pt was attending a PD cycle class prior to COVID - has not been doing much exercising/physical activity since then. The following deficits were present during the exam: decreased functional LE strength, decreased balance, decr coordination and timing of gait, postural abnormalities, bradykinesia, decr ability with dual tasking with gait. Pt's TUG times indicate decr ability to cognitively dual task during gait. Unable to perform further balance assessment due to time constraints today. Pt would benefit from skilled PT to address these impairments and functional limitations to maximize functional mobility independence.    Personal Factors and Comorbidities  Comorbidity 3+;Time since onset of injury/illness/exacerbation    Comorbidities  diabetes, HTN, HLD, hypothyroid, hx of CVA (2019)    Examination-Activity Limitations  Stairs;Locomotion Level    Examination-Participation Restrictions  Community Activity    Stability/Clinical Decision Making  Stable/Uncomplicated    Clinical Decision Making  Low    Rehab Potential  Excellent    PT Frequency  2x / week    PT Duration  4 weeks    PT Treatment/Interventions  Gait training;Stair training;Therapeutic activities;Patient/family education;Neuromuscular re-education;Balance training;Therapeutic exercise    PT Next Visit Plan  perform miniBEST, initial HEP - seated/standing PWR moves, hamstring stretch    Consulted and Agree with Plan of Care   Patient       Patient will benefit from skilled therapeutic intervention in order to improve the following deficits and impairments:  Abnormal gait, Decreased activity tolerance, Decreased balance, Decreased range of motion, Difficulty walking, Postural dysfunction  Visit Diagnosis: Unsteadiness on feet  Other abnormalities of gait and mobility  Abnormal posture     Problem List Patient Active Problem List   Diagnosis Date Noted  . Parkinson's disease ([place]) 07/31/2017  . 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 Fall   . 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 Subdural hematoma ([place]) 07/15/2017  . Postural dizziness with near syncope 07/14/2017  . Postural hypotension 01/22/2017  . Gastroesophageal reflux disease 04/13/2016  . Cough 04/13/2016  . 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 Essential hypertension 11/29/2015  . Type 2 diabetes mellitus without complication, without long-term current use of insulin ([place]) 11/29/2015    Arliss Journey, PT, DPT  08/27/2019, 2:19 PM  [facility] [address] [place], [place], [address] Phone: [phone]   Fax:  [phone]  Name: Algert Ryland  [place] MRN: [id number] Date of Birth: 1940/08/16

## 2019-08-27 NOTE — Patient Instructions (Addendum)
   When you eat: Alternate between bite and sip - - 1:1 ratio  Swallow twice for each bite or sip  Clear your throat and re-swallow EIGHT-TEN TIMES per meal   =========================== Taking your medications in applesauce will help them transit through your throat easier  ===========================  CONTINUE TO DO YOUR  EXERCISES  3x/day  SWALLOW HARD LIKE YOU'D SWALLOW A GOLF BALL- 10-15x  TONGUE PRESS- 10x  SWALLOW and HOLD 5 seconds - 10x

## 2019-08-28 NOTE — Therapy (Signed)
[facility] [address] [place], [place], [address] Phone: [phone]   Fax:  [phone]  Speech Language Pathology Evaluation  Patient Details  Name: Justin Gomez MRN: [id number] Date of Birth: 1940-07-20 Referring Provider (SLP): Alonza Bogus, DO   Encounter Date: 08/27/2019  End of Session - 08/28/19 0928    Visit Number  1    Number of Visits  17    Date for SLP Re-Evaluation  11/25/19   90 days   SLP Start Time  [id number]    SLP Stop Time   1445    SLP Time Calculation (min)  42 min    Activity Tolerance  Patient tolerated treatment well       Past Medical History:  Diagnosis Date  . BPH (benign prostatic hypertrophy)   . Diabetes mellitus   . Diverticulosis   . Gastroesophageal reflux disease 04/13/2016  . Hiatal hernia   . Hyperlipidemia   . Hypertension   . Hypothyroidism   . Lumbar herniated disc    L4    Past Surgical History:  Procedure Laterality Date  . APPENDECTOMY  1956  . KNEE SURGERY  2009   right  . SHOULDER SURGERY  2010   left    There were no vitals filed for this visit.  Subjective Assessment - 08/27/19 1405    Subjective  Pt reports he thinks he is coughing less during meals.    Currently in Pain?  No/denies         SLP Evaluation [facility] - 08/27/19 1405      SLP Visit Information   SLP Received On  08/27/19    Referring Provider (SLP)  Alonza Bogus, DO    Onset Date  approx 2017    Medical Diagnosis  Parkinson's disease      Subjective   Subjective  "Don't take too big a bite, and she gave me the exercise with my tongue along the roof of my mouth."      General Information   HPI  Pt reports he has been diagnosed with PD since 2019. Modified (MBSS) on 06-26-19 with results as below. Regular thin with compenstions of masticath thoroughly, alternate bite/sip, multiple sips per bite/sip, intermettent throat clear/swallow, meds in puree. Pt reports he thinks he is  coughing less frequently than prior to MBSS. "Used to be I coughed every meal, now it's not every meal."      Prior Functional Status   Cognitive/Linguistic Baseline  Within functional limits    Type of Home  Apartment     Lives With  Spouse    Vocation  Retired      Associate Professor   Overall Cognitive Status  Impaired/Different from baseline    Attention  --   pt reports he "lose (his) train of thought all the time"     Auditory Comprehension   Overall Auditory Comprehension  Appears within functional limits for tasks assessed      Verbal Expression   Overall Verbal Expression  Appears within functional limits for tasks assessed      Oral Motor/Sensory Function   Overall Oral Motor/Sensory Function  Impaired    Labial ROM  Within Functional Limits    Labial Coordination  WFL    Lingual ROM  Within Functional Limits    Lingual Strength  Reduced   reduced rt>lt   Lingual Coordination  Reduced      Motor Speech   Overall Motor Speech  Appears within functional  limits for tasks assessed    Phonation  Normal   averaged WNL volume for the evaluation today     Pt was educated re: swallowing precautions given to him at his modified (MBSS) - pt did not recall any of these spontaneously. Written cues were provided as well as demonstration cues. Pt then ate cereal bar and drank water requiring max A consistently for following swallow precautions, faded to occasional mod A.  Pt recalled and correctly performed lingual press exercise and reports having done this exercise routinely x30 reps/day since the MBSS. In addition, SLP also provided pt with effortful swallow and Mendelsohn maneuver to improve overall swallow strength and to maximize muscles necessary to effectively deflect the epiglottis (to try to compensate as best as possible for reported structural dysphagia). Pt req'd mod A consistently faded to occasional min A especially with Mendelsohn.  Pt will eventually require cognitive  linguistic assessment since memory and processing speed were ID'd today as deficits. As ST progresses more deficits may be revealed in cognitive linguistics.                SLP Education - 08/28/19 [phone]    Education Details  HEP procedure, swallow precautions, swallow precautions with POs    Person(s) Educated  Patient    Methods  Explanation;Demonstration;Verbal cues;Handout    Comprehension  Verbalized understanding;Returned demonstration;Verbal cues required;Need further instruction       SLP Short Term Goals - 08/28/19 1207      SLP SHORT TERM GOAL #1   Title  pt will follow swallow precautions with POs with rare min A over 3 sessions    Time  4    Period  Weeks    Status  New      SLP SHORT TERM GOAL #2   Title  pt will clear throat and reswallow when experiencing hydrophonia ("wet" voice) with min nonverbal cues in  sessions.    Time  4    Period  Weeks    Status  New      SLP SHORT TERM GOAL #3   Title  pt will complete HEP for swallowing deficits with rare min A in three sessions    Time  4    Period  Weeks    Status  New      SLP SHORT TERM GOAL #4   Title  pt will complete cognitve linguistic assessment in first 6 sessions, if clinicially indicated    Time  3    Period  Weeks    Status  New       SLP Long Term Goals - 08/28/19 1209      SLP LONG TERM GOAL #1   Title  pt will complete HEP for swallowing deficits with modified independence in three sessions    Time  8    Period  Weeks   or 17 sessions, for all LTGs   Status  New      SLP LONG TERM GOAL #2   Title  pt will demo awareness of hydrophonic voice by clearing throat/reswallowing independently in 3 sessions    Time  8    Period  Weeks    Status  New      SLP LONG TERM GOAL #3   Title  pt will follow swallow precautions with POs with modified independence in 4 sessions    Time  8    Period  Weeks    Status  New  Plan - 08/28/19 0929    Clinical Impression Statement  Pt  presents primarily with dysphagia due to Parkison's disease - compensatory strategies and exercises were initiated and reviewed with pt today. Secondly, pt presents with cognitive communication deficits in at least memory and processing speed. Pt will likely require cognitive communication assessment at some point in the plan of care. Pt's volume, currently, is WNL (average low 70s dB) in 10 minuts mod complex conversation. Pt would benefit from skilled ST to address pt's cognitive communication defiicts as well as his dysphagia.    Speech Therapy Frequency  2x / week    Duration  --   8 weeks, or 17 total sessions   Treatment/Interventions  [place]iration precaution training;Pharyngeal strengthening exercises;Diet toleration management by SLP;Trials of upgraded texture/liquids;Cueing hierarchy;Cognitive reorganization;Language facilitation;Internal/external aids;Patient/family education;SLP instruction and feedback;Compensatory strategies    Potential to Achieve Goals  Good    Potential Considerations  Ability to learn/carryover information       Patient will benefit from skilled therapeutic intervention in order to improve the following deficits and impairments:   Dysphagia, oropharyngeal phase  Cognitive communication deficit    Problem List Patient Active Problem List   Diagnosis Date Noted  . Parkinson's disease ([place]) 07/31/2017  . 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 Fall   . 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)   . Subdural hematoma ([place]) 07/15/2017  . Postural dizziness with near syncope 07/14/2017  . Postural hypotension 01/22/2017  . Gastroesophageal reflux disease 04/13/2016  . Cough 04/13/2016  . 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 Essential hypertension 11/29/2015  . Type 2 diabetes mellitus without complication, without long-term current use of insulin ([place]) 11/29/2015    Brucha Ahlquist ,MS, [place]  08/28/2019, 12:12  PM  [facility] [address] [place], [place], [address] Phone: [phone]   Fax:  [phone]  Name: Justin Gomez MRN: [id number] Date of Birth: March 07, 1941

## 2019-09-03 ENCOUNTER — Ambulatory Visit: Payer: HMO | Admitting: Physical Therapy

## 2019-09-03 ENCOUNTER — Ambulatory Visit: Payer: HMO

## 2019-09-09 ENCOUNTER — Other Ambulatory Visit: Payer: Self-pay

## 2019-09-09 NOTE — Patient Outreach (Addendum)
[facility]) Care Management Chronic Special Needs Program  09/09/2019  Name: Justin Gomez DOB: 02-Mar-1941  MRN: [id number]  Mr. Levan Sooy is enrolled in a chronic special needs plan for  Diabetes. A completed health risk assessment has not been received from the client. RNCM called to assist client with completing health risk assessment and to update care plan. No answer. HIPAA compliant message left. 3rd outreach attempt.   The client's individualized care plan was updated based on available data.   Goals Addressed            This Visit's Progress   . Client will not report change from baseline and no repeated symptoms of stroke with in the next 9-12 months   On track    Continue to follow up with provider as scheduled. Continue to take your medications as prescribed. Mailed education: "what you can do to prevent a second stroke" and "controlling blood pressure through lifestyle". Please read and call if you have any questions.  Stroke Symptoms-Spot a stroke F.A.S.T. FACE DROOPING Does one side of the face droop or is it numb? Ask the person to smile. ARM WEAKNESS Is one arm weak or numb? Ask the person to raise both arms. Does one arm drift downward? SPEECH DIFFICULTY Is speech slurred, are they unable to speak, or are they hard to understand? Ask the person to repeat a simple sentence, like "the sky is blue." Is the sentence repeated correctly? TIME TO CALL 9-1-1 If the person shows any of these symptoms, even if the symptoms go away, call 9-1-1 and get them to the hospital immediately.    . Client will verbalize knowledge of self management of Hypertension as evidences by BP reading of 140/90 or less; or as defined by provider   On track    Last noted BP 133/75 on 02/13/2019.  Goal renewed 2021: Follow up with your doctor as scheduled. Take your medications as prescribed by your doctor. Ask your doctor "what is my target blood pressure  range". Monitor your blood pressure and take results to your doctor's appointment.  Monitor the amount of salt you are eating. Continue to exercise as tolerated and remain active. Continue to participate in rehabilitation therapy sessions as directed. Eat heart healthy diet (full of fruits, vegetables, whole grains, lean protein, water--limit salt, fat, and sugar/simple carb intake). Mailed education: "controlling blood pressure through lifestyle" please review and call if you have any questions.    Marland Kitchen HEMOGLOBIN A1C < 7       A1C 5.6 on 04/10/2019 Great job!   Continue these Diabetes self management actions:  Glucose monitoring per provider recommendations  Eat Healthy  Visit provider every 3-6 months as directed  Hbg A1C level every 3-6 months.  Take your medications as recommended.  Attend provider visits as scheduled.  Ask your doctor, "what is my Target A1C goal?"  Ask your doctor, "what is my Target blood sugar range?"     . Obtain annual  Lipid Profile, LDL-C   On track    It is important to follow up with your provider as scheduled for recommended labs. Try to avoid saturated fats, trans-fats, and eat more fiber. Mailed education: "Heart healthy diet". Please review and call if you have any questions.    . Obtain Annual Eye (retinal)  Exam    On track    Unable to reach. NO data available.  Goal renewed 2021 It is important to follow up with your provider for  recommended yearly exam. Please call to schedule if you have not had or do not have one scheduled.    . Obtain Annual Foot Exam   On track    Unable to reach-unable to confirm.  It is important to see your provider as scheduled for recommended yearly exam.     . Obtain annual screen for micro albuminuria (urine) , nephropathy (kidney problems)   On track    Unable to confirm.  Goal renewed 2021 It is important to attend provider visits as scheduled for recommended labs. This test looks at how your  kidneys are working.    . Obtain Hemoglobin A1C at least 2 times per year   On track    Done 10/09/2018; done 04/10/2019 A1C 5.6%  Goal renewed 2021: It is important to attend provider visits as scheduled for recommended labs.    . Visit Primary Care Provider or Endocrinologist at least 2 times per year    On track    It is important to follow up with your provider as scheduled for yearly physical and recommended labs/procedure and medication refills.  Please call your doctor to schedule an annual wellness visit if you have not had one or do not have one scheduled.      Plan:  . Send unsuccessful outreach letter with a copy of individualized care plan to client . Send individualized care plan to provider . Send educational material  Chronic care management coordinator will attempt outreach per tier level within the next 9-12 months.  Thea Silversmith, RN, MSN, [place] [phone]

## 2019-09-11 ENCOUNTER — Ambulatory Visit: Payer: HMO | Admitting: Physical Therapy

## 2019-09-18 ENCOUNTER — Ambulatory Visit: Payer: Self-pay

## 2019-09-23 ENCOUNTER — Ambulatory Visit: Payer: HMO | Admitting: Psychology

## 2019-09-23 ENCOUNTER — Ambulatory Visit (INDEPENDENT_AMBULATORY_CARE_PROVIDER_SITE_OTHER): Payer: HMO | Admitting: Psychology

## 2019-09-23 ENCOUNTER — Other Ambulatory Visit: Payer: Self-pay

## 2019-09-23 ENCOUNTER — Encounter: Payer: Self-pay | Admitting: Psychology

## 2019-09-23 DIAGNOSIS — G3184 Mild cognitive impairment, so stated: Secondary | ICD-10-CM

## 2019-09-23 DIAGNOSIS — R4189 Other symptoms and signs involving cognitive functions and awareness: Secondary | ICD-10-CM

## 2019-09-23 DIAGNOSIS — G20 Parkinson's disease: Secondary | ICD-10-CM | POA: Diagnosis not present

## 2019-09-23 NOTE — Progress Notes (Signed)
   Psychometrician Note   Cognitive testing was administered to Justin Gomez by Milana Kidney, B.S. (psychometrist) under the supervision of Dr. Christia Reading, Ph.D., licensed psychologist. Justin Gomez did not appear overtly distressed by the testing session per behavioral observation or responses across self-report questionnaires. Dr. Christia Reading, Ph.D. checked in with Justin Gomez as needed to manage any distress related to testing procedures (if applicable). Rest breaks were offered.    The battery of tests administered was selected by Dr. Christia Reading, Ph.D. with consideration to Justin Gomez current level of functioning, the nature of his symptoms, emotional and behavioral responses during interview, level of literacy, observed level of motivation/effort, and the nature of the referral question. This battery was communicated to the psychometrist. Communication between Dr. Christia Reading, Ph.D. and the psychometrist was ongoing throughout the evaluation and Dr. Christia Reading, Ph.D. was immediately accessible at all times. Dr. Christia Reading, Ph.D. provided supervision to the psychometrist on the date of this service to the extent necessary to assure the quality of all services provided.    Justin Gomez will return within approximately 1-2 weeks for an interactive feedback session with Dr. Melvyn Novas at which time his test performances, clinical impressions, and treatment recommendations will be reviewed in detail. Justin Gomez understands he can contact our office should he require our assistance before this time.  A total of 115 minutes of billable time were spent face-to-face with Justin Gomez by the psychometrist. This includes both test administration and scoring time. Billing for these services is reflected in the clinical report generated by Dr. Christia Reading, Ph.D..  This note reflects time spent with the psychometrician and does not include test scores or any clinical  interpretations made by Dr. Melvyn Novas. The full report will follow in a separate note.

## 2019-09-23 NOTE — Progress Notes (Addendum)
NEUROPSYCHOLOGICAL EVALUATION Justin Gomez. [facility]  Reason for Referral:   Justin Gomez is a 79 y.o. right-handed Caucasian male referred by Alonza Bogus, D.O., to characterize his current cognitive functioning and assist with diagnostic clarity and treatment planning in the context of subjective cognitive decline and a history of Parkinson's disease.  Assessment and Plan:   Clinical Impression(s): Behavioral observations during testing were noteworthy for poor persistence at times, especially when challenged (e.g., delayed recall trials across memory measures). There were several instances where Justin Gomez stated that he "cannot do that" and did not provide an attempt despite encouragement provided by the examiner. The evaluation was also discontinued prematurely due to increasing levels of fatigue, leading to some domains being inadequately sampled (e.g., executive functioning, visuospatial functioning). As such, the current evaluation should be interpreted with caution and lower performances may have been artificially lowered due to testing variables described above.   If taken at face value, Justin Gomez pattern of performance is suggestive of primary impairments surrounding verbal fluency, receptive language, and learning and memory, with additional relative weaknesses across processing speed, complex attention (i.e., working memory), and visuoconstructional abilities (i.e., clock drawing). Performance was within normal limits across domains of basic attention, assessed executive functioning, basic language and confrontation naming, and visuospatial functioning. Justin Gomez denied difficulties completing instrumental activities of daily living (ADLs) independently. Despite concerns surrounding test engagement, I do feel that there is a level of true cognitive dysfunction. As such, he meets criteria for a Mild Neurocognitive Disorder (formerly "mild  cognitive impairment") at the present time.  The etiology for his weaknesses is somewhat unclear. Individuals with Parkinson's disease commonly have accompanying cognitive dysfunction and this could certainly represent the primary culprit. However, his pattern of performance is not fully consistent with what is generally expected with Parkinson's disease, potentially impacted by varying test engagement and fatigue limiting the comprehensiveness of the current evaluation. 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 His age is quite advanced relative to typical MSA onset (50 to 60 years) and outside of some orthostatic hypotension, he does not appear to display significant autonomic dysfunction which would place this condition higher on his differential. 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 Namely, he reported gait abnormalities (including a wide-based ataxic gait), increased urinary urgency and instances of incontinence, and ongoing cognitive dysfunction. Prior brain MRI showing ventriculomegaly and his wife's report of cognitive dysfunction being present over the past few months rather than years is further consistent with this presentation. Continued medical monitoring will be important moving forward.   Recommendations: A repeat neuropsychological evaluation in 12-18 months (or sooner if functional decline is noted) is recommended to assess the trajectory of future cognitive decline should it occur. This will also aid in future efforts towards improved diagnostic clarity. Future scheduling may consider a morning appointment rather than one in the afternoon, as well as spreading out the evaluation over two consecutive days rather than one single appointment, to combat symptoms of fatigue and tolerance.   As described above,  there are some concerns surrounding normal pressure hydrocephalus. Justin Gomez should discuss the pros and cons of performing a lumbar puncture with Dr. Carles Collet and/or his PCP to better determine if this condition is indeed present. Alternatively, a DaT scan could be used to better rule in a dopamine transporter  abnormality consistent with Parkinson's disease if a less invasive approach is sought. If present, active treatment for NPH would likely lead to improvements in his clinical presentation surrounding gait abnormalities and cognitive dysfunction.   Justin Gomez is encouraged to attend to lifestyle factors for brain health (e.g., regular physical exercise, good nutrition habits, regular participation in cognitively-stimulating activities, and general stress management techniques), which are likely to have benefits for both emotional adjustment and cognition. Optimal control of vascular risk factors (including safe cardiovascular exercise and adherence to dietary recommendations) is encouraged.  Performance across neurocognitive testing is not a strong predictor of an individual's safety operating a motor vehicle. Should his family wish to pursue a formalized driving evaluation, they would be encouraged to contact [employer] in [place], [place] at [phone]. Another option would be through [facility]; however, the latter would likely require a referral from a medical doctor. [facility] can be reached directly at [phone].   Especially if memory scores reflect true scores, it will be important for Justin Gomez to have another person with him when in situations where he may need to process information, weigh the pros and cons of different options, and make decisions, in order to ensure that he fully understands and recalls all information to be considered.  When learning new information, he would benefit from information being broken up into small, manageable pieces. He may also  find it helpful to articulate the material in his own words and in a context to promote encoding at the onset of a new task. This material may need to be repeated multiple times to promote encoding. All important information should also be provided in written format to improve recollection.   To address problems with processing speed, he may wish to consider:   -Ensuring that he is alerted when essential material or instructions are being presented   -Adjusting the speed at which new information is presented   -Allowing for more time in comprehending, processing, and responding in conversation  To address problems with complex attention, he may wish to consider:   -Avoiding external distractions when needing to concentrate   -Limiting exposure to fast paced environments with multiple sensory demands   -Writing down complicated information and using checklists   -Attempting and completing one task at a time (i.e., no multi-tasking)   -Verbalizing aloud each step of a task to maintain focus   -Reducing the amount of information considered at one time  Review of Records:   Mr. 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 He currently takes carbidopa/levodopa 25/100, 1 tablet 3 times per day. He had reported increased coughing behaviors and a modified barium swallow was ordered; results revealed evidence of pharyngeal dysphagia. He had one reported fall since his prior visit with Dr. Carles Collet where he tripped over a curb while getting out of a car. He denied sustaining any injuries and was able to get up independently. At that time, he denied symptoms of lightheadedness, near syncope, or hallucinations. Regarding the latter, he did endorse some illusions rather than hallucinations. His mood was described as good. He does not currently drive due to memory changes; other ADLs were described as intact. Given  report of memory concerns/other cognitive dysfunction, Mr. Umemoto was referred for a comprehensive neuropsychological evaluation to characterize his cognitive abilities and to assist with diagnostic clarity and treatment planning. Prior performance on a brief cognitive  screening instrument (MMSE) with his PCP was 28/30.   Brain MRI on 02/05/2004 did not reveal any intracranial abnormalities or diabetic related ischemic small vessel disease. Brain MRI on 09/15/2016 in the context of a fall revealed borderline advanced for age parenchymal volume loss. Brain MRI on 07/15/2017 in the context of another fall revealed an 20mm acute infarct in the left parietal cortex and a trace subdural hematoma along the left occipital convexity measuring only 66mm in thickness. Central volume loss and ventriculomegaly were also noted.   Past Medical History:  Diagnosis Date  . BPH (benign prostatic hypertrophy)   . 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 Diverticulosis   . Essential hypertension 11/29/2015  . Gastroesophageal reflux disease 04/13/2016  . Hiatal hernia   . Hyperlipidemia   . Hypothyroidism   . Lumbar herniated disc    L4  . Multiple system atrophy   . Parkinson's disease 07/31/2017  . Postural dizziness with near syncope 07/14/2017  . Postural hypotension 01/22/2017  . 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 Type 2 diabetes mellitus without complication, without long-term current use of insulin ([place]) 11/29/2015    Past Surgical History:  Procedure Laterality Date  . APPENDECTOMY  1956  . KNEE SURGERY  2009   right  . SHOULDER SURGERY  2010   left    Current Outpatient Medications:  .  aspirin 81 MG tablet, Take 81 mg by mouth daily. , Disp: , Rfl:  .  atorvastatin (LIPITOR) 20 MG tablet, Take 1 tablet (20 mg total) by mouth daily at 6 PM., Disp: 30 tablet, Rfl: 0 .  carbidopa-levodopa (SINEMET IR) 25-100 MG tablet, Take 1.5 tablets by mouth 3  (three) times daily., Disp: 504 tablet, Rfl: 2 .  finasteride (PROSCAR) 5 MG tablet, Take 5 mg by mouth daily., Disp: , Rfl:  .  glipiZIDE (GLUCOTROL) 5 MG tablet, Take 5 mg by mouth 2 (two) times daily. , Disp: , Rfl:  .  levothyroxine (SYNTHROID, LEVOTHROID) 75 MCG tablet, Take 75 mcg by mouth daily., Disp: , Rfl:  .  losartan (COZAAR) 25 MG tablet, Take 25 mg by mouth daily., Disp: , Rfl:  .  metFORMIN (GLUCOPHAGE-XR) 750 MG 24 hr tablet, Take 750 mg by mouth 2 (two) times daily. , Disp: , Rfl:  .  mirabegron ER (MYRBETRIQ) 50 MG TB24 tablet, Take 50 mg by mouth daily., Disp: , Rfl:  .  pantoprazole (PROTONIX) 40 MG tablet, Take 20 mg by mouth daily., Disp: , Rfl:   Clinical Interview:   Cognitive Symptoms: Decreased short-term memory: Endorsed. He and his wife described largely generalized symptoms of forgetfulness. He provided a specific example of looking up the name of a particular show on television, but forgetting what he looked up shortly after. He also reported a longstanding weakness remembering the names of other individuals; however, this was said to be worsening over time. Decreased long-term memory: Denied. Decreased attention/concentration: Denied. However, he did report instances of losing his train of thought and commonly trails off at the ends of sentences while conversing with others.  Reduced processing speed: Endorsed. Specifically, his wife reported that he seems "less sharp;" he was in agreement with this.  Difficulties with executive functions: Endorsed. He reported longstanding deficits in organization and complex planning dating back prior to his diagnosis of Parkinson's disease. He denied trouble with impulsivity or overt personality changes. His wife described masked facies symptoms in that it has become harder for things in  the environment to elicit an emotional response from Mr. Bass.  Difficulties with emotion regulation: Denied. Difficulties with receptive  language: Denied. Difficulties with word finding: Endorsed. Decreased visuoperceptual ability: Denied.  Trajectory of deficits: Per his wife, cognitive deficits were said to be noticeable for at least the past few months. However, a more firm timeframe was unable to be discerned.   Difficulties completing ADLs: Largely denied. Mr. Melucci denied difficulties managing medications or personal finances. He does not drive, stating that his neurologist stopped him from driving due to him "flunking the test." This was later revealed to mean poor performance across a clock drawing and cognitive screening task rather than an on-the-road driving assessment.   Additional Medical History: History of traumatic brain injury/concussion: Endorsed. On or around 07/14/2017, Mr. Markson described experiencing a syncopal episode, leading to him falling and hitting his head. While a head CT on 07/14/2017 did not reveal any intracranial abnormality, a brain MRI performed the next day revealed a trace subdural hematoma along the left occipital convexity measuring only 52mm in thickness, as well as an 75mm acute infarct in the left parietal cortex. No other head injuries/concussions were reported.   History of stroke: Endorsed (see above). History of seizure activity: Denied. History of known exposure to toxins: Denied. Symptoms of chronic pain: Denied. Experience of frequent headaches/migraines: Denied. Frequent instances of dizziness/vertigo: Endorsed. He reported symptoms consistent with orthostatic hypotension, including dizziness/vertigo sensations when standing up quickly or making sudden movements.   Sensory changes: He reported a loss in his sense of smell which predated his diagnosis of Parkinson's disease. Other sensory changes/difficulties (e.g., vision, hearing, or taste) were denied.  Balance/coordination difficulties: Endorsed. He described his balance as "not great." His wife described it as slowed and him  having an abnormal gait. He denied any recent falls, stating that his last fall likely occurred 6-8 months prior and did not result in any serious injury. He was unclear if levodopa medications had improved his gait, stating that he largely felt the same now as he did prior to starting these medications. Other motor difficulties: Denied. Specifically, he denied the experience of any tremulous behaviors in his extremities.   Other reported symptoms: Mr. Fuentez reported trouble with incontinence, including increased urgency and instances where he is not able to make it to the restroom in time. He denied evidence of myoclonic jerking behaviors, oculomotor dysfunction, or noted fluctuations in alertness. He also denied the presence of autonomic dysfunction outside of some orthostatic hypotension (e.g., anhidrosis, irregular heartbeat, breathing difficulties, etc.).    Sleep History: Estimated hours obtained each night: 7-8 hours. Difficulties falling asleep: Denied. Difficulties staying asleep: Endorsed. Difficulties were attributed to urinary urgency and him needing to use the restroom several times throughout the night. He generally denied difficulties falling back asleep after using the restroom.  Feels rested and refreshed upon awakening: Endorsed. Specifically, he reported feeling "okay" when awakening in the morning.   History of snoring: Endorsed. History of waking up gasping for air: Denied. Witnessed breath cessation while asleep: Denied.  History of vivid dreaming: Denied. Excessive movement while asleep: Denied. Instances of acting out his dreams: Endorsed. His wife reported occasional instances where he will talk or make strange noises while asleep. She also reported more rare instances of excessive movement or times where he may have been acting out dreams.   Psychiatric/Behavioral Health History: Depression: Denied. He described his current mood as "good" and denied a history of  previous mental health diagnoses or concerns.  Current or remote suicidal ideation, intent, or plan was likewise denied.  Anxiety: Denied. Mania: Denied. Trauma History: Denied. Visual/auditory hallucinations: Denied. However, he did acknowledge what appear to be illusions (e.g., misperceiving a true object in his environment like a chair) rather than frank hallucinations.  Delusional thoughts: Denied.  Tobacco: Denied. Alcohol: He reported rare alcohol consumption currently. He acknowledged a history of alcohol abuse. While he did not provide an estimation, his wife reported that he drank to inebriation each night during the evenings. He denied this pattern of consumption leading to social/vocational difficulties and did not require any formalized treatment to diminish alcohol intake.  Recreational drugs: Denied. Caffeine: Denied.   Family History: Problem Relation Age of Onset  . Cerebral aneurysm Father   . Hypertension Father   . Diabetes Mother   . Hypertension Brother   . Diabetes Sister   . Hypertension Sister   . Healthy Son   . Diverticulitis Daughter   . Colon cancer Neg Hx    This information was confirmed by Mr. Calahan.  Academic/Vocational History: Highest level of educational attainment: 16 years. 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 He described himself as a well-rounded A/B/C student in academic settings.  History of developmental delay: Denied. History of grade repetition: Denied. Enrollment in special education courses: Denied. History of LD/ADHD: Denied.  Employment: Retired. He previously worked as an Investment banker, corporate.   Evaluation Results:   Behavioral Observations: Mr. Vitiello was accompanied by his wife, arrived to his appointment on time, and was appropriately dressed and groomed. He appeared alert and oriented. He ambulated with appropriate speed, but exhibited an abnormal gait (i.e., wide-based ataxic gait).  Gross motor functioning appeared intact upon informal observation and no abnormal movements (e.g., tremors) were noted. His affect was flat throughout the interview and testing procedures. Masked facies could not be verified directly due to mask wearing requirements given the ongoing COVID-19 pandemic. Spontaneous speech was fluent and word finding difficulties were not observed. Thought processes were coherent, organized, and normal in content during interview. Insight into his cognitive difficulties was difficult to determine given variable test engagement. During testing, Mr. Berenson exhibited poor persistence, especially when challenged (e.g., delayed recall trials across memory measures). There were several instances where Mr. Joynes would state that he "cannot do that" and did not provide an attempt despite encouragement provided by the examiner (e.g., delayed recall trials of memory measures, NAB auditory comprehension). The evaluation was also discontinued prematurely due to increasing levels of fatigue, leading to some domains (namely visuospatial and executive functioning) being inadequately sampled.   Adequacy of Effort: The validity of neuropsychological testing is limited by the extent to which the individual being tested may be assumed to have exerted adequate effort during testing. Mr. Custard expressed his intention to perform to the best of his abilities. However, as described above, testing persistence was poor, especially when challenged. Scores across stand-alone and embedded performance validity measures were variable, with several scoring below expectation. As such, the current evaluation should be interpreted with caution and lower performances may have been artificially lowered due to testing variables described above.  Test Results: Mr. Hijazi was largely oriented at the time of the current evaluation. Points were lost for him stating the incorrect date and day of the  week.  Intellectual abilities based upon educational and vocational attainment were estimated to be in the average range. Premorbid abilities were estimated to be within the above average range based upon a  single-word reading test.   Processing speed was variable, ranging from the exceptionally low to average normative ranges. Basic attention was average. More complex attention (e.g., working memory) was below average. Assessed executive functioning (visuo-motor cognitive flexibility, response inhibition, and nonverbal abstract reasoning) was average.  Assessed receptive language abilities were exceptionally low but likely limited by poor test engagement. Assessed expressive language (e.g., verbal fluency and confrontation naming) was variable. Verbal fluency was exceptionally low to well below average while confrontation naming was above average.      Assessed visuospatial/visuoconstructional abilities were variable. Points were lost on his drawing of a clock due to him placing the numbers outside of the main circle with incorrect hand placement. Initially, he only put numbers 12, 3, 6, and 9 on the clock face and only placed additional numbers when prompted by the examiner. Performance on a mental spatial manipulation task was below average.    Learning (i.e., encoding) of novel verbal and visual information was exceptionally low to well below average. Spontaneous delayed recall (i.e., retrieval) of previously learned information was exceptionally low to below average and likely impacted by poor test engagement. Retention rates were 0% across a story learning task, 0% across a list learning task, and 100% (raw score of 2) across a shape learning task. Performance across recognition tasks was variable, with very poor performances across verbal tasks and appropriate performance across a visual task, suggesting some evidence for information consolidation.   Results of emotional screening instruments  suggested that recent symptoms of generalized anxiety were in the minimal range, while symptoms of depression were within normal limits. A screening instrument assessing recent sleep quality suggested the presence of minimal sleep dysfunction. Responses across a Parkinson's disease symptom questionnaire suggested primary areas of subjective impairment surrounding cognitive dysfunction.   Tables of Scores:   Note: This summary of test scores accompanies the interpretive report and should not be considered in isolation without reference to the appropriate sections in the text. Descriptors are based on appropriate normative data and may be adjusted based on clinical judgment. The terms "impaired" and "within normal limits (WNL)" are used when a more specific level of functioning cannot be determined.       Effort Testing:   DESCRIPTOR       Dot Counting Test: --- --- Below Expectation  HVLT-R Recognition Discrimination Index: --- --- Below Expectation  BVMT-R Retention Percentage: --- --- Within Expectation       Orientation:      Raw Score Percentile   NAB Orientation, Form 1 27/29 --- ---       Intellectual Functioning:           Standard Score Percentile   Test of Premorbid Functioning: [id number] Above Average       Memory:          Wechsler Memory Scale (WMS-IV):                       Raw Score (Scaled Score) Percentile     Logical Memory I 5/53 (2) <1 Exceptionally Low    Logical Memory II 0/39 (1) <1 Exceptionally Low    Logical Memory Recognition 9/23 <2 Exceptionally Low       Hopkins Verbal Learning Test (HVLT-R), Form 1: Raw Score (T Score) Percentile     Total Trials 1-3 5/36 (15) <1 Exceptionally Low    Delayed Recall 0/12 (29) 2 Exceptionally Low    Recognition Discrimination Index 4 (21) <1 Exceptionally Low  True Positives 8 --- ---      False Positives 4 --- ---  *From Riki Sheer (2016)          Brief Visuospatial Memory Test (BVMT-R), Form 1: Raw Score (T Score)  Percentile     Total Trials 1-3 4/36 (35) 7 Well Below Average    Delayed Recall 2/12 (38) 12 Below Average    Recognition Discrimination Index 5 (48) 42 Average      Recognition Hits 6/6 (56) 73 Average      False Positive Errors 1 (40) 16 Below Average  *From Riki Sheer (2016)          Attention/Executive Function:          Trail Making Test (TMT): Raw Score (Scaled Score) Percentile     Part A 60 secs.,  0 errors (7) 16 Below Average    Part B 172 secs.,  1 error (8) 25 Average  *Based on [employer]'s Older Normative Studies (MOANS)          Symbol Digit Modalities Test (SDMT): Raw Score (Z-Score) Percentile     Oral 18 (-2.57) 1 Exceptionally Low       NAB Attention Module, Form 1: T Score Percentile     Digits Forward 50 50 Average    Digits Backwards 40 16 Below Average       D-KEFS Color-Word Interference Test: Raw Score (Scaled Score) Percentile     Color Naming 51 secs. (3) 1 Exceptionally Low    Word Reading 25 secs. (10) 50 Average    Inhibition 169 secs. (1) <1 Exceptionally Low      Total Errors 4 errors (9) 37 Average    Inhibition/Switching 95 secs. (8) 25 Average      Total Errors 2 errors (11) 63 Average       Language:           Raw Score Percentile   PPVT Screening Instrument: 15/16 --- Within Expectation  Sentence Repetition: 14/22 27 Average       Verbal Fluency Test: Raw Score (Scaled Score) Percentile     Phonemic Fluency (CFL) 14 (5) 5 Well Below Average    Category Fluency 15 (3) 1 Exceptionally Low  *Based on [employer] Older Normative Studies (MOANS)          NAB Language Module, Form 1: T Score Percentile     Auditory Comprehension 29 2 Exceptionally Low    Naming 31/31 (59) 82 Above Average       Visuospatial/Visuoconstruction:      Raw Score Percentile   Clock Drawing: 6/10 --- Impaired        Scaled Score Percentile   WAIS-IV Matrix Reasoning: 9 37 Average  WAIS-IV Visual Puzzles: 7 16 Below Average       Mood and Personality:      Raw Score  Percentile   Geriatric Depression Scale: 6 --- Within Normal Limits  Geriatric Anxiety Scale: 1 --- Minimal    Somatic 0 --- Minimal    Cognitive 1 --- Minimal    Affective 0 --- Minimal       Additional Questionnaires:      Raw Score Percentile   PROMIS Sleep Disturbance Questionnaire: 14 --- None to Slight       Parkinson's Disease Questionnaire-39: Raw Score Percentile     Mobility 2 5 Minimally Affected    Activities of Daily Living 4 17 Mildly Affected    Emotional Well-Being 0 0 Minimally Affected    Stigma 0  0 Minimally Affected    Social Support 0 0 Minimally Affected    Cognitions 7 44 Moderately Affected    Communication 2 17 Mildly Affected    Bodily Discomfort 0 0 Minimally Affected   Informed Consent and Coding/Compliance:   Mr. Boyne was provided with a verbal description of the nature and purpose of the present neuropsychological evaluation. Also reviewed were the foreseeable risks and/or discomforts and benefits of the procedure, limits of confidentiality, and mandatory reporting requirements of this provider. The patient was given the opportunity to ask questions and receive answers about the evaluation. Oral consent to participate was provided by the patient.   This evaluation was conducted by Christia Reading, Ph.D., licensed clinical neuropsychologist. Mr. Paris completed a comprehensive clinical interview with Dr. Melvyn Novas, billed as one unit [phone], and 115 minutes of cognitive testing and scoring, billed as one unit [phone] and three additional units [id number]. Psychometrist Milana Kidney, B.S., assisted Dr. Melvyn Novas with test administration and scoring procedures. As a separate and discrete service, Dr. Melvyn Novas spent a total of 180 minutes in interpretation and report writing billed as one unit [phone] and two units [id number].

## 2019-09-24 ENCOUNTER — Ambulatory Visit: Payer: HMO

## 2019-09-24 ENCOUNTER — Ambulatory Visit: Payer: HMO | Attending: Neurology | Admitting: Physical Therapy

## 2019-09-24 ENCOUNTER — Encounter: Payer: Self-pay | Admitting: Psychology

## 2019-09-24 DIAGNOSIS — R1312 Dysphagia, oropharyngeal phase: Secondary | ICD-10-CM

## 2019-09-24 DIAGNOSIS — R41841 Cognitive communication deficit: Secondary | ICD-10-CM | POA: Diagnosis not present

## 2019-09-24 DIAGNOSIS — R2689 Other abnormalities of gait and mobility: Secondary | ICD-10-CM | POA: Insufficient documentation

## 2019-09-24 DIAGNOSIS — R2681 Unsteadiness on feet: Secondary | ICD-10-CM | POA: Diagnosis not present

## 2019-09-24 DIAGNOSIS — R293 Abnormal posture: Secondary | ICD-10-CM | POA: Insufficient documentation

## 2019-09-24 DIAGNOSIS — G3184 Mild cognitive impairment, so stated: Secondary | ICD-10-CM | POA: Insufficient documentation

## 2019-09-24 HISTORY — DX: Mild cognitive impairment of uncertain or unknown etiology: G31.84

## 2019-09-24 NOTE — Patient Instructions (Signed)
Access Code: [facility] URL: [email or web address] Date: 09/24/2019 Prepared by: Janann August  Exercises Seated Hamstring Stretch - 1 x daily - 5 x weekly - 3 sets - 20-30 hold

## 2019-09-24 NOTE — Therapy (Signed)
[facility] [address] [place], [place], [address] Phone: [phone]   Fax:  [phone]  Speech Language Pathology Treatment  Patient Details  Name: Justin Gomez MRN: [id number] Date of Birth: 1941/05/10 Referring Provider (SLP): Alonza Bogus, DO   Encounter Date: 09/24/2019  End of Session - 09/24/19 1638    Visit Number  2    Number of Visits  17    Date for SLP Re-Evaluation  11/25/19    SLP Start Time  60    SLP Stop Time   1615    SLP Time Calculation (min)  41 min    Activity Tolerance  Patient tolerated treatment well       Past Medical History:  Diagnosis Date  . BPH (benign prostatic hypertrophy)   . 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 Diverticulosis   . Essential hypertension 11/29/2015  . Gastroesophageal reflux disease 04/13/2016  . Hiatal hernia   . Hyperlipidemia   . Hypothyroidism   . Lumbar herniated disc    L4  . Mild neurocognitive disorder, unclear etiology 09/24/2019  . Multiple system atrophy   . Parkinson's disease 07/31/2017  . Postural dizziness with near syncope 07/14/2017  . Postural hypotension 01/22/2017  . 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 Type 2 diabetes mellitus without complication, without long-term current use of insulin ([place]) 11/29/2015    Past Surgical History:  Procedure Laterality Date  . APPENDECTOMY  1956  . KNEE SURGERY  2009   right  . SHOULDER SURGERY  2010   left    There were no vitals filed for this visit.  Subjective Assessment - 09/24/19 1538    Subjective  Pt states his coughing does not happen every meal but sometimes he does cough with applesauce with meds.    Currently in Pain?  No/denies            ADULT SLP TREATMENT - 09/24/19 1539      General Information   Behavior/Cognition  Alert;Cooperative;Pleasant mood      Treatment Provided   Treatment provided   Cognitive-Linquistic;Dysphagia      Dysphagia Treatment   Temperature Spikes Noted  No    Respiratory Status  Room air    Oral Cavity - Dentition  Adequate natural dentition    Treatment Methods  Therapeutic exercise;Compensation strategy training;Patient/caregiver education    Patient observed directly with PO's  Yes    Type of PO's observed  Dysphagia 1 (puree);Thin liquids    Liquids provided via  Cup    Oral Phase Signs & Symptoms  --   none noted   Pharyngeal Phase Signs & Symptoms  Complaints of residue   with the "med"   Other treatment/comments  Pt stated he was considering decr frequency to once/week and SLP strongly encouraged him to remain x2/week due to insufficient frequency. He is taking meds with applesauce - states this is going better than water but pt cont to rarely cough with applesauce. Today pt req'd usual min A for following precautions, and occasional min-mod A for HEP procedure. SLP stressed HEP completion x2/day due to insufficient frequency - SLP likened that to only coming in for ST x1/week - likely will not lead to desired progress.      Cognitive-Linquistic Treatment   Treatment focused on  --    Skilled Treatment  --      Assessment / Recommendations / Plan   Plan  Continue with  current plan of care      Dysphagia Recommendations   Diet recommendations  Regular;Thin liquid    Liquids provided via  Cup    Medication Administration  Whole meds with puree    Supervision  Patient able to self feed    Compensations  Slow rate;Small sips/bites;Multiple dry swallows after each bite/sip;Follow solids with liquid;Clear throat intermittently      Progression Toward Goals   Progression toward goals  Progressing toward goals       SLP Education - 09/24/19 1638    Education Details  HEP procedure, swallow precautions    Person(s) Educated  Patient    Methods  Explanation;Demonstration;Verbal cues    Comprehension  Verbalized understanding;Returned  demonstration;Verbal cues required;Need further instruction       SLP Short Term Goals - 09/24/19 1639      SLP SHORT TERM GOAL #1   Title  pt will follow swallow precautions with POs with rare min A over 3 sessions    Time  4    Period  Weeks    Status  On-going      SLP SHORT TERM GOAL #2   Title  pt will clear throat and reswallow when experiencing hydrophonia ("wet" voice) with min nonverbal cues in  sessions.    Time  4    Period  Weeks    Status  On-going      SLP SHORT TERM GOAL #3   Title  pt will complete HEP for swallowing deficits with rare min A in three sessions    Time  4    Period  Weeks    Status  On-going      SLP SHORT TERM GOAL #4   Title  pt will complete cognitve linguistic assessment in first 6 sessions, if clinicially indicated    Time  3    Period  Weeks    Status  On-going       SLP Long Term Goals - 09/24/19 1639      SLP LONG TERM GOAL #1   Title  pt will complete HEP for swallowing deficits with modified independence in three sessions    Time  8    Period  Weeks   or 17 sessions, for all LTGs   Status  On-going      SLP LONG TERM GOAL #2   Title  pt will demo awareness of hydrophonic voice by clearing throat/reswallowing independently in 3 sessions    Time  8    Period  Weeks    Status  On-going      SLP LONG TERM GOAL #3   Title  pt will follow swallow precautions with POs with modified independence in 4 sessions    Time  8    Period  Weeks    Status  On-going       Plan - 09/24/19 1638    Clinical Impression Statement  Pt presents primarily with dysphagia due to Parkison's disease - compensatory strategies and exercises were reviewed with pt today. Secondly, pt presents with cognitive communication deficits in at least memory and processing speed. Pt will likely require cognitive communication assessment at some point in the plan of care. Pt's volume, currently, is WNL (average low 70s dB) in 10 minuts mod complex conversation. Pt  would benefit from skilled ST to address pt's cognitive communication defiicts as well as his dysphagia.    Speech Therapy Frequency  2x / week    Duration  --  8 weeks, or 17 total sessions   Treatment/Interventions  Aspiration precaution training;Pharyngeal strengthening exercises;Diet toleration management by SLP;Trials of upgraded texture/liquids;Cueing hierarchy;Cognitive reorganization;Language facilitation;Internal/external aids;Patient/family education;SLP instruction and feedback;Compensatory strategies    Potential to Achieve Goals  Good    Potential Considerations  Ability to learn/carryover information       Patient will benefit from skilled therapeutic intervention in order to improve the following deficits and impairments:   Dysphagia, oropharyngeal phase  Cognitive communication deficit    Problem List Patient Active Problem List   Diagnosis Date Noted  . Mild neurocognitive disorder, unclear etiology 09/24/2019  . Parkinson's disease 07/31/2017  . 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 Fall   . Multiple system atrophy   . 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 Postural dizziness with near syncope 07/14/2017  . Postural hypotension 01/22/2017  . Gastroesophageal reflux disease 04/13/2016  . Cough 04/13/2016  . 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 Essential hypertension 11/29/2015  . Type 2 diabetes mellitus without complication, without long-term current use of insulin ([place]) 11/29/2015    [place] ,[place], [place]  09/24/2019, 4:40 PM  [facility] [address] [place], [place], [address] Phone: [phone]   Fax:  [phone]   Name: ODAS BLASKE MRN: [id number] Date of Birth: [birth year]

## 2019-09-24 NOTE — Therapy (Signed)
[facility] [address] [place], [place], [address] Phone: [phone]   Fax:  [phone]  Physical Therapy Treatment  Patient Details  Name: Justin Gomez MRN: [id number] Date of Birth: 1940-09-09 Referring Provider (PT): Alonza Bogus, DO   Encounter Date: 09/24/2019  PT End of Session - 09/24/19 2014    Visit Number  2    Number of Visits  9    Date for PT Re-Evaluation  10/26/19   written for 4 week POC   Authorization Type  [employer]    PT Start Time  1449    PT Stop Time  1530    PT Time Calculation (min)  41 min    Equipment Utilized During Treatment  Gait belt    Activity Tolerance  Patient tolerated treatment well    Behavior During Therapy  [facility] for tasks assessed/performed       Past Medical History:  Diagnosis Date  . BPH (benign prostatic hypertrophy)   . 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 Diverticulosis   . Essential hypertension 11/29/2015  . Gastroesophageal reflux disease 04/13/2016  . Hiatal hernia   . Hyperlipidemia   . Hypothyroidism   . Lumbar herniated disc    L4  . Mild neurocognitive disorder, unclear etiology 09/24/2019  . Multiple system atrophy   . Parkinson's disease 07/31/2017  . Postural dizziness with near syncope 07/14/2017  . Postural hypotension 01/22/2017  . 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 Type 2 diabetes mellitus without complication, without long-term current use of insulin ([place]) 11/29/2015    Past Surgical History:  Procedure Laterality Date  . APPENDECTOMY  1956  . KNEE SURGERY  2009   right  . SHOULDER SURGERY  2010   left    There were no vitals filed for this visit.  Subjective Assessment - 09/24/19 1451    Subjective  No falls. Balance feels about the same. Going back to the PD cycle.    Pertinent History  PMH: diabetes, HTN, HLD, hypothyroid, PD (diagonosed in 2019), hx of CVA (06/2017)  - small cva left parietal and left subdural hematoma 22mm in diameter.    Patient Stated Goals  wants to slow the progression of PD    Currently in Pain?  No/denies           Pt performs PWR! Moves in sitting position x 10 reps   PWR! Up for improved posture - performed slowly first for an increased hold and stretch, cues for intensity, posture, and BIG hands   PWR! Rock for improved weightshifting  - performed with wide BOS and rocking and holding for incr stretch/weight shift, pt initially reporting discomfort in R hip, but it decr with incr reps, did not add reaching up and looking at hand today. x10 reps both sides   Verbal and demonstrative cues provided for intensity, posture, technique. Added both to initial HEP. Educated on purpose of exercises in relation to PD      Access Code: [facility] URL: [email or web address] Date: 09/24/2019 Prepared by: Janann August  Exercises Seated Hamstring Stretch - 1 x daily - 5 x weekly - 3 sets - 20-30 hold - cues for technique and posture, pt reporting relief from stretch.           [facility] Adult PT Treatment/Exercise - 09/24/19 0001      Mini-BESTest   Sit To Stand  Normal: Comes to stand without use of hands and stabilizes  independently.    Rise to Toes  Moderate: Heels up, but not full range (smaller than when holding hands), OR noticeable instability for 3 s.    Stand on one leg (left)  Moderate: < 20 s    Stand on one leg (right)  Moderate: < 20 s    Stand on one leg - lowest score  1    Compensatory Stepping Correction - Forward  Moderate: More than one step is required to recover equilibrium    Compensatory Stepping Correction - Backward  No step, OR would fall if not caught, OR falls spontaneously.    Compensatory Stepping Correction - Left Lateral  Severe: Falls, or cannot step    Compensatory Stepping Correction - Right Lateral  Severe:  Falls, or cannot step    Stepping Corredtion Lateral - lowest score   0    Stance - Feet together, eyes open, firm surface   Normal: 30s    Stance - Feet together, eyes closed, foam surface   Moderate: < 30s    Incline - Eyes Closed  Moderate: Stands independently < 30s OR aligns with surface    Change in Gait Speed  Normal: Significantly changes walkling speed without imbalance    Walk with head turns - Horizontal  Moderate: performs head turns with reduction in gait speed.    Walk with pivot turns  Moderate:Turns with feet close SLOW (>4 steps) with good balance.    Step over obstacles  Moderate: Steps over box but touches box OR displays cautious behavior by slowing gait.    Timed UP & GO with Dual Task  Moderate: Dual Task affects either counting OR walking (>10%) when compared to the TUG without Dual Task.    Mini-BEST total score  15             PT Education - 09/24/19 2013    Education Details  initial HEP, results of miniBEST    Person(s) Educated  Patient    Methods  Explanation;Handout;Demonstration    Comprehension  Verbalized understanding;Returned demonstration;Need further instruction       PT Short Term Goals - 08/27/19 1409      PT SHORT TERM GOAL #1   Title  all STGs = LTGs        PT Long Term Goals - 09/24/19 2039      PT LONG TERM GOAL #1   Title  Pt will be independent with final HEP and community fitness program in order to build upon functional gains made in therapy. ALL LTGS DUE 10/22/19    Time  8   due to delay in scheduling - only written for 4 week POC   Period  Weeks    Status  New      PT LONG TERM GOAL #2   Title  Pt will improve mini BEST score to at least a 20/28 in order to demo decr fall risk.    Baseline  15/28    Status  Revised      PT LONG TERM GOAL #3   Title  Pt will improve gait speed to at least 3.6 ft/sec to demo improved gait efficiency.    Baseline  3.10 ft/sec    Status  New      PT LONG TERM GOAL #4   Title  Pt will improve TUG and TUG cognitive scores to </= 10% difference of each  other, for improved dual tasking with gait.    Baseline  TUG 9.63 seconds, cog  TUG 12.53 seconds    Status  New            Plan - 09/24/19 2040    Clinical Impression Statement  Performed the miniBEST today with pt scoring a 15/28 indicating that pt is at a high risk for falls. Pt with most difficulty with lateral stepping and posterior stepping strategy. With posterior stepping strategy, pt needed a couple steps to recover balance and needed min A to prevent a fall. Remainder of session focused on initiating seated PWR! moves and hamstring stretch for initial HEP. Pt needing initial verbal and demonstrative cues for proper technique for PWR! Up and weight shift. With gait in and out of clinic pt demos incr forward flexed posture, small step length B and decr arm swing. Will continue to progress towards LTGs.    Personal Factors and Comorbidities  Comorbidity 3+;Time since onset of injury/illness/exacerbation    Comorbidities  diabetes, HTN, HLD, hypothyroid, hx of CVA (2019)    Examination-Activity Limitations  Stairs;Locomotion Level    Examination-Participation Restrictions  Community Activity    Stability/Clinical Decision Making  Stable/Uncomplicated    Rehab Potential  Excellent    PT Frequency  2x / week    PT Duration  4 weeks    PT Treatment/Interventions  Gait training;Stair training;Therapeutic activities;Patient/family education;Neuromuscular re-education;Balance training;Therapeutic exercise    PT Next Visit Plan  how was HEP? review seated PWR moves, PWR up moves for posture, lateral/posterior stepping strategies    Consulted and Agree with Plan of Care  Patient       Patient will benefit from skilled therapeutic intervention in order to improve the following deficits and impairments:  Abnormal gait, Decreased activity tolerance, Decreased balance, Decreased range of motion, Difficulty walking, Postural dysfunction  Visit Diagnosis: Unsteadiness on feet  Other  abnormalities of gait and mobility  Abnormal posture     Problem List Patient Active Problem List   Diagnosis Date Noted  . Mild neurocognitive disorder, unclear etiology 09/24/2019  . Parkinson's disease 07/31/2017  . 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 Fall   . Multiple system atrophy   . 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 Postural dizziness with near syncope 07/14/2017  . Postural hypotension 01/22/2017  . Gastroesophageal reflux disease 04/13/2016  . Cough 04/13/2016  . 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 Essential hypertension 11/29/2015  . Type 2 diabetes mellitus without complication, without long-term current use of insulin ([place]) 11/29/2015    Arliss Journey, PT, DPT  09/24/2019, 8:43 PM  [facility] [address] [place], [place], [address] Phone: [phone]   Fax:  [phone]  Name: Justin Gomez MRN: [id number] Date of Birth: 1941/02/22

## 2019-09-26 ENCOUNTER — Ambulatory Visit: Payer: HMO

## 2019-09-26 ENCOUNTER — Ambulatory Visit: Payer: HMO | Admitting: Physical Therapy

## 2019-09-29 ENCOUNTER — Other Ambulatory Visit: Payer: Self-pay

## 2019-09-29 ENCOUNTER — Ambulatory Visit: Payer: HMO | Admitting: Physical Therapy

## 2019-09-29 ENCOUNTER — Ambulatory Visit: Payer: HMO

## 2019-09-29 VITALS — BP 118/58 | HR 72

## 2019-09-29 DIAGNOSIS — R41841 Cognitive communication deficit: Secondary | ICD-10-CM

## 2019-09-29 DIAGNOSIS — R1312 Dysphagia, oropharyngeal phase: Secondary | ICD-10-CM

## 2019-09-29 DIAGNOSIS — R2681 Unsteadiness on feet: Secondary | ICD-10-CM

## 2019-09-29 DIAGNOSIS — R293 Abnormal posture: Secondary | ICD-10-CM

## 2019-09-29 DIAGNOSIS — R2689 Other abnormalities of gait and mobility: Secondary | ICD-10-CM

## 2019-09-29 NOTE — Therapy (Signed)
[facility] [address] [place], [place], [address] Phone: [phone]   Fax:  [phone]  Speech Language Pathology Treatment  Patient Details  Name: Justin Gomez MRN: [id number] Date of Birth: [birth year] Referring Provider (SLP): Alonza Bogus, DO   Encounter Date: 09/29/2019  End of Session - 09/29/19 2353    Visit Number  3    Number of Visits  17    Date for SLP Re-Evaluation  11/25/19    SLP Start Time  1448    SLP Stop Time   1530    SLP Time Calculation (min)  42 min    Activity Tolerance  Patient tolerated treatment well       Past Medical History:  Diagnosis Date  . BPH (benign prostatic hypertrophy)   . 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 Diverticulosis   . Essential hypertension 11/29/2015  . Gastroesophageal reflux disease 04/13/2016  . Hiatal hernia   . Hyperlipidemia   . Hypothyroidism   . Lumbar herniated disc    L4  . Mild neurocognitive disorder, unclear etiology 09/24/2019  . Multiple system atrophy   . Parkinson's disease 07/31/2017  . Postural dizziness with near syncope 07/14/2017  . Postural hypotension 01/22/2017  . 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 Type 2 diabetes mellitus without complication, without long-term current use of insulin ([place]) 11/29/2015    Past Surgical History:  Procedure Laterality Date  . APPENDECTOMY  1956  . KNEE SURGERY  2009   right  . SHOULDER SURGERY  2010   left    There were no vitals filed for this visit.  Subjective Assessment - 09/29/19 1451    Subjective  "Exercises - you men when I am swallowing another time?" "Its only been 2 days since I was here last. (it's been 5 days)"            ADULT SLP TREATMENT - 09/29/19 1452      General Information   Behavior/Cognition  Alert;Cooperative;Pleasant mood      Treatment Provided   Treatment provided  Dysphagia      Dysphagia  Treatment   Temperature Spikes Noted  No    Respiratory Status  Room air    Oral Cavity - Dentition  Adequate natural dentition    Treatment Methods  Therapeutic exercise;Compensation strategy training;Patient/caregiver education    Patient observed directly with PO's  Yes    Type of PO's observed  Dysphagia 3 (soft);Thin liquids    Liquids provided via  Cup    Oral Phase Signs & Symptoms  Prolonged mastication    Pharyngeal Phase Signs & Symptoms  --   none noted   Type of cueing  Verbal;Visual    Amount of cueing  --   usual mod cues faded to rare min cues (precautions)   Other treatment/comments  Pt without hydrophonic voice today. SLP provided pt with overt s/sx aspiration PNA and pt told SLP 3 with modified independence. HEP - pt with initial cue for "exercises" vs "aspiration precautions" differences. Procedure was WNL. Pt has been noncopliant with frequency of HEP so SL Preiterated rationale for HEP. Pt return stated to SLP wihtout cues.       Assessment / Recommendations / Plan   Plan  Continue with current plan of care      Dysphagia Recommendations   Diet recommendations  Dysphagia 3 (mechanical soft);Thin liquid    Liquids provided via  Cup  Medication Administration  Whole meds with puree    Supervision  Patient able to self feed    Compensations  Slow rate;Small sips/bites;Multiple dry swallows after each bite/sip;Follow solids with liquid;Clear throat intermittently      Progression Toward Goals   Progression toward goals  Progressing toward goals       SLP Education - 09/29/19 2352    Education Details  rationale for HEP, swallow precautions    Person(s) Educated  Patient    Methods  Explanation;Demonstration;Verbal cues;Handout   visual cues   Comprehension  Verbalized understanding;Returned demonstration       SLP Short Term Goals - 09/29/19 1454      SLP SHORT TERM GOAL #1   Title  pt will follow swallow precautions with POs with rare min A over 3  sessions    Time  3    Period  Weeks    Status  On-going      SLP SHORT TERM GOAL #2   Title  pt will clear throat and reswallow when experiencing hydrophonia ("wet" voice) with min nonverbal cues in  sessions.    Time  3    Period  Weeks    Status  On-going      SLP SHORT TERM GOAL #3   Title  pt will complete HEP for swallowing deficits with rare min A in three sessions    Time  3    Period  Weeks    Status  On-going      SLP SHORT TERM GOAL #4   Title  pt will complete cognitve linguistic assessment in first 6 sessions, if clinicially indicated    Time  2    Period  Weeks    Status  On-going       SLP Long Term Goals - 09/29/19 2355      SLP LONG TERM GOAL #1   Title  pt will complete HEP for swallowing deficits with modified independence in three sessions    Time  7    Period  Weeks   or 17 sessions, for all LTGs   Status  On-going      SLP LONG TERM GOAL #2   Title  pt will demo awareness of hydrophonic voice by clearing throat/reswallowing independently in 3 sessions    Time  7    Period  Weeks    Status  On-going      SLP LONG TERM GOAL #3   Title  pt will follow swallow precautions with POs with modified independence in 4 sessions    Time  7    Period  Weeks    Status  On-going       Plan - 09/29/19 2354    Clinical Impression Statement  Pt presents primarily with dysphagia due to Parkison's disease - compensatory strategies were again extensively reviewed with pt today - SLP strongly suspects pt not adhering closely to precautions. Secondly, pt presents with cognitive communication deficits in at least memory and processing speed. Pt will likely require cognitive communication assessment at some point in the plan of care. Pt's volume, currently, is WNL (average low 70s dB) in simple to mod complex conversation. Pt would benefit from skilled ST to address pt's cognitive communication defiicts as well as his dysphagia.    Speech Therapy Frequency  2x / week     Duration  --   8 weeks, or 17 total sessions   Treatment/Interventions  Aspiration precaution training;Pharyngeal strengthening exercises;Diet toleration management by  SLP;Trials of upgraded texture/liquids;Cueing hierarchy;Cognitive reorganization;Language facilitation;Internal/external aids;Patient/family education;SLP instruction and feedback;Compensatory strategies    Potential to Achieve Goals  Good    Potential Considerations  Ability to learn/carryover information       Patient will benefit from skilled therapeutic intervention in order to improve the following deficits and impairments:   Dysphagia, oropharyngeal phase  Cognitive communication deficit    Problem List Patient Active Problem List   Diagnosis Date Noted  . Mild neurocognitive disorder, unclear etiology 09/24/2019  . Parkinson's disease 07/31/2017  . 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 Fall   . Multiple system atrophy   . 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 Postural dizziness with near syncope 07/14/2017  . Postural hypotension 01/22/2017  . Gastroesophageal reflux disease 04/13/2016  . Cough 04/13/2016  . 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 Essential hypertension 11/29/2015  . Type 2 diabetes mellitus without complication, without long-term current use of insulin ([place]) 11/29/2015    [place] ,[place], [place]  09/29/2019, 11:56 PM  [facility] [address] [place], [place], [address] Phone: [phone]   Fax:  [phone]   Name: Justin Gomez MRN: [id number] Date of Birth: [birth year]

## 2019-09-29 NOTE — Therapy (Signed)
[facility] [address] [place], [place], [address] Phone: [phone]   Fax:  [phone]  Physical Therapy Treatment  Patient Details  Name: Justin Gomez MRN: [id number] Date of Birth: 03-06-1941 Referring Provider (PT): Alonza Bogus, DO   Encounter Date: 09/29/2019  PT End of Session - 09/29/19 1636    Visit Number  3    Number of Visits  9    Date for PT Re-Evaluation  10/26/19   written for 4 week POC   Authorization Type  [employer]    PT Start Time  [id number]    PT Stop Time  1443    PT Time Calculation (min)  41 min    Equipment Utilized During Treatment  Gait belt    Activity Tolerance  Patient tolerated treatment well    Behavior During Therapy  [facility] for tasks assessed/performed       Past Medical History:  Diagnosis Date  . BPH (benign prostatic hypertrophy)   . 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 Diverticulosis   . Essential hypertension 11/29/2015  . Gastroesophageal reflux disease 04/13/2016  . Hiatal hernia   . Hyperlipidemia   . Hypothyroidism   . Lumbar herniated disc    L4  . Mild neurocognitive disorder, unclear etiology 09/24/2019  . Multiple system atrophy   . Parkinson's disease 07/31/2017  . Postural dizziness with near syncope 07/14/2017  . Postural hypotension 01/22/2017  . 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 Type 2 diabetes mellitus without complication, without long-term current use of insulin ([place]) 11/29/2015    Past Surgical History:  Procedure Laterality Date  . APPENDECTOMY  1956  . KNEE SURGERY  2009   right  . SHOULDER SURGERY  2010   left    Vitals:   09/29/19 1404  BP: (!) 118/58  Pulse: 72    Subjective Assessment - 09/29/19 1403    Subjective  Feeling a little more unsteady - thinks it might be from his diabetes medication. Just ate a sandwich. No falls. Hasn't had a chance to try the exercises.    Pertinent History  PMH: diabetes, HTN, HLD, hypothyroid, PD (diagonosed in 2019), hx of CVA (06/2017) - small cva left parietal and left subdural hematoma 45mm in diameter.    Patient Stated Goals  wants to slow the progression of PD    Currently in Pain?  No/denies              Pt performs PWR! Moves in sitting position    PWR! Up for improved posture - cues for wide hands x10 reps, reviewed as HEP   PWR! Rock for improved weighshifting -x5 reps each side, did not incorporate reaching and looking at UE, focused more of weight shift and trying to extend contralateral leg   PWR! Twist for improved trunk rotation -cues to turn and look towards hands as well as pivot legs for incr twist, x5 reps with clapping and x5 reps using boomwhackers for intensity while twisting   PWR! Step for improved step initiation x10 reps, use of black foam beams as visual cues for step height   Cues provided for intensity and demo cues for technique             [facility] Adult PT Treatment/Exercise - 09/29/19 1641      Ambulation/Gait   Ambulation/Gait  Yes    Ambulation/Gait Assistance  5: Supervision    Ambulation/Gait Assistance Details  cues  to "take as big of steps as you can" with pt able to demo more reciprocal arm swing while doing so    Ambulation Distance (Feet)  115 Feet    Assistive device  None    Gait Pattern  Step-through pattern;Decreased arm swing - left;Decreased arm swing - right;Left flexed knee in stance;Right flexed knee in stance;Decreased trunk rotation;Trunk flexed;Decreased stride length      Neuro Re-ed    Neuro Re-ed Details   standing at countertop with BUE support: x10 reps lateral stepping strategy B, x10 reps posterior stepping strategy B - cues for weight shift and for posture      Knee/Hip Exercises: Aerobic   Other Aerobic  SciFit with BLE and BUE: for ROM, strengthening, activity tolerance, and reciprocal motion x5 minutes at gear 2.5 - cues to maintain rpm of  75-80             PT Education - 09/29/19 1636    Education Details  reviewed sitting PWR! moves and added sitting PWR! twist and step initiation to HEP    Person(s) Educated  Patient    Methods  Explanation;Demonstration;Handout    Comprehension  Verbalized understanding;Returned demonstration;Need further instruction       PT Short Term Goals - 08/27/19 1409      PT SHORT TERM GOAL #1   Title  all STGs = LTGs        PT Long Term Goals - 09/24/19 2039      PT LONG TERM GOAL #1   Title  Pt will be independent with final HEP and community fitness program in order to build upon functional gains made in therapy. ALL LTGS DUE 10/22/19    Time  8   due to delay in scheduling - only written for 4 week POC   Period  Weeks    Status  New      PT LONG TERM GOAL #2   Title  Pt will improve mini BEST score to at least a 20/28 in order to demo decr fall risk.    Baseline  15/28    Status  Revised      PT LONG TERM GOAL #3   Title  Pt will improve gait speed to at least 3.6 ft/sec to demo improved gait efficiency.    Baseline  3.10 ft/sec    Status  New      PT LONG TERM GOAL #4   Title  Pt will improve TUG and TUG cognitive scores to </= 10% difference of each other, for improved dual tasking with gait.    Baseline  TUG 9.63 seconds, cog TUG 12.53 seconds    Status  New            Plan - 09/29/19 1637    Clinical Impression Statement  Reviewed pt's initial HEP of PWR! Up and PWR! rock in sitting and also added twist and step initiation in sitting, needed demonstrative cues for technique and verbal cues for intensity. With lateral stepping strategy work, pt needing cues for foot clearance, due to minimal scuffing. Pt responded well during gait with cues to "take as big as steps as you can" - with more reciprocal arm swing. Will continue to progress towards LTGs.    Personal Factors and Comorbidities  Comorbidity 3+;Time since onset of injury/illness/exacerbation     Comorbidities  diabetes, HTN, HLD, hypothyroid, hx of CVA (2019)    Examination-Activity Limitations  Stairs;Locomotion Level    Examination-Participation Restrictions  Community Activity  Stability/Clinical Decision Making  Stable/Uncomplicated    Rehab Potential  Excellent    PT Frequency  2x / week    PT Duration  4 weeks    PT Treatment/Interventions  Gait training;Stair training;Therapeutic activities;Patient/family education;Neuromuscular re-education;Balance training;Therapeutic exercise    PT Next Visit Plan  how is HEP? PWR up moves for posture, lateral/posterior stepping strategies, gait training with walking poles    Consulted and Agree with Plan of Care  Patient       Patient will benefit from skilled therapeutic intervention in order to improve the following deficits and impairments:  Abnormal gait, Decreased activity tolerance, Decreased balance, Decreased range of motion, Difficulty walking, Postural dysfunction  Visit Diagnosis: Other abnormalities of gait and mobility  Unsteadiness on feet  Abnormal posture     Problem List Patient Active Problem List   Diagnosis Date Noted  . Mild neurocognitive disorder, unclear etiology 09/24/2019  . Parkinson's disease 07/31/2017  . 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 Fall   . Multiple system atrophy   . 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 Postural dizziness with near syncope 07/14/2017  . Postural hypotension 01/22/2017  . Gastroesophageal reflux disease 04/13/2016  . Cough 04/13/2016  . 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 Essential hypertension 11/29/2015  . Type 2 diabetes mellitus without complication, without long-term current use of insulin ([place]) 11/29/2015    Arliss Journey, PT, DPT  09/29/2019, 4:43 PM  [facility] [address] [place], [place], [address] Phone: [phone]   Fax:   [phone]  Name: BILLAL MINGE MRN: [id number] Date of Birth: 27-Aug-1940

## 2019-09-29 NOTE — Patient Instructions (Signed)
Signs of Aspiration Pneumonia   . Chest pain/tightness . Fever (can be low grade) . Cough  o With foul-smelling phlegm (sputum) o With sputum containing pus or blood o With greenish sputum . Fatigue  . Shortness of breath  . Wheezing   **IF YOU HAVE THESE SIGNS, CONTACT YOUR DOCTOR OR GO TO THE EMERGENCY DEPARTMENT OR URGENT CARE AS SOON AS POSSIBLE**      

## 2019-09-30 ENCOUNTER — Ambulatory Visit (INDEPENDENT_AMBULATORY_CARE_PROVIDER_SITE_OTHER): Payer: HMO | Admitting: Psychology

## 2019-09-30 DIAGNOSIS — G3184 Mild cognitive impairment, so stated: Secondary | ICD-10-CM | POA: Diagnosis not present

## 2019-09-30 NOTE — Progress Notes (Signed)
   Neuropsychology Feedback Session Tillie Rung. [facility] Department of Neurology  Reason for Referral:   Justin Gomez a 79 y.o. right-handed Caucasian male referred by Alonza Bogus, D.O.,to characterize hiscurrent cognitive functioning and assist with diagnostic clarity and treatment planning in the context of subjective cognitive decline and a history of Parkinson's disease.  Feedback:   Justin Gomez completed a comprehensive neuropsychological evaluation on 09/23/2019. Please refer to that encounter for the full report and recommendations. Briefly, the current evaluation should be interpreted with caution and lower performances may have been artificially lowered due to testing variables including variable persistence and fatigue. If taken at face value, Justin Gomez pattern of performance is suggestive of primary impairments surrounding verbal fluency, receptive language, and learning and memory, with additional relative weaknesses across processing speed, complex attention (i.e., working memory), and visuoconstructional abilities (i.e., clock drawing). The etiology for his weaknesses is somewhat unclear. Individuals with Parkinson's disease commonly have accompanying cognitive dysfunction and this could certainly represent the primary culprit. However, his pattern of performance is not fully consistent with what is generally expected with Parkinson's disease, potentially impacted by varying test engagement and fatigue limiting the comprehensiveness of the current evaluation. 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 Namely, he reported gait abnormalities (including a wide-based ataxic gait), increased urinary urgency and instances of incontinence, and ongoing cognitive dysfunction. Prior brain MRI showing ventriculomegaly and his wife's report of cognitive dysfunction being present over  the past few months rather than years is further consistent with this presentation.  Justin Gomez was unaccompanied to the current telephone call. Content of the current session focused on the results of his neuropsychological evaluation. Justin Gomez was given the opportunity to ask questions and his questions were answered. He was encouraged to reach out should additional questions arise. A copy of his report was mailed at the conclusion of the visit.      18 minutes were spent conducting the current feedback session with Justin Gomez, billed as one unit [phone].

## 2019-10-03 ENCOUNTER — Ambulatory Visit: Payer: HMO

## 2019-10-03 ENCOUNTER — Ambulatory Visit: Payer: HMO | Admitting: Physical Therapy

## 2019-10-03 ENCOUNTER — Other Ambulatory Visit: Payer: Self-pay

## 2019-10-03 DIAGNOSIS — R293 Abnormal posture: Secondary | ICD-10-CM

## 2019-10-03 DIAGNOSIS — R41841 Cognitive communication deficit: Secondary | ICD-10-CM

## 2019-10-03 DIAGNOSIS — R2681 Unsteadiness on feet: Secondary | ICD-10-CM | POA: Diagnosis not present

## 2019-10-03 DIAGNOSIS — R2689 Other abnormalities of gait and mobility: Secondary | ICD-10-CM

## 2019-10-03 DIAGNOSIS — R1312 Dysphagia, oropharyngeal phase: Secondary | ICD-10-CM

## 2019-10-03 NOTE — Therapy (Signed)
[facility] [address] [place], [place], [address] Phone: [phone]   Fax:  [phone]  Physical Therapy Treatment  Patient Details  Name: Justin Gomez MRN: [id number] Date of Birth: 04-29-1941 Referring Provider (PT): Alonza Bogus, DO   Encounter Date: 10/03/2019  PT End of Session - 10/03/19 1824    Visit Number  4    Number of Visits  9    Date for PT Re-Evaluation  10/26/19   written for 4 week POC   Authorization Type  [employer]    PT Start Time  1401    PT Stop Time  1443    PT Time Calculation (min)  42 min    Activity Tolerance  Patient tolerated treatment well    Behavior During Therapy  [facility] for tasks assessed/performed       Past Medical History:  Diagnosis Date  . BPH (benign prostatic hypertrophy)   . 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 Diverticulosis   . Essential hypertension 11/29/2015  . Gastroesophageal reflux disease 04/13/2016  . Hiatal hernia   . Hyperlipidemia   . Hypothyroidism   . Lumbar herniated disc    L4  . Mild neurocognitive disorder, unclear etiology 09/24/2019  . Multiple system atrophy   . Parkinson's disease 07/31/2017  . Postural dizziness with near syncope 07/14/2017  . Postural hypotension 01/22/2017  . 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 Type 2 diabetes mellitus without complication, without long-term current use of insulin ([place]) 11/29/2015    Past Surgical History:  Procedure Laterality Date  . APPENDECTOMY  1956  . KNEE SURGERY  2009   right  . SHOULDER SURGERY  2010   left    There were no vitals filed for this visit.  Subjective Assessment - 10/03/19 1404    Subjective  Doing well, felt good after last session. Exercises are going well at home. No falls.    Pertinent History  PMH: diabetes, HTN, HLD, hypothyroid, PD (diagonosed in 2019), hx of CVA (06/2017) - small cva left parietal and  left subdural hematoma 13mm in diameter.    Patient Stated Goals  wants to slow the progression of PD    Currently in Pain?  No/denies                       [facility] Adult PT Treatment/Exercise - 10/03/19 1409      Ambulation/Gait   Ambulation/Gait  Yes    Ambulation/Gait Assistance  5: Supervision;4: Min guard    Ambulation/Gait Assistance Details  x2 laps with walking poles with therapist helping facilitate reciprocal arm swing, pt able to maintain gait with arm swing throughout remainder of session after removing walking poles      Ambulation Distance (Feet)  460 Feet    Assistive device  None   walking poles x2 laps   Gait Pattern  Step-through pattern;Decreased arm swing - left;Decreased arm swing - right;Left flexed knee in stance;Right flexed knee in stance;Decreased trunk rotation;Trunk flexed;Decreased stride length      Neuro Re-ed    Neuro Re-ed Details   At countertop on red mat for compliant surface: x10 reps lateral stepping strategy over black foam beam B for foot clearance, x10 reps B forward stepping strategy over black beam, cues for weight shift, pt w/ incr difficulty stepping over anteriorly       Knee/Hip Exercises: Aerobic   Other Aerobic  SciFit with BLE  and BUE: for ROM, strengthening, activity tolerance, and reciprocal motion x5 minutes at gear 3.0 - cues to maintain rpm of 75-80         Pt performs PWR! Moves in standing position x 10 reps   PWR! Up for improved posture - at edge of mat table with chair anteriorly, verbal and demonstrative cues for proper technique and intensity   PWR! Rock for improved weighshifting - at [place] with single UE support, cues for weight shift and visual cue of reaching up towards sticky note, needing cues to slow down at times and to re-set with tall posture   PWR! Twist for improved trunk rotation - performed in corner with chair anteriorly, cues for wide BOS and reaching across body to tap wall and to pivot at  foot, cues to re-set with tall posture in middle before twisting   PWR! Step for improved step initiation - at counter with single UE support, cues to look at hand and cues for intensity for stepping          PT Education - 10/03/19 1824    Education Details  continue with HEP    Person(s) Educated  Patient    Methods  Explanation    Comprehension  Verbalized understanding       PT Short Term Goals - 08/27/19 1409      PT SHORT TERM GOAL #1   Title  all STGs = LTGs        PT Long Term Goals - 09/24/19 2039      PT LONG TERM GOAL #1   Title  Pt will be independent with final HEP and community fitness program in order to build upon functional gains made in therapy. ALL LTGS DUE 10/22/19    Time  8   due to delay in scheduling - only written for 4 week POC   Period  Weeks    Status  New      PT LONG TERM GOAL #2   Title  Pt will improve mini BEST score to at least a 20/28 in order to demo decr fall risk.    Baseline  15/28    Status  Revised      PT LONG TERM GOAL #3   Title  Pt will improve gait speed to at least 3.6 ft/sec to demo improved gait efficiency.    Baseline  3.10 ft/sec    Status  New      PT LONG TERM GOAL #4   Title  Pt will improve TUG and TUG cognitive scores to </= 10% difference of each other, for improved dual tasking with gait.    Baseline  TUG 9.63 seconds, cog TUG 12.53 seconds    Status  New            Plan - 10/03/19 1833    Clinical Impression Statement  Focus of today's skilled session was gait training with walking poles, standing PWR moves, and stepping strategies. Pt with improved reciprocal arm swing throughout remainder of session after therapist faciliating with pt taking larger steps. Needed cues for posture throughout standing PWR! moves today. Will continue to progress towards LTGs.    Personal Factors and Comorbidities  Comorbidity 3+;Time since onset of injury/illness/exacerbation    Comorbidities  diabetes, HTN, HLD,  hypothyroid, hx of CVA (2019)    Examination-Activity Limitations  Stairs;Locomotion Level    Examination-Participation Restrictions  Community Activity    Stability/Clinical Decision Making  Stable/Uncomplicated    Rehab Potential  Excellent    PT Frequency  2x / week    PT Duration  4 weeks    PT Treatment/Interventions  Gait training;Stair training;Therapeutic activities;Patient/family education;Neuromuscular re-education;Balance training;Therapeutic exercise    PT Next Visit Plan  PWR up moves for posture - continue standing and maybe try quadraped?, lateral/posterior stepping strategies, standing weight shifting, stepping over obstacles    Consulted and Agree with Plan of Care  Patient       Patient will benefit from skilled therapeutic intervention in order to improve the following deficits and impairments:  Abnormal gait, Decreased activity tolerance, Decreased balance, Decreased range of motion, Difficulty walking, Postural dysfunction  Visit Diagnosis: Other abnormalities of gait and mobility  Unsteadiness on feet  Abnormal posture     Problem List Patient Active Problem List   Diagnosis Date Noted  . Mild neurocognitive disorder, unclear etiology 09/24/2019  . Parkinson's disease 07/31/2017  . 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 Fall   . Multiple system atrophy   . 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 Postural dizziness with near syncope 07/14/2017  . Postural hypotension 01/22/2017  . Gastroesophageal reflux disease 04/13/2016  . Cough 04/13/2016  . 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 Essential hypertension 11/29/2015  . Type 2 diabetes mellitus without complication, without long-term current use of insulin ([place]) 11/29/2015    Arliss Journey, PT, DPT  10/03/2019, 6:36 PM  [facility] [address] [place], [place], [address] Phone:  [phone]   Fax:  [phone]  Name: Justin Gomez MRN: [id number] Date of Birth: 12-06-1940

## 2019-10-03 NOTE — Therapy (Signed)
[facility] [address] [place], [place], [address] Phone: [phone]   Fax:  [phone]  Speech Language Pathology Treatment  Patient Details  Name: Justin Gomez MRN: [id number] Date of Birth: 10/13/1940 Referring Provider (SLP): Alonza Bogus, DO   Encounter Date: 10/03/2019  End of Session - 10/03/19 1556    Visit Number  4    Number of Visits  17    Date for SLP Re-Evaluation  11/25/19    SLP Start Time  [id number]    SLP Stop Time   [id number]    SLP Time Calculation (min)  36 min       Past Medical History:  Diagnosis Date  . BPH (benign prostatic hypertrophy)   . 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 Diverticulosis   . Essential hypertension 11/29/2015  . Gastroesophageal reflux disease 04/13/2016  . Hiatal hernia   . Hyperlipidemia   . Hypothyroidism   . Lumbar herniated disc    L4  . Mild neurocognitive disorder, unclear etiology 09/24/2019  . Multiple system atrophy   . Parkinson's disease 07/31/2017  . Postural dizziness with near syncope 07/14/2017  . Postural hypotension 01/22/2017  . 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 Type 2 diabetes mellitus without complication, without long-term current use of insulin ([place]) 11/29/2015    Past Surgical History:  Procedure Laterality Date  . APPENDECTOMY  1956  . KNEE SURGERY  2009   right  . SHOULDER SURGERY  2010   left    There were no vitals filed for this visit.  Subjective Assessment - 10/03/19 1449    Subjective  "I've been doing the exercises." (pt with suboptimal frequency/day)    Currently in Pain?  No/denies            ADULT SLP TREATMENT - 10/03/19 1450      General Information   Behavior/Cognition  Alert;Cooperative;Pleasant mood      Treatment Provided   Treatment provided  Dysphagia      Dysphagia Treatment   Temperature Spikes Noted  No    Respiratory Status  Room air    Oral Cavity - Dentition  Adequate natural dentition    Treatment Methods  Skilled observation;Patient/caregiver education    Patient observed directly with PO's  Yes    Type of PO's observed  Regular;Thin liquids    Liquids provided via  Cup    Pharyngeal Phase Signs & Symptoms  Complaints of residue   "right in the middle" - SLP was going to have pt turn head   Type of cueing  Verbal    Other treatment/comments  "What do I do (when I eat)? (5 seconds) This is like a memory test." SLP had pt begin to snack without telling SLP what he needed to do, to faciliate pt memory. Req'd rare min A initially - faded to independence. HEP completed with modified independence and fatigue noted after rep 6 of effortful, rep 9 of lingual press, and rep 6 of mendelsohn. SLP reiterated 30-40 reps each day were needed.       Assessment / Recommendations / Plan   Plan  Continue with current plan of care      Dysphagia Recommendations   Diet recommendations  Regular;Thin liquid    Liquids provided via  Cup    Medication Administration  Whole meds with puree    Supervision  Patient able to self feed    Compensations  Slow rate;Small  sips/bites;Multiple dry swallows after each bite/sip;Follow solids with liquid;Clear throat intermittently      Progression Toward Goals   Progression toward goals  Progressing toward goals       SLP Education - 10/03/19 1555    Education Details  need 30-40 reps of each swallow exercise    Person(s) Educated  Patient    Methods  Explanation    Comprehension  Verbalized understanding       SLP Short Term Goals - 10/03/19 1603      SLP SHORT TERM GOAL #1   Title  pt will follow swallow precautions with POs with rare min A over 3 sessions    Baseline  10-03-19    Time  3    Period  Weeks    Status  On-going      SLP SHORT TERM GOAL #2   Title  pt will clear throat and reswallow when experiencing hydrophonia ("wet" voice) with min nonverbal cues in  sessions.    Time  3     Period  Weeks    Status  On-going      SLP SHORT TERM GOAL #3   Title  pt will complete HEP for swallowing deficits with rare min A in three sessions    Baseline  10-03-19    Time  3    Period  Weeks    Status  On-going      SLP SHORT TERM GOAL #4   Title  pt will complete cognitve linguistic assessment in first 6 sessions, if clinicially indicated    Time  1    Period  Weeks    Status  On-going       SLP Long Term Goals - 10/03/19 1603      SLP LONG TERM GOAL #1   Title  pt will complete HEP for swallowing deficits with modified independence in three sessions    Baseline  10-03-19    Time  7    Period  Weeks   or 17 sessions, for all LTGs   Status  On-going      SLP LONG TERM GOAL #2   Title  pt will demo awareness of hydrophonic voice by clearing throat/reswallowing independently in 3 sessions    Time  7    Period  Weeks    Status  On-going      SLP LONG TERM GOAL #3   Title  pt will follow swallow precautions with POs with modified independence in 4 sessions    Time  7    Period  Weeks    Status  On-going       Plan - 10/03/19 1556    Clinical Impression Statement  Pt presents primarily with dysphagia due to Parkison's disease - compensatory strategies were again reviewed with pt today - SLP suspects pt is becoming better with adhering to precautions. Secondly, pt presents with cognitive communication deficits in at least memory and processing speed. Pt will likely require cognitive communication assessment at some point in the plan of care. Pt's volume, currently, is WNL (average low 70s dB) in simple to mod complex conversation. Pt would benefit from skilled ST to address pt's cognitive communication defiicts as well as his dysphagia.    Speech Therapy Frequency  2x / week    Duration  --   8 weeks, or 17 total sessions   Treatment/Interventions  Aspiration precaution training;Pharyngeal strengthening exercises;Diet toleration management by SLP;Trials of upgraded  texture/liquids;Cueing hierarchy;Cognitive reorganization;Language facilitation;Internal/external aids;Patient/family education;SLP  instruction and feedback;Compensatory strategies    Potential to Achieve Goals  Good    Potential Considerations  Ability to learn/carryover information       Patient will benefit from skilled therapeutic intervention in order to improve the following deficits and impairments:   Dysphagia, oropharyngeal phase  Cognitive communication deficit    Problem List Patient Active Problem List   Diagnosis Date Noted  . Mild neurocognitive disorder, unclear etiology 09/24/2019  . Parkinson's disease 07/31/2017  . 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 Fall   . Multiple system atrophy   . 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 Postural dizziness with near syncope 07/14/2017  . Postural hypotension 01/22/2017  . Gastroesophageal reflux disease 04/13/2016  . Cough 04/13/2016  . 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 Essential hypertension 11/29/2015  . Type 2 diabetes mellitus without complication, without long-term current use of insulin ([place]) 11/29/2015    Kendre Jacinto ,MS, [place]  10/03/2019, 4:05 PM  [facility] [address] [place], [place], [address] Phone: [phone]   Fax:  [phone]   Name: XZAVEON WEESE MRN: [id number] Date of Birth: Jan 09, 1941

## 2019-10-07 ENCOUNTER — Ambulatory Visit: Payer: HMO | Admitting: Physical Therapy

## 2019-10-07 ENCOUNTER — Other Ambulatory Visit: Payer: Self-pay

## 2019-10-07 ENCOUNTER — Ambulatory Visit: Payer: HMO

## 2019-10-07 DIAGNOSIS — R293 Abnormal posture: Secondary | ICD-10-CM

## 2019-10-07 DIAGNOSIS — R2689 Other abnormalities of gait and mobility: Secondary | ICD-10-CM

## 2019-10-07 DIAGNOSIS — R41841 Cognitive communication deficit: Secondary | ICD-10-CM

## 2019-10-07 DIAGNOSIS — R2681 Unsteadiness on feet: Secondary | ICD-10-CM

## 2019-10-07 DIAGNOSIS — R1312 Dysphagia, oropharyngeal phase: Secondary | ICD-10-CM

## 2019-10-07 NOTE — Patient Instructions (Signed)
Access Code: [facility] URL: [email or web address] Date: 10/07/2019 Prepared by: Janann August  Exercises Seated Hamstring Stretch - 1 x daily - 5 x weekly - 3 sets - 20-30 hold Alternating Backward Step - 1 x daily - 5 x weekly - 2 sets - 10 reps Side Stepping with Counter Support - 1 x daily - 5 x weekly - 2 sets - 10 reps   Plus seated PWR moves!

## 2019-10-07 NOTE — Therapy (Signed)
[facility] [address] [place], [place], [address] Phone: [phone]   Fax:  [phone]  Speech Language Pathology Treatment  Patient Details  Name: Justin Gomez MRN: [id number] Date of Birth: [birth year] Referring Provider (SLP): Alonza Bogus, DO   Encounter Date: 10/07/2019  End of Session - 10/07/19 2334    Visit Number  5    Number of Visits  17    Date for SLP Re-Evaluation  11/25/19    SLP Start Time  1450    SLP Stop Time   1530    SLP Time Calculation (min)  40 min    Activity Tolerance  Patient tolerated treatment well       Past Medical History:  Diagnosis Date  . BPH (benign prostatic hypertrophy)   . 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 Diverticulosis   . Essential hypertension 11/29/2015  . Gastroesophageal reflux disease 04/13/2016  . Hiatal hernia   . Hyperlipidemia   . Hypothyroidism   . Lumbar herniated disc    L4  . Mild neurocognitive disorder, unclear etiology 09/24/2019  . Multiple system atrophy   . Parkinson's disease 07/31/2017  . Postural dizziness with near syncope 07/14/2017  . Postural hypotension 01/22/2017  . 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 Type 2 diabetes mellitus without complication, without long-term current use of insulin ([place]) 11/29/2015    Past Surgical History:  Procedure Laterality Date  . APPENDECTOMY  1956  . KNEE SURGERY  2009   right  . SHOULDER SURGERY  2010   left    There were no vitals filed for this visit.  Subjective Assessment - 10/07/19 1455    Subjective  "This might be the last appointment." (next Friday is last ST session)    Currently in Pain?  No/denies            ADULT SLP TREATMENT - 10/07/19 1456      General Information   Behavior/Cognition  Alert;Cooperative;Pleasant mood      Treatment Provided   Treatment provided  Dysphagia      Dysphagia Treatment   Temperature Spikes Noted  No    Respiratory Status  Room air    Oral Cavity - Dentition  Adequate natural dentition    Treatment Methods  Skilled observation;Patient/caregiver education    Patient observed directly with PO's  Yes    Type of PO's observed  Regular;Thin liquids    Liquids provided via  Cup    Pharyngeal Phase Signs & Symptoms  Complaints of residue   x1 bolus of 3 solid boluses   Type of cueing  Verbal    Other treatment/comments  "Not as much as you said." Pt states he completes maybe 2/3 of the HEP - "It's probably mostly my laziness why I'm not getting it all in." SLP told pt he needs to pair it with something he does daily. SLP informed pt that he will know he is having success because he will not be as fatigued as early - it will take more reps before he is tired. Pt demonstrated confusion over the total number of reps/day as he thought it was 10 each       Assessment / Recommendations / Plan   Plan  Continue with current plan of care      Dysphagia Recommendations   Diet recommendations  Regular;Thin liquid    Liquids provided via  Cup    Medication Administration  Whole  meds with puree    Supervision  Patient able to self feed    Compensations  Slow rate;Small sips/bites;Multiple dry swallows after each bite/sip;Follow solids with liquid;Clear throat intermittently      Progression Toward Goals   Progression toward goals  Progressing toward goals       SLP Education - 10/07/19 2332    Education Details  30-40 reps is the target range for rep number/day    Person(s) Educated  Patient    Methods  Explanation    Comprehension  Verbalized understanding;Need further instruction       SLP Short Term Goals - 10/07/19 2336      SLP SHORT TERM GOAL #1   Title  pt will follow swallow precautions with POs with rare min A over 3 sessions    Baseline  10-03-19, 10-07-19    Time  2    Period  Weeks    Status  On-going      SLP SHORT TERM GOAL #2   Title  pt will  clear throat and reswallow when experiencing hydrophonia ("wet" voice) with min nonverbal cues in  sessions.    Status  Deferred   no hydrophonia     SLP SHORT TERM GOAL #3   Title  pt will complete HEP for swallowing deficits with rare min A in three sessions    Baseline  10-03-19, 10-07-19    Time  2    Period  Weeks    Status  On-going      SLP SHORT TERM GOAL #4   Title  pt will complete cognitve linguistic assessment in first 6 sessions, if clinicially indicated    Status  Deferred   pt desires d/c      SLP Long Term Goals - 10/07/19 2337      SLP LONG TERM GOAL #1   Title  pt will complete HEP for swallowing deficits with modified independence in three sessions    Baseline  10-03-19, 10-07-19    Time  6    Period  Weeks   or 17 sessions, for all LTGs   Status  On-going      SLP LONG TERM GOAL #2   Title  pt will demo awareness of hydrophonic voice by clearing throat/reswallowing independently in 3 sessions    Status  Deferred      SLP LONG TERM GOAL #3   Title  pt will follow swallow precautions with POs with modified independence in 4 sessions    Baseline  10-07-19    Time  6    Period  Weeks    Status  On-going       Plan - 10/07/19 2335    Clinical Impression Statement  Pt presents primarily with dysphagia due to Parkison's disease - compensatory strategies were again reviewed with pt today - SLP suspects pt is becoming better with adhering to precautions. Secondly, pt presents with cognitive communication deficits in at least memory and processing speed. Pt will likely require cognitive communication assessment at some point in the plan of care. Pt's volume, currently, is WNL (average low 70s dB) in simple to mod complex conversation. Pt would benefit from skilled ST to address pt's cognitive communication defiicts as well as his dysphagia.    Speech Therapy Frequency  2x / week    Duration  --   8 weeks, or 17 total sessions   Treatment/Interventions  Aspiration  precaution training;Pharyngeal strengthening exercises;Diet toleration management by SLP;Trials of upgraded texture/liquids;Cueing hierarchy;Cognitive  reorganization;Language facilitation;Internal/external aids;Patient/family education;SLP instruction and feedback;Compensatory strategies    Potential to Achieve Goals  Good    Potential Considerations  Ability to learn/carryover information       Patient will benefit from skilled therapeutic intervention in order to improve the following deficits and impairments:   Dysphagia, oropharyngeal phase  Cognitive communication deficit    Problem List Patient Active Problem List   Diagnosis Date Noted  . Mild neurocognitive disorder, unclear etiology 09/24/2019  . Parkinson's disease 07/31/2017  . 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 Fall   . Multiple system atrophy   . 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 Postural dizziness with near syncope 07/14/2017  . Postural hypotension 01/22/2017  . Gastroesophageal reflux disease 04/13/2016  . Cough 04/13/2016  . 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 Essential hypertension 11/29/2015  . Type 2 diabetes mellitus without complication, without long-term current use of insulin ([place]) 11/29/2015    [place] ,[place], [place]  10/07/2019, 11:38 PM  [facility] [address] [place], [place], [address] Phone: [phone]   Fax:  [phone]   Name: KANE FETTERHOFF MRN: [id number] Date of Birth: 1940-07-28

## 2019-10-07 NOTE — Therapy (Signed)
[facility] [address] [place], [place], [address] Phone: [phone]   Fax:  [phone]  Physical Therapy Treatment  Patient Details  Name: Justin Gomez MRN: [id number] Date of Birth: 1941/02/16 Referring Provider (PT): Alonza Bogus, DO   Encounter Date: 10/07/2019  PT End of Session - 10/07/19 1715    Visit Number  5    Number of Visits  9    Date for PT Re-Evaluation  10/26/19   written for 4 week POC   Authorization Type  [employer]    PT Start Time  1532    PT Stop Time  1613    PT Time Calculation (min)  41 min    Equipment Utilized During Treatment  Gait belt    Activity Tolerance  Patient tolerated treatment well    Behavior During Therapy  [facility] for tasks assessed/performed       Past Medical History:  Diagnosis Date  . BPH (benign prostatic hypertrophy)   . 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 Diverticulosis   . Essential hypertension 11/29/2015  . Gastroesophageal reflux disease 04/13/2016  . Hiatal hernia   . Hyperlipidemia   . Hypothyroidism   . Lumbar herniated disc    L4  . Mild neurocognitive disorder, unclear etiology 09/24/2019  . Multiple system atrophy   . Parkinson's disease 07/31/2017  . Postural dizziness with near syncope 07/14/2017  . Postural hypotension 01/22/2017  . 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 Type 2 diabetes mellitus without complication, without long-term current use of insulin ([place]) 11/29/2015    Past Surgical History:  Procedure Laterality Date  . APPENDECTOMY  1956  . KNEE SURGERY  2009   right  . SHOULDER SURGERY  2010   left    There were no vitals filed for this visit.  Subjective Assessment - 10/07/19 1538    Subjective  Doing good, has done the exercises every other day. No falls.    Pertinent History  PMH: diabetes, HTN, HLD, hypothyroid, PD (diagonosed in 2019), hx of CVA (06/2017) -  small cva left parietal and left subdural hematoma 65mm in diameter.    Patient Stated Goals  wants to slow the progression of PD    Currently in Pain?  No/denies                       [facility] Adult PT Treatment/Exercise - 10/07/19 0001      Ambulation/Gait   Ambulation/Gait  Yes    Ambulation/Gait Assistance  5: Supervision    Ambulation/Gait Assistance Details  throughout session - cues for step length and posture throughout    Ambulation Distance (Feet)  115 Feet    Assistive device  None    Gait Pattern  Step-through pattern;Decreased arm swing - left;Decreased arm swing - right;Left flexed knee in stance;Right flexed knee in stance;Decreased trunk rotation;Trunk flexed;Decreased stride length        [facility]) - 10/07/19 1720    PWR! Up  X10 reps    demonstrative cues for intensity and posture      Balance Exercises - 10/07/19 1720      Balance Exercises: Standing   Standing Eyes Closed  Foam/compliant surface;3 reps;20 secs   feet hip width distance on regular square foam   Wall Bumps  Hip;Eyes opened;10 reps;Limitations    Wall Bumps Limitations  x10 reps on level ground, progressing to standing on foam beam with  wide BOS -  cues for posture and "sticking bellybutton forwards" x10 reps with no UE support - arms outstretched forwards    Stepping Strategy  Posterior;Lateral;Limitations    Stepping Strategy Limitations  x10 reps posterior at countertop - added to HEP, 1 x 10 reps lateral stepping strategy at countert with UE support, cues for posture and weight shifting, performing an additional 10 reps with black foam beams on floor laterally for pt to step over for improved foot clearance B    Other Standing Exercises  standing on foam beam with wide BOS: eyes open 2 x 10 reps head turns (pt with tendency to also turn body as well), eyes closed balance 4 x 10 seconds with min A as pt with tendency to lose balance posteriorly        PT Education - 10/07/19 1714     Education Details  discussed POC going forwards with pt - adding additional 1x week for 4 weeks through month of May (pt will have to talk to wife first regarding scheduling), stepping strategy additions to HEP at [employer]) Educated  Patient    Methods  Explanation;Demonstration;Handout    Comprehension  Verbalized understanding;Returned demonstration;Need further instruction       PT Short Term Goals - 08/27/19 1409      PT SHORT TERM GOAL #1   Title  all STGs = LTGs        PT Long Term Goals - 09/24/19 2039      PT LONG TERM GOAL #1   Title  Pt will be independent with final HEP and community fitness program in order to build upon functional gains made in therapy. ALL LTGS DUE 10/22/19    Time  8   due to delay in scheduling - only written for 4 week POC   Period  Weeks    Status  New      PT LONG TERM GOAL #2   Title  Pt will improve mini BEST score to at least a 20/28 in order to demo decr fall risk.    Baseline  15/28    Status  Revised      PT LONG TERM GOAL #3   Title  Pt will improve gait speed to at least 3.6 ft/sec to demo improved gait efficiency.    Baseline  3.10 ft/sec    Status  New      PT LONG TERM GOAL #4   Title  Pt will improve TUG and TUG cognitive scores to </= 10% difference of each other, for improved dual tasking with gait.    Baseline  TUG 9.63 seconds, cog TUG 12.53 seconds    Status  New            Plan - 10/07/19 1540    Clinical Impression Statement  Added standing stepping strategies - posterior and lateral directions to HEP to perform at countertop, cues for posture. Focused on balance strategies on compliant surfaces today - pt with increased difficulty with eyes closed on foam with tendency to lose balance posteriorly. Pt with only one more session next week, discussed with pt about continuing with PT for an additional 1x week for 4 weeks throughout month of May. Will continue to progress towards LTGs.    Personal  Factors and Comorbidities  Comorbidity 3+;Time since onset of injury/illness/exacerbation    Comorbidities  diabetes, HTN, HLD, hypothyroid, hx of CVA (2019)    Examination-Activity Limitations  Stairs;Locomotion Level  Examination-Participation Restrictions  Community Activity    Stability/Clinical Decision Making  Stable/Uncomplicated    Rehab Potential  Excellent    PT Frequency  2x / week    PT Duration  4 weeks    PT Treatment/Interventions  Gait training;Stair training;Therapeutic activities;Patient/family education;Neuromuscular re-education;Balance training;Therapeutic exercise    PT Next Visit Plan  will need to check goals for re-cert (review HEP). balance on foam. PWR up moves for posture - continue standing and maybe try quadraped?, lateral/posterior stepping strategies, standing weight shifting, stepping over obstacles    PT Home Exercise Plan  plus seated PWR moves    Consulted and Agree with Plan of Care  Patient       Patient will benefit from skilled therapeutic intervention in order to improve the following deficits and impairments:  Abnormal gait, Decreased activity tolerance, Decreased balance, Decreased range of motion, Difficulty walking, Postural dysfunction  Visit Diagnosis: Other abnormalities of gait and mobility  Unsteadiness on feet  Abnormal posture     Problem List Patient Active Problem List   Diagnosis Date Noted  . Mild neurocognitive disorder, unclear etiology 09/24/2019  . Parkinson's disease 07/31/2017  . 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 Fall   . Multiple system atrophy   . 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 Postural dizziness with near syncope 07/14/2017  . Postural hypotension 01/22/2017  . Gastroesophageal reflux disease 04/13/2016  . Cough 04/13/2016  . 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 Essential hypertension 11/29/2015  . Type 2 diabetes mellitus without complication,  without long-term current use of insulin ([place]) 11/29/2015    Arliss Journey, PT, DPT  10/07/2019, 5:23 PM  [facility] [address] [place], [place], [address] Phone: [phone]   Fax:  [phone]  Name: Justin Gomez MRN: [id number] Date of Birth: [birth year]

## 2019-10-10 ENCOUNTER — Ambulatory Visit: Payer: HMO | Admitting: Physical Therapy

## 2019-10-13 ENCOUNTER — Other Ambulatory Visit: Payer: Self-pay

## 2019-10-13 ENCOUNTER — Ambulatory Visit: Payer: HMO | Admitting: Physical Therapy

## 2019-10-13 ENCOUNTER — Ambulatory Visit: Payer: HMO

## 2019-10-13 DIAGNOSIS — R2681 Unsteadiness on feet: Secondary | ICD-10-CM | POA: Diagnosis not present

## 2019-10-13 DIAGNOSIS — R2689 Other abnormalities of gait and mobility: Secondary | ICD-10-CM

## 2019-10-13 DIAGNOSIS — R293 Abnormal posture: Secondary | ICD-10-CM

## 2019-10-13 NOTE — Therapy (Signed)
[facility] [address] [place], [place], [address] Phone: [phone]   Fax:  [phone]  Patient Details  Name: Justin Gomez MRN: [id number] Date of Birth: 01/02/1941 Referring Provider:  Alonza Bogus, DO  Encounter Date: 10/13/2019   ST - Arrived/Cancel  During his previous appointment, pt told SLP he wanted to cancel his appointment on Friday 10-17-19, due to same time as his spin class. Pt then called Friday 10-10-19 attempting to cancel his ST for today 10-13-19. SLP insturcted front office to tell pt he canceled *10-17-19*, should come to ST today 10-13-19, and he/SLP could talk about further sessions today. [place] office left message on pt's voicemail. Pt assumed ST was cancelled today (after completing PT just prior to his scheduled ST), having not heard his voice mail message left by front office last week.  When asked by SLP if he recalled wanting to cancel 10-17-19 appointment and keeping today's appointment, pt stated "I must be confused." Pt did not wish to stay for ST today. SLP told pt to schedule at least one more ST to cont at least to check goals and to talk about the feasibility of cont'd ST. Pt left building today and no further appointments were scheduled. Pt may need to check with wife for scheduling.    [facility] ,[place], [place]  10/13/2019, 4:54 PM  [facility] [address] [place], [place], [address] Phone: [phone]   Fax:  [phone]

## 2019-10-13 NOTE — Therapy (Signed)
[facility] [address] [place], [place], [address] Phone: [phone]   Fax:  [phone]  Physical Therapy Treatment/Re-Cert  Patient Details  Name: Justin Gomez MRN: [id number] Date of Birth: 08-03-1940 Referring Provider (PT): Alonza Bogus, DO   Encounter Date: 10/13/2019  PT End of Session - 10/13/19 2100    Visit Number  6    Number of Visits  10    Date for PT Re-Evaluation  12/12/19   written for 4 week POC   Authorization Type  [employer]    PT Start Time  5681    PT Stop Time  1400    PT Time Calculation (min)  43 min    Equipment Utilized During Treatment  Gait belt    Activity Tolerance  Patient tolerated treatment well    Behavior During Therapy  [facility] for tasks assessed/performed       Past Medical History:  Diagnosis Date  . BPH (benign prostatic hypertrophy)   . 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 Diverticulosis   . Essential hypertension 11/29/2015  . Gastroesophageal reflux disease 04/13/2016  . Hiatal hernia   . Hyperlipidemia   . Hypothyroidism   . Lumbar herniated disc    L4  . Mild neurocognitive disorder, unclear etiology 09/24/2019  . Multiple system atrophy   . Parkinson's disease 07/31/2017  . Postural dizziness with near syncope 07/14/2017  . Postural hypotension 01/22/2017  . 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 Type 2 diabetes mellitus without complication, without long-term current use of insulin ([place]) 11/29/2015    Past Surgical History:  Procedure Laterality Date  . APPENDECTOMY  1956  . KNEE SURGERY  2009   right  . SHOULDER SURGERY  2010   left    There were no vitals filed for this visit.  Subjective Assessment - 10/13/19 1319    Subjective  No falls. Doing good.    Pertinent History  PMH: diabetes, HTN, HLD, hypothyroid, PD (diagonosed in 2019), hx of CVA (06/2017) - small cva left parietal and left  subdural hematoma 37m in diameter.    Patient Stated Goals  wants to slow the progression of PD    Currently in Pain?  No/denies         [facility] Assessment - 10/13/19 1323      Assessment   Medical Diagnosis  PD    Referring Provider (PT)  RWells GuilesTat, DO    Onset Date/Surgical Date  07/21/17      Precautions   Precautions  Fall      Mini-BESTest   Sit To Stand  Normal: Comes to stand without use of hands and stabilizes independently.    Rise to Toes  Normal: Stable for 3 s with maximum height.    Stand on one leg (left)  Moderate: < 20 s    Stand on one leg (right)  Moderate: < 20 s    Stand on one leg - lowest score  1    Compensatory Stepping Correction - Forward  Moderate: More than one step is required to recover equilibrium    Compensatory Stepping Correction - Backward  Moderate: More than one step is required to recover equilibrium    Compensatory Stepping Correction - Left Lateral  Moderate: Several steps to recover equilibrium    Compensatory Stepping Correction - Right Lateral  Moderate: Several steps to recover equilibrium    Stepping Corredtion Lateral - lowest score  1    Stance - Feet together, eyes open, firm surface   Normal: 30s    Stance - Feet together, eyes closed, foam surface   Moderate: < 30s   8 seconds   Incline - Eyes Closed  Normal: Stands independently 30s and aligns with gravity    Change in Gait Speed  Normal: Significantly changes walkling speed without imbalance    Walk with head turns - Horizontal  Moderate: performs head turns with reduction in gait speed.    Walk with pivot turns  Moderate:Turns with feet close SLOW (>4 steps) with good balance.    Step over obstacles  Normal: Able to step over box with minimal change of gait speed and with good balance.    Timed UP & GO with Dual Task  Moderate: Dual Task affects either counting OR walking (>10%) when compared to the TUG without Dual Task.    Mini-BEST total score  20      Timed Up and Go  Test   Normal TUG (seconds)  9.5    Cognitive TUG (seconds)  12.03   counting backwards from 77 by 3            Pt performs PWR! Moves in seated position x 10 reps   PWR! Up for improved posture  PWR! Rock for improved weightshifting - cues to look up at hands   PWR! Twist for improved trunk rotation - cues to reset in the middle and for intensity when clapping   PWR! Step for improved step initiation   Cues provided for intensity and technique (verbal and demonstrative)         [facility] Adult PT Treatment/Exercise - 10/13/19 1323      Ambulation/Gait   Ambulation/Gait  Yes    Ambulation/Gait Assistance  5: Supervision    Ambulation/Gait Assistance Details  throughout session - cues for step length and posture     Ambulation Distance (Feet)  300 Feet    Assistive device  None    Gait Pattern  Step-through pattern;Decreased arm swing - left;Decreased arm swing - right;Left flexed knee in stance;Right flexed knee in stance;Decreased trunk rotation;Trunk flexed;Decreased stride length    Ambulation Surface  Level;Indoor    Gait velocity  8.66 secs = 3.78 ft/sec      Therapeutic Activites    Therapeutic Activities  Other Therapeutic Activities    Other Therapeutic Activities  reviewed seated PWR! moves for HEP at pt has not been consistently performing at home (1-2x a week), educated pt on importance of exercise in PD and trying to perform 4-5x a week with pt verbalizing understanding and stating he feels better after performing              PT Education - 10/13/19 2059    Education Details  progress towards goals, continuing POC 1x week for 4 weeks, reviewed seated PWR moves and importance of perfoming at least 4-5x a week (pt has only been performing 1-2x a week)    Person(s) Educated  Patient    Methods  Explanation;Demonstration    Comprehension  Verbalized understanding;Returned demonstration;Need further instruction       PT Short Term Goals - 08/27/19  1409      PT SHORT TERM GOAL #1   Title  all STGs = LTGs        PT Long Term Goals - 10/13/19 1323      PT LONG TERM GOAL #1   Title  Pt will be independent with  final HEP and community fitness program in order to build upon functional gains made in therapy. ALL LTGS DUE 11/24/19    Baseline  pt has not been consistently performing HEP    Time  6   due to delay in scheduling, written for 4 weeks   Period  Weeks    Status  On-going    Target Date  11/24/19      PT LONG TERM GOAL #2   Title  Pt will improve mini BEST score to at least a 22/28 in order to demo decr fall risk.    Baseline  20/28 on 10/13/19    Status  Revised      PT LONG TERM GOAL #3   Title  Pt will perform posterior push and release in 2 steps or less with no LOB in order to demonstrate improved stepping strategies for balance.    Baseline  --    Status  New      PT LONG TERM GOAL #4   Title  Pt will improve TUG and TUG cognitive scores to </= 10% difference of each other, for improved dual tasking with gait.    Baseline  TUG 9.5 seconds, cog TUG 12.03 seconds    Status  On-going            Plan - 10/13/19 2108    Clinical Impression Statement  Focus of today's sessioion was assessing pt's LTGs. Pt met LTG in regards to miniBEST - scored a 20/28 today (previously was 15/28), but pt still remains at a higher risk for falls. Pt also improved his gait speed today to 3.78 ft/sec - indicating a community ambulator. Pt did not meet LTG in regards to HEP and cog TUG/TUG. Pt with greater than 10% difference in cog TUG, indicating decr ability with dual tasking during gait. Needed to review seated PWR! moves as pt has not been consistently performing at home. Will continue with POC for an additional 1x week for 4 weeks. Pt to call back to schedule next appointments after checking with his wife whne she can take him to therapy. Revised LTGs as appropriate. Pt will continue to benefit from skilled PT in order to address  impairments listed below and to decr risk of falls.    Personal Factors and Comorbidities  Comorbidity 3+;Time since onset of injury/illness/exacerbation    Comorbidities  diabetes, HTN, HLD, hypothyroid, hx of CVA (2019)    Examination-Activity Limitations  Stairs;Locomotion Level    Examination-Participation Restrictions  Community Activity    Stability/Clinical Decision Making  Stable/Uncomplicated    Rehab Potential  Excellent    PT Frequency  1x / week    PT Duration  4 weeks    PT Treatment/Interventions  Gait training;Stair training;Therapeutic activities;Patient/family education;Neuromuscular re-education;Balance training;Therapeutic exercise    PT Next Visit Plan  turns, dual tasking, stair training. balance on foam. PWR up moves for posture - continue standing and maybe try quadraped?, lateral/posterior stepping strategies, standing weight shifting, stepping over obstacles    PT Home Exercise Plan  plus seated PWR moves    Consulted and Agree with Plan of Care  Patient       Patient will benefit from skilled therapeutic intervention in order to improve the following deficits and impairments:  Abnormal gait, Decreased activity tolerance, Decreased balance, Decreased range of motion, Difficulty walking, Postural dysfunction  Visit Diagnosis: Other abnormalities of gait and mobility  Unsteadiness on feet  Abnormal posture     Problem List Patient Active  Problem List   Diagnosis Date Noted  . Mild neurocognitive disorder, unclear etiology 09/24/2019  . Parkinson's disease 07/31/2017  . 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 Fall   . Multiple system atrophy   . 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 Postural dizziness with near syncope 07/14/2017  . Postural hypotension 01/22/2017  . Gastroesophageal reflux disease 04/13/2016  . Cough 04/13/2016  . 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 Essential hypertension 11/29/2015  .  Type 2 diabetes mellitus without complication, without long-term current use of insulin ([place]) 11/29/2015    Arliss Journey, PT, DPT  10/13/2019, 9:14 PM  [facility] [address] [place], [place], [address] Phone: [phone]   Fax:  [phone]  Name: Justin Gomez MRN: [id number] Date of Birth: 06-08-1941

## 2019-10-17 ENCOUNTER — Ambulatory Visit: Payer: HMO | Admitting: Physical Therapy

## 2019-10-20 ENCOUNTER — Ambulatory Visit: Payer: HMO | Attending: Neurology

## 2019-10-20 ENCOUNTER — Other Ambulatory Visit: Payer: Self-pay

## 2019-10-20 DIAGNOSIS — R293 Abnormal posture: Secondary | ICD-10-CM | POA: Insufficient documentation

## 2019-10-20 DIAGNOSIS — R2681 Unsteadiness on feet: Secondary | ICD-10-CM | POA: Diagnosis not present

## 2019-10-20 DIAGNOSIS — R2689 Other abnormalities of gait and mobility: Secondary | ICD-10-CM | POA: Insufficient documentation

## 2019-10-20 DIAGNOSIS — R1312 Dysphagia, oropharyngeal phase: Secondary | ICD-10-CM | POA: Diagnosis not present

## 2019-10-20 DIAGNOSIS — R41841 Cognitive communication deficit: Secondary | ICD-10-CM | POA: Diagnosis not present

## 2019-10-20 NOTE — Patient Instructions (Addendum)
   When eating, make sure you are keeping all distractions off - TV, radio, phone, etc  Continue to do your exercises - get 30-40 reps in - as I said today, doing them for at least 6 weeks straight is the goal. Reportedly you said you haven't been consistent with doing the swallowing exercises. Again, try to find something  that you do EVERY day to pair the exercises with, to increase your frequency and consistency in getting all 30-40 reps each day. After you do the exercises consistently for 6 weeks, continue to do them twice each week to maintain any muscle strength you've gained.  If you have increase in coughing, throat clearing, or choking during meals, talk with Dr. Carles Collet about this ASAP.

## 2019-10-20 NOTE — Therapy (Signed)
[facility] [address] [place], [place], [address] Phone: [phone]   Fax:  [phone]  Speech Language Pathology Treatment/ Discharge summary  Patient Details  Name: Justin Gomez MRN: [id number] Date of Birth: 1940-11-06 Referring Provider (SLP): Alonza Bogus, DO   Encounter Date: 10/20/2019  End of Session - 10/20/19 1444    Visit Number  6    Number of Visits  17    Date for SLP Re-Evaluation  11/25/19    SLP Start Time  1443    SLP Stop Time   1437    SLP Time Calculation (min)  34 min    Activity Tolerance  Patient tolerated treatment well       Past Medical History:  Diagnosis Date  . BPH (benign prostatic hypertrophy)   . 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 Diverticulosis   . Essential hypertension 11/29/2015  . Gastroesophageal reflux disease 04/13/2016  . Hiatal hernia   . Hyperlipidemia   . Hypothyroidism   . Lumbar herniated disc    L4  . Mild neurocognitive disorder, unclear etiology 09/24/2019  . Multiple system atrophy   . Parkinson's disease 07/31/2017  . Postural dizziness with near syncope 07/14/2017  . Postural hypotension 01/22/2017  . 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 Type 2 diabetes mellitus without complication, without long-term current use of insulin ([place]) 11/29/2015    Past Surgical History:  Procedure Laterality Date  . APPENDECTOMY  1956  . KNEE SURGERY  2009   right  . SHOULDER SURGERY  2010   left    There were no vitals filed for this visit.  Subjective Assessment - 10/20/19 1408    Subjective  "We had company over the weekend." (pt's reason for not doing HEP)    Currently in Pain?  No/denies            ADULT SLP TREATMENT - 10/20/19 1410      General Information   Behavior/Cognition  Alert;Cooperative;Pleasant mood      Treatment Provided   Treatment provided  Dysphagia      Dysphagia  Treatment   Temperature Spikes Noted  No    Respiratory Status  Room air    Oral Cavity - Dentition  Adequate natural dentition    Treatment Methods  Skilled observation;Patient/caregiver education    Patient observed directly with PO's  Yes    Type of PO's observed  Dysphagia 3 (soft);Thin liquids    Liquids provided via  Cup    Oral Phase Signs & Symptoms  --   nothing noted or reported   Pharyngeal Phase Signs & Symptoms  Other (comment)   nothing reported/noted   Type of cueing  Verbal   initial -occasional (additional swallow) faded to mod I   Other treatment/comments  "My breakfast and lunch - I didn't cough at all." Pt reports he is not completing HEP as directed - did not perform over the weekend.  SLP reminded pt to pair HEP with something he does each day. SLP reiterated he needs 6 weeks straight of HEP each day - then he can reduce frequency to twice each week for best chance to maintain swallowing skills. Pt was independent today with procedure for HEP.       Assessment / Recommendations / Plan   Plan  Continue with current plan of care;Discharge SLP treatment due to (comment)   pt reached max potential     Dysphagia Recommendations  Diet recommendations  Regular;Thin liquid    Liquids provided via  Cup    Medication Administration  Whole meds with puree    Supervision  Patient able to self feed;Intermittent supervision to cue for compensatory strategies    Compensations  Slow rate;Small sips/bites;Multiple dry swallows after each bite/sip;Follow solids with liquid;Clear throat intermittently      Progression Toward Goals   Progression toward goals  Goals met, education completed, patient discharged from SLP       SLP Education - 10/20/19 1440    Education Details  need 30-40 reps/day for 6 weeks, then pare down frequency to twice a week    Person(s) Educated  Patient    Methods  Explanation;Handout    Comprehension  Verbalized understanding       SLP Short Term  Goals - 10/20/19 1500      SLP SHORT TERM GOAL #1   Title  pt will follow swallow precautions with POs with rare min A over 3 sessions    Baseline  10-03-19, 10-07-19    Status  Partially Met      SLP SHORT TERM GOAL #2   Title  pt will clear throat and reswallow when experiencing hydrophonia ("wet" voice) with min nonverbal cues in  sessions.    Status  Deferred   no hydrophonia     SLP SHORT TERM GOAL #3   Title  pt will complete HEP for swallowing deficits with rare min A in three sessions    Baseline  10-03-19, 10-07-19    Status  Achieved      SLP SHORT TERM GOAL #4   Title  pt will complete cognitve linguistic assessment in first 6 sessions, if clinicially indicated    Status  Deferred   pt desires d/c      SLP Long Term Goals - 10/20/19 1501      SLP LONG TERM GOAL #1   Title  pt will complete HEP for swallowing deficits with modified independence in three sessions    Baseline  10-03-19, 10-07-19    Period  --   or 17 sessions, for all LTGs   Status  Partially Met      SLP LONG TERM GOAL #2   Title  pt will demo awareness of hydrophonic voice by clearing throat/reswallowing independently in 3 sessions    Status  Deferred      SLP LONG TERM GOAL #3   Title  pt will follow swallow precautions with POs with modified independence in 4 sessions    Baseline  10-07-19    Status  Partially Met       Plan - 10/20/19 1445    Clinical Impression Statement  Pt presents primarily with dysphagia due to Parkison's disease - compensatory strategies were again reviewed with pt today, pt cont to not adhere to recommended frequency of completion of swallow exercises - SLP reiterated again today to pt to complete 30-40 reps of each, each day. Pt cont to present with cognitive communication deficits in at least memory and processing speed. Pt will likely require cognitive communication assessment at some point in the future. Pt's volume, currently, is WNL (average low 70s dB) in simple to mod  complex conversation. Pt is appropriate for d/c today due to safe with swallow precautions and procedure for HEP is WFL/WNL, pt chooses to perform HEP at suboptimal level.    Treatment/Interventions  Aspiration precaution training;Pharyngeal strengthening exercises;Diet toleration management by SLP;Trials of upgraded texture/liquids;Cueing hierarchy;Cognitive reorganization;Language facilitation;Internal/external aids;Patient/family  education;SLP instruction and feedback;Compensatory strategies    Potential to Achieve Goals  Good    Potential Considerations  Ability to learn/carryover information       Patient will benefit from skilled therapeutic intervention in order to improve the following deficits and impairments:   Dysphagia, oropharyngeal phase  Cognitive communication deficit   SPEECH THERAPY DISCHARGE SUMMARY  Visits from Start of Care: 6  Current functional level related to goals / functional outcomes: See above goal summary. Pt was eager to d/c. Pt cont to report suboptimal frequency of HEP completion. He performed HEP independently in session today. Pt cont to demo cognitive linguistic deficits - qualifies for cognitive linguistic therapy however SLP questions pt's "buy in" to therapy given his limited participation and desire for d/c during this treatment course.   Remaining deficits: When following swallowing precautions - none. Cognitive - communication deficits.   Education / Equipment: HEP procedure, need to complete HEP as directed.   Plan: Patient agrees to discharge.  Patient goals were partially met. Patient is being discharged due to the patient's request.  ?????       Problem List Patient Active Problem List   Diagnosis Date Noted  . Mild neurocognitive disorder, unclear etiology 09/24/2019  . Parkinson's disease 07/31/2017  . 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 Fall   . Multiple system atrophy   . 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 Postural dizziness with near syncope 07/14/2017  . Postural hypotension 01/22/2017  . Gastroesophageal reflux disease 04/13/2016  . Cough 04/13/2016  . 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 Essential hypertension 11/29/2015  . Type 2 diabetes mellitus without complication, without long-term current use of insulin ([place]) 11/29/2015    [facility] ,[place], [place]  10/20/2019, 3:04 PM  [facility] [address] [place], [place], [address] Phone: [phone]   Fax:  [phone]   Name: Justin Gomez MRN: [id number] Date of Birth: [birth year]

## 2019-10-21 ENCOUNTER — Ambulatory Visit: Payer: HMO | Admitting: Physical Therapy

## 2019-10-21 DIAGNOSIS — E039 Hypothyroidism, unspecified: Secondary | ICD-10-CM | POA: Diagnosis not present

## 2019-10-21 DIAGNOSIS — R2681 Unsteadiness on feet: Secondary | ICD-10-CM

## 2019-10-21 DIAGNOSIS — R2689 Other abnormalities of gait and mobility: Secondary | ICD-10-CM

## 2019-10-21 DIAGNOSIS — R293 Abnormal posture: Secondary | ICD-10-CM

## 2019-10-21 DIAGNOSIS — R1312 Dysphagia, oropharyngeal phase: Secondary | ICD-10-CM | POA: Diagnosis not present

## 2019-10-21 DIAGNOSIS — E119 Type 2 diabetes mellitus without complications: Secondary | ICD-10-CM | POA: Diagnosis not present

## 2019-10-21 NOTE — Therapy (Signed)
[facility] [address] [place], [place], [address] Phone: [phone]   Fax:  [phone]  Physical Therapy Treatment  Patient Details  Name: Justin Gomez MRN: [id number] Date of Birth: 1940/08/30 Referring Provider (PT): Alonza Bogus, DO   Encounter Date: 10/21/2019  PT End of Session - 10/21/19 1424    Visit Number  7    Number of Visits  10    Date for PT Re-Evaluation  12/12/19   written for 4 week POC   Authorization Type  [employer]    PT Start Time  1232    PT Stop Time  1313    PT Time Calculation (min)  41 min    Equipment Utilized During Treatment  Gait belt    Activity Tolerance  Patient tolerated treatment well    Behavior During Therapy  [facility] for tasks assessed/performed       Past Medical History:  Diagnosis Date  . BPH (benign prostatic hypertrophy)   . 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 Diverticulosis   . Essential hypertension 11/29/2015  . Gastroesophageal reflux disease 04/13/2016  . Hiatal hernia   . Hyperlipidemia   . Hypothyroidism   . Lumbar herniated disc    L4  . Mild neurocognitive disorder, unclear etiology 09/24/2019  . Multiple system atrophy   . Parkinson's disease 07/31/2017  . Postural dizziness with near syncope 07/14/2017  . Postural hypotension 01/22/2017  . 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 Type 2 diabetes mellitus without complication, without long-term current use of insulin ([place]) 11/29/2015    Past Surgical History:  Procedure Laterality Date  . APPENDECTOMY  1956  . KNEE SURGERY  2009   right  . SHOULDER SURGERY  2010   left    There were no vitals filed for this visit.  Subjective Assessment - 10/21/19 1234    Subjective  Hasn't been able to try the exercises at home - has had company. No falls. States he did get his cough fixed by learning techniques from [place].    Pertinent History  PMH:  diabetes, HTN, HLD, hypothyroid, PD (diagonosed in 2019), hx of CVA (06/2017) - small cva left parietal and left subdural hematoma 37mm in diameter.    Patient Stated Goals  wants to slow the progression of PD    Currently in Pain?  No/denies                     Pt performs PWR! Moves in standing position   PWR! Up for improved posture x10 reps at edge of mat table, demo cues for technique  PWR! Rock for improved weighshifting - at [employer] x5 reps weight shifting R/L and then adding in reaching and looking up towards hands x10 reps B, intermittent UE support   PWR! Twist for improved trunk rotation x10 reps at edge of mat table, chair anteriorly in case for balance  PWR! Step for improved step initiation - performed first lateral stepping strategy with BUE support at counter (cues for technique and weight shift) and then performed x5 reps B taking a step laterally with also extended arm out, cues to look towards hand     Multidirectional stepping following instruction sheet (taped on cabinet) - first following the directions in order and then backwards and then adding cognitive challenge by asking pt to do the opposite of the instructions, pt taking incr time with cognitive challenge, needed cues for wider  BOS when returning to center         Balance Exercises - 10/21/19 1434      Balance Exercises: Standing   Standing Eyes Closed  Foam/compliant surface;3 reps;20 secs;30 secs;Limitations    Standing Eyes Closed Limitations  with wider BOS 2 x 30 seconds, progressing to more narrow BOS, 3 x 20 seconds, cues to reset for tall posture between each rep    Wall Bumps  Hip;Eyes opened;10 reps    Wall Bumps Limitations  on foam with wide BOS, cues for hip strategy and shifting weight more anteriorly onto toes when returning back to center          PT Short Term Goals - 08/27/19 1409      PT SHORT TERM GOAL #1   Title  all STGs = LTGs        PT Long Term Goals -  10/13/19 1323      PT LONG TERM GOAL #1   Title  Pt will be independent with final HEP and community fitness program in order to build upon functional gains made in therapy. ALL LTGS DUE 11/24/19    Baseline  pt has not been consistently performing HEP    Time  6   due to delay in scheduling, written for 4 weeks   Period  Weeks    Status  On-going    Target Date  11/24/19      PT LONG TERM GOAL #2   Title  Pt will improve mini BEST score to at least a 22/28 in order to demo decr fall risk.    Baseline  20/28 on 10/13/19    Status  Revised      PT LONG TERM GOAL #3   Title  Pt will perform posterior push and release in 2 steps or less with no LOB in order to demonstrate improved stepping strategies for balance.    Baseline  --    Status  New      PT LONG TERM GOAL #4   Title  Pt will improve TUG and TUG cognitive scores to </= 10% difference of each other, for improved dual tasking with gait.    Baseline  TUG 9.5 seconds, cog TUG 12.03 seconds    Status  On-going            Plan - 10/21/19 1435    Clinical Impression Statement  Focus of today's skilled session was standing PWR! moves, multi directional stepping, and balance on compliant surfaces. Pt needed verbal and demonstrative cues for PWR moves today. Pt challenged by multi-directional stepping especially with adding a cognitive challenge, pt demonstrating more slowed movement, but no LOB. Will continue to progress towards LTGS.    Personal Factors and Comorbidities  Comorbidity 3+;Time since onset of injury/illness/exacerbation    Comorbidities  diabetes, HTN, HLD, hypothyroid, hx of CVA (2019)    Examination-Activity Limitations  Stairs;Locomotion Level    Examination-Participation Restrictions  Community Activity    Stability/Clinical Decision Making  Stable/Uncomplicated    Rehab Potential  Excellent    PT Frequency  1x / week    PT Duration  4 weeks    PT Treatment/Interventions  Gait training;Stair  training;Therapeutic activities;Patient/family education;Neuromuscular re-education;Balance training;Therapeutic exercise    PT Next Visit Plan  REVIEW SEATED PWR MOVES, standing PWR moves. turns, dual tasking, stair training. balance on foam.  lateral/posterior stepping strategies, standing weight shifting, stepping over obstacles    PT Home Exercise Plan  plus seated PWR moves  Consulted and Agree with Plan of Care  Patient       Patient will benefit from skilled therapeutic intervention in order to improve the following deficits and impairments:  Abnormal gait, Decreased activity tolerance, Decreased balance, Decreased range of motion, Difficulty walking, Postural dysfunction  Visit Diagnosis: Other abnormalities of gait and mobility  Unsteadiness on feet  Abnormal posture     Problem List Patient Active Problem List   Diagnosis Date Noted  . Mild neurocognitive disorder, unclear etiology 09/24/2019  . Parkinson's disease 07/31/2017  . 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 Fall   . Multiple system atrophy   . 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 Postural dizziness with near syncope 07/14/2017  . Postural hypotension 01/22/2017  . Gastroesophageal reflux disease 04/13/2016  . Cough 04/13/2016  . 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 Essential hypertension 11/29/2015  . Type 2 diabetes mellitus without complication, without long-term current use of insulin ([place]) 11/29/2015    Arliss Journey, PT, DPT  10/21/2019, 2:37 PM  [facility] [address] [place], [place], [address] Phone: [phone]   Fax:  [phone]  Name: Justin Gomez MRN: [id number] Date of Birth: 26-Jan-1941

## 2019-10-27 ENCOUNTER — Other Ambulatory Visit: Payer: Self-pay

## 2019-10-27 ENCOUNTER — Ambulatory Visit: Payer: HMO | Admitting: Physical Therapy

## 2019-10-27 VITALS — BP 133/65 | HR 71

## 2019-10-27 DIAGNOSIS — R293 Abnormal posture: Secondary | ICD-10-CM

## 2019-10-27 DIAGNOSIS — R1312 Dysphagia, oropharyngeal phase: Secondary | ICD-10-CM | POA: Diagnosis not present

## 2019-10-27 DIAGNOSIS — R2689 Other abnormalities of gait and mobility: Secondary | ICD-10-CM

## 2019-10-27 DIAGNOSIS — R2681 Unsteadiness on feet: Secondary | ICD-10-CM

## 2019-10-27 NOTE — Therapy (Signed)
[facility] [address] [place], [place], [address] Phone: [phone]   Fax:  [phone]  Physical Therapy Treatment  Patient Details  Name: Justin Gomez MRN: [id number] Date of Birth: 12-Jul-1940 Referring Provider (PT): Alonza Bogus, DO   Encounter Date: 10/27/2019  PT End of Session - 10/27/19 1609    Visit Number  8    Number of Visits  10    Date for PT Re-Evaluation  12/12/19   written for 4 week POC   Authorization Type  [employer]    PT Start Time  1318    PT Stop Time  1400    PT Time Calculation (min)  42 min    Equipment Utilized During Treatment  Gait belt    Activity Tolerance  Patient tolerated treatment well    Behavior During Therapy  [facility] for tasks assessed/performed       Past Medical History:  Diagnosis Date  . BPH (benign prostatic hypertrophy)   . 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 Diverticulosis   . Essential hypertension 11/29/2015  . Gastroesophageal reflux disease 04/13/2016  . Hiatal hernia   . Hyperlipidemia   . Hypothyroidism   . Lumbar herniated disc    L4  . Mild neurocognitive disorder, unclear etiology 09/24/2019  . Multiple system atrophy   . Parkinson's disease 07/31/2017  . Postural dizziness with near syncope 07/14/2017  . Postural hypotension 01/22/2017  . 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 Type 2 diabetes mellitus without complication, without long-term current use of insulin ([place]) 11/29/2015    Past Surgical History:  Procedure Laterality Date  . APPENDECTOMY  1956  . KNEE SURGERY  2009   right  . SHOULDER SURGERY  2010   left    Vitals:   10/27/19 1321  BP: 133/65  Pulse: 71    Subjective Assessment - 10/27/19 1323    Subjective  Feeling a little more unsteady today. No falls. Are doing the exercises at least 1x a day.    Pertinent History  PMH: diabetes, HTN, HLD, hypothyroid, PD  (diagonosed in 2019), hx of CVA (06/2017) - small cva left parietal and left subdural hematoma 25mm in diameter.    Patient Stated Goals  wants to slow the progression of PD    Currently in Pain?  No/denies          NMR:     Pt performs PWR! Moves in seated position x 10 reps   PWR! Up for improved posture   PWR! Rock for improved weighshifting - pt needing mod verbal/demo cues for proper technique and pt reporting increased low back discomfort after a couple reps, removed from HEP and instead performing standing PWR! Rock at [employer]! Twist for improved trunk rotation - 5 reps B, min demo cues   PWR! Step for improved step initiation - x5 reps B step out and then step in, x5 reps B progressing to step out/out and then step in/in      PWR! Rock in standing at [employer] with UE support: x5 weight shifting laterally with cues to push off through toes and then adding x7 reps B with reaching towards cabinet - added to HEP, pt reporting feeling much better performing in standing vs. Sitting              Balance Exercises - 10/27/19 1614      Balance Exercises: Standing   Standing Eyes Closed  Foam/compliant surface;3 reps;20 secs;30 secs;Limitations    Standing Eyes Closed Limitations  with wider BOS on blue foam beam, cues initially for upright posture before closing eyes 4 x 15-20 second reps, with pt having tendency to lose balance posteriorly    Wall Bumps  Hip;Eyes opened;10 reps    Wall Bumps Limitations  on foam with wide BOS, cues for hip strategy with no UE support    Other Standing Exercises  Standing outside of // bars with single UE support and on blue foam beam: alternating posterior stepping strategy x10 reps B with intermittent times of no UE support with cues for posture and close min guard at times for balance, with single UE support alternating forward stepping strategy x10 reps B with cues for foot clearance        PT Education - 10/27/19 1403     Education Details  revisions to HEP - standing vs. seated PWR! rock    [employer]) Educated  Patient    Methods  Explanation;Demonstration;Handout    Comprehension  Verbalized understanding;Returned demonstration;Need further instruction       PT Short Term Goals - 08/27/19 1409      PT SHORT TERM GOAL #1   Title  all STGs = LTGs        PT Long Term Goals - 10/13/19 1323      PT LONG TERM GOAL #1   Title  Pt will be independent with final HEP and community fitness program in order to build upon functional gains made in therapy. ALL LTGS DUE 11/24/19    Baseline  pt has not been consistently performing HEP    Time  6   due to delay in scheduling, written for 4 weeks   Period  Weeks    Status  On-going    Target Date  11/24/19      PT LONG TERM GOAL #2   Title  Pt will improve mini BEST score to at least a 22/28 in order to demo decr fall risk.    Baseline  20/28 on 10/13/19    Status  Revised      PT LONG TERM GOAL #3   Title  Pt will perform posterior push and release in 2 steps or less with no LOB in order to demonstrate improved stepping strategies for balance.    Baseline  --    Status  New      PT LONG TERM GOAL #4   Title  Pt will improve TUG and TUG cognitive scores to </= 10% difference of each other, for improved dual tasking with gait.    Baseline  TUG 9.5 seconds, cog TUG 12.03 seconds    Status  On-going            Plan - 10/27/19 1615    Clinical Impression Statement  Focus of today's skilled session was reviewing seated PWR! moves from HEP, with pt able to demo proper technique except for seated PWR! rock with pt reporting increased low back discomfort. Instead, added standing PWR! rock at countertop with reaching towards cabinets for weight shift with pt reporting it felt much better. Pt challenged today by alternating stepping strategies on a foam surface, needing UE support and min guard. will continue to progress towards LTGs.    Personal Factors and  Comorbidities  Comorbidity 3+;Time since onset of injury/illness/exacerbation    Comorbidities  diabetes, HTN, HLD, hypothyroid, hx of CVA (2019)    Examination-Activity Limitations  Stairs;Locomotion Level  Examination-Participation Restrictions  Community Activity    Stability/Clinical Decision Making  Stable/Uncomplicated    Rehab Potential  Excellent    PT Frequency  1x / week    PT Duration  4 weeks    PT Treatment/Interventions  Gait training;Stair training;Therapeutic activities;Patient/family education;Neuromuscular re-education;Balance training;Therapeutic exercise    PT Next Visit Plan  stepping strategies on compliant surfaces, standing PWR moves. GAIT/WALKING PROGRAM. turns, dual tasking, stair training.    PT Home Exercise Plan  plus seated PWR moves    Consulted and Agree with Plan of Care  Patient       Patient will benefit from skilled therapeutic intervention in order to improve the following deficits and impairments:  Abnormal gait, Decreased activity tolerance, Decreased balance, Decreased range of motion, Difficulty walking, Postural dysfunction  Visit Diagnosis: Unsteadiness on feet  Other abnormalities of gait and mobility  Abnormal posture     Problem List Patient Active Problem List   Diagnosis Date Noted  . Mild neurocognitive disorder, unclear etiology 09/24/2019  . Parkinson's disease 07/31/2017  . 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 Fall   . Multiple system atrophy   . 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 Postural dizziness with near syncope 07/14/2017  . Postural hypotension 01/22/2017  . Gastroesophageal reflux disease 04/13/2016  . Cough 04/13/2016  . 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 Essential hypertension 11/29/2015  . Type 2 diabetes mellitus without complication, without long-term current use of insulin ([place]) 11/29/2015    Arliss Journey, PT, DPT  10/27/2019, 4:18 PM  [facility] [address] [place], [place], [address] Phone: [phone]   Fax:  [phone]  Name: KEINO WATES MRN: [id number] Date of Birth: [birth year]

## 2019-10-27 NOTE — Patient Instructions (Addendum)
    Not performing seated PWR! Weight shift - instead replaced with standing PWR! ROCK at countertop.

## 2019-10-28 DIAGNOSIS — I10 Essential (primary) hypertension: Secondary | ICD-10-CM | POA: Diagnosis not present

## 2019-10-28 DIAGNOSIS — E785 Hyperlipidemia, unspecified: Secondary | ICD-10-CM | POA: Diagnosis not present

## 2019-10-28 DIAGNOSIS — I639 Cerebral infarction, unspecified: Secondary | ICD-10-CM | POA: Diagnosis not present

## 2019-10-28 DIAGNOSIS — G20 Parkinson's disease: Secondary | ICD-10-CM | POA: Diagnosis not present

## 2019-10-28 DIAGNOSIS — R2681 Unsteadiness on feet: Secondary | ICD-10-CM | POA: Diagnosis not present

## 2019-10-28 DIAGNOSIS — M21371 Foot drop, right foot: Secondary | ICD-10-CM | POA: Diagnosis not present

## 2019-10-28 DIAGNOSIS — S065X9A Traumatic subdural hemorrhage with loss of consciousness of unspecified duration, initial encounter: Secondary | ICD-10-CM | POA: Diagnosis not present

## 2019-10-28 DIAGNOSIS — E039 Hypothyroidism, unspecified: Secondary | ICD-10-CM | POA: Diagnosis not present

## 2019-10-28 DIAGNOSIS — R351 Nocturia: Secondary | ICD-10-CM | POA: Diagnosis not present

## 2019-10-28 DIAGNOSIS — E119 Type 2 diabetes mellitus without complications: Secondary | ICD-10-CM | POA: Diagnosis not present

## 2019-10-28 DIAGNOSIS — R32 Unspecified urinary incontinence: Secondary | ICD-10-CM | POA: Diagnosis not present

## 2019-10-28 DIAGNOSIS — K219 Gastro-esophageal reflux disease without esophagitis: Secondary | ICD-10-CM | POA: Diagnosis not present

## 2019-11-04 ENCOUNTER — Ambulatory Visit: Payer: HMO | Admitting: Physical Therapy

## 2019-11-06 ENCOUNTER — Telehealth: Payer: Self-pay | Admitting: Neurology

## 2019-11-06 NOTE — Telephone Encounter (Signed)
The front can certainly place him on cx list if he would like but I don't have anything urgent (and would guess that this isn't urgent since his testing was over a month ago).

## 2019-11-06 NOTE — Telephone Encounter (Signed)
Patient wants to speak to someone about the neuropshy  Testing he had done he would like to speak to Dr Tat

## 2019-11-06 NOTE — Telephone Encounter (Signed)
Patient states he had a neuropsy test and has some questions. Patient states Dr Melvyn Novas suggested the patient review the results with Dr Tat. Patient wants to schedule an appt to discuss these test results.

## 2019-11-07 NOTE — Telephone Encounter (Signed)
Left detailed message on patient machine informing him that we will place him on the cancellation list. Advised patient to contact office with any questions or concerns.

## 2019-11-12 ENCOUNTER — Ambulatory Visit: Payer: HMO | Admitting: Physical Therapy

## 2019-11-12 ENCOUNTER — Other Ambulatory Visit: Payer: Self-pay

## 2019-11-12 VITALS — BP 112/57 | HR 78

## 2019-11-12 DIAGNOSIS — R1312 Dysphagia, oropharyngeal phase: Secondary | ICD-10-CM | POA: Diagnosis not present

## 2019-11-12 DIAGNOSIS — R2681 Unsteadiness on feet: Secondary | ICD-10-CM

## 2019-11-12 DIAGNOSIS — R2689 Other abnormalities of gait and mobility: Secondary | ICD-10-CM

## 2019-11-12 DIAGNOSIS — R293 Abnormal posture: Secondary | ICD-10-CM

## 2019-11-12 NOTE — Therapy (Signed)
[facility] [address] [place], [place], [address] Phone: [phone]   Fax:  [phone]  Physical Therapy Treatment  Patient Details  Name: Justin Gomez MRN: [id number] Date of Birth: 04-23-1941 Referring Provider (PT): Alonza Bogus, DO   Encounter Date: 11/12/2019  PT End of Session - 11/12/19 1652    Visit Number  9    Number of Visits  10    Date for PT Re-Evaluation  12/12/19   written for 4 week POC   Authorization Type  [employer]    PT Start Time  1316    PT Stop Time  1358    PT Time Calculation (min)  42 min    Equipment Utilized During Treatment  Gait belt    Activity Tolerance  Patient tolerated treatment well    Behavior During Therapy  [facility] for tasks assessed/performed       Past Medical History:  Diagnosis Date  . BPH (benign prostatic hypertrophy)   . 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 Diverticulosis   . Essential hypertension 11/29/2015  . Gastroesophageal reflux disease 04/13/2016  . Hiatal hernia   . Hyperlipidemia   . Hypothyroidism   . Lumbar herniated disc    L4  . Mild neurocognitive disorder, unclear etiology 09/24/2019  . Multiple system atrophy   . Parkinson's disease 07/31/2017  . Postural dizziness with near syncope 07/14/2017  . Postural hypotension 01/22/2017  . 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 Type 2 diabetes mellitus without complication, without long-term current use of insulin ([place]) 11/29/2015    Past Surgical History:  Procedure Laterality Date  . APPENDECTOMY  1956  . KNEE SURGERY  2009   right  . SHOULDER SURGERY  2010   left    Vitals:   11/12/19 1321 11/12/19 1323  BP: (!) 118/59 (!) 112/57  Pulse: 70 78    Subjective Assessment - 11/12/19 1318    Subjective  Was constipated last week - wasn't able to do his exercises or cycling. Feeling a little more unsteady today.    Pertinent  History  PMH: diabetes, HTN, HLD, hypothyroid, PD (diagonosed in 2019), hx of CVA (06/2017) - small cva left parietal and left subdural hematoma 62mm in diameter.    Patient Stated Goals  wants to slow the progression of PD    Currently in Pain?  No/denies            NMR:      Pt performs PWR! Moves in standing position 2 x 10 reps of each    PWR! Up for improved posture  PWR! Rock for improved weighshifting -use of visual cue for pt to reach up and over   PWR! Twist for improved trunk rotation - tactile and verbal cues to stand tall and reset in middle before twisting   PWR! Step for improved step initiation - pt with difficulty performing without demo cues, cues for step height, improved technique with 2nd rep   Verbal/demo cues provided for technique and intensity. Pt rating RPE at 6-7/10.         -with single UE support: staggered stance A/P weight shifting with arm swing x15 reps B, initial demo cues for technique - needed a couple reps to perform correctly with cues for posture and weight shift  - on blue compliant mat: standing alternating marching 2 x 10 reps with use of boomwhacker as auditory/visual cue of how high to march with  single UE support -on blue compliant mat: alternating posterior and forward stepping strategies x10 reps B for each with intermittent UE support, cues for proper weight shift   [facility] Adult PT Treatment/Exercise - 11/12/19 0001      Ambulation/Gait   Ambulation/Gait  Yes    Ambulation/Gait Assistance  5: Supervision    Ambulation/Gait Assistance Details  cues for step length and posture, pt with more natural arm swing with incr step length    Ambulation Distance (Feet)  230 Feet    Assistive device  None    Gait Pattern  Step-through pattern;Decreased arm swing - left;Decreased arm swing - right;Left flexed knee in stance;Right flexed knee in stance;Decreased trunk rotation;Trunk flexed;Decreased stride length    Ambulation Surface   Level;Indoor               PT Short Term Goals - 08/27/19 1409      PT SHORT TERM GOAL #1   Title  all STGs = LTGs        PT Long Term Goals - 10/13/19 1323      PT LONG TERM GOAL #1   Title  Pt will be independent with final HEP and community fitness program in order to build upon functional gains made in therapy. ALL LTGS DUE 11/24/19    Baseline  pt has not been consistently performing HEP    Time  6   due to delay in scheduling, written for 4 weeks   Period  Weeks    Status  On-going    Target Date  11/24/19      PT LONG TERM GOAL #2   Title  Pt will improve mini BEST score to at least a 22/28 in order to demo decr fall risk.    Baseline  20/28 on 10/13/19    Status  Revised      PT LONG TERM GOAL #3   Title  Pt will perform posterior push and release in 2 steps or less with no LOB in order to demonstrate improved stepping strategies for balance.    Baseline  --    Status  New      PT LONG TERM GOAL #4   Title  Pt will improve TUG and TUG cognitive scores to </= 10% difference of each other, for improved dual tasking with gait.    Baseline  TUG 9.5 seconds, cog TUG 12.03 seconds    Status  On-going            Plan - 11/12/19 1656    Clinical Impression Statement  Performed standing PWR! moves today with intermittent UE support. Pt needing demo and verbal cues for proper technique. Needed intermittent rest breaks in between. Remainder of session focused on standing balance strategies on compliant surfaces and weigh shifting. Will continue to progress towards LTGs.    Personal Factors and Comorbidities  Comorbidity 3+;Time since onset of injury/illness/exacerbation    Comorbidities  diabetes, HTN, HLD, hypothyroid, hx of CVA (2019)    Examination-Activity Limitations  Stairs;Locomotion Level    Examination-Participation Restrictions  Community Activity    Stability/Clinical Decision Making  Stable/Uncomplicated    Rehab Potential  Excellent    PT Frequency   1x / week    PT Duration  4 weeks    PT Treatment/Interventions  Gait training;Stair training;Therapeutic activities;Patient/family education;Neuromuscular re-education;Balance training;Therapeutic exercise    PT Next Visit Plan  check LTGs - review HEP. re-cert? stepping strategies on compliant surfaces, standing PWR moves. GAIT/WALKING PROGRAM. turns,  dual tasking, stair training.    PT Home Exercise Plan  plus seated PWR moves    Consulted and Agree with Plan of Care  Patient       Patient will benefit from skilled therapeutic intervention in order to improve the following deficits and impairments:  Abnormal gait, Decreased activity tolerance, Decreased balance, Decreased range of motion, Difficulty walking, Postural dysfunction  Visit Diagnosis: Unsteadiness on feet  Other abnormalities of gait and mobility  Abnormal posture     Problem List Patient Active Problem List   Diagnosis Date Noted  . Mild neurocognitive disorder, unclear etiology 09/24/2019  . Parkinson's disease 07/31/2017  . 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 Fall   . Multiple system atrophy   . 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 Postural dizziness with near syncope 07/14/2017  . Postural hypotension 01/22/2017  . Gastroesophageal reflux disease 04/13/2016  . Cough 04/13/2016  . 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 Essential hypertension 11/29/2015  . Type 2 diabetes mellitus without complication, without long-term current use of insulin ([place]) 11/29/2015    Arliss Journey, PT, DPT  11/12/2019, 4:58 PM  [facility] [address] [place], [place], [address] Phone: [phone]   Fax:  [phone]  Name: Justin Gomez MRN: [id number] Date of Birth: 09-20-1940

## 2019-11-14 ENCOUNTER — Ambulatory Visit: Payer: HMO | Admitting: Physical Therapy

## 2019-11-20 ENCOUNTER — Ambulatory Visit: Payer: HMO | Attending: Neurology | Admitting: Physical Therapy

## 2019-11-20 ENCOUNTER — Other Ambulatory Visit: Payer: Self-pay

## 2019-11-20 ENCOUNTER — Encounter: Payer: Self-pay | Admitting: Physical Therapy

## 2019-11-20 DIAGNOSIS — R293 Abnormal posture: Secondary | ICD-10-CM | POA: Diagnosis not present

## 2019-11-20 DIAGNOSIS — R2689 Other abnormalities of gait and mobility: Secondary | ICD-10-CM | POA: Insufficient documentation

## 2019-11-20 DIAGNOSIS — R2681 Unsteadiness on feet: Secondary | ICD-10-CM | POA: Diagnosis not present

## 2019-11-21 NOTE — Therapy (Signed)
[facility] [address] [place], [place], [address] Phone: [phone]   Fax:  [phone]  Physical Therapy Treatment/10th Visit Progress Note/Re-Cert  Patient Details  Name: Justin Gomez MRN: [id number] Date of Birth: 01-15-1941 Referring Provider (PT): Alonza Bogus, DO  10th Visit Physical Therapy Progress Note  Dates of Reporting Period: 08/27/19 to 11/20/19    Encounter Date: 11/20/2019  PT End of Session - 11/21/19 0803    Visit Number  10    Number of Visits  12    Date for PT Re-Evaluation  12/21/19    Authorization Type  [employer]    PT Start Time  1400    PT Stop Time  1445    PT Time Calculation (min)  45 min    Equipment Utilized During Treatment  Gait belt    Activity Tolerance  Patient tolerated treatment well    Behavior During Therapy  [facility] for tasks assessed/performed       Past Medical History:  Diagnosis Date  . BPH (benign prostatic hypertrophy)   . 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 Diverticulosis   . Essential hypertension 11/29/2015  . Gastroesophageal reflux disease 04/13/2016  . Hiatal hernia   . Hyperlipidemia   . Hypothyroidism   . Lumbar herniated disc    L4  . Mild neurocognitive disorder, unclear etiology 09/24/2019  . Multiple system atrophy   . Parkinson's disease 07/31/2017  . Postural dizziness with near syncope 07/14/2017  . Postural hypotension 01/22/2017  . 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 Type 2 diabetes mellitus without complication, without long-term current use of insulin ([place]) 11/29/2015    Past Surgical History:  Procedure Laterality Date  . APPENDECTOMY  1956  . KNEE SURGERY  2009   right  . SHOULDER SURGERY  2010   left    There were no vitals filed for this visit.  Subjective Assessment - 11/20/19 1403    Subjective  Brought in his exercises today.    Pertinent History  PMH: diabetes,  HTN, HLD, hypothyroid, PD (diagonosed in 2019), hx of CVA (06/2017) - small cva left parietal and left subdural hematoma 56m in diameter.    Patient Stated Goals  wants to slow the progression of PD    Currently in Pain?  No/denies         [facility]      Mini-BESTest   Sit To Stand  Normal: Comes to stand without use of hands and stabilizes independently.    Rise to Toes  Normal: Stable for 3 s with maximum height.    Stand on one leg (left)  Moderate: < 20 s    Stand on one leg (right)  Moderate: < 20 s    Stand on one leg - lowest score  1    Compensatory Stepping Correction - Forward  Moderate: More than one step is required to recover equilibrium    Compensatory Stepping Correction - Backward  Moderate: More than one step is required to recover equilibrium    Compensatory Stepping Correction - Left Lateral  Moderate: Several steps to recover equilibrium    Compensatory Stepping Correction - Right Lateral  Moderate: Several steps to recover equilibrium    Stepping Corredtion Lateral - lowest score  1    Stance - Feet together, eyes open, firm surface   Normal: 30s    Stance - Feet together, eyes closed, foam surface  Moderate: < 30s   14 seconds   Incline - Eyes Closed  Normal: Stands independently 30s and aligns with gravity    Change in Gait Speed  Normal: Significantly changes walkling speed without imbalance    Walk with head turns - Horizontal  Moderate: performs head turns with reduction in gait speed.    Walk with pivot turns  Moderate:Turns with feet close SLOW (>4 steps) with good balance.    Step over obstacles  Normal: Able to step over box with minimal change of gait speed and with good balance.    Timed UP & GO with Dual Task  Moderate: Dual Task affects either counting OR walking (>10%) when compared to the TUG without Dual Task.    Mini-BEST total score  20      Timed Up and Go Test   Normal TUG (seconds)  10.94    Cognitive TUG (seconds)   13.25         Access Code: [email or web address] URL: [email or web address] Date: 10/07/2019 Prepared by: Janann August  Reviewed pt's HEP:   Exercises Seated Hamstring Stretch - 1 x daily - 5 x weekly - 3 sets - 20-30 hold Alternating Backward Step - 1 x daily - 5 x weekly - 2 sets - 10 reps Side Stepping with Counter Support - 1 x daily - 5 x weekly - 2 sets - 10 reps (lateral stepping strategy with weight shift).    Plus seated PWR! Up, Twist, Step and standing PWR! Rock at [employer]   At beginning of session, pt reporting he has been performing each of his exercises daily. However, when reviewing pt needing verbal and demo cues on how to perform even with pt bringing in his handouts. When reviewing, pt reports he has not been performing some of the exercises at home. Educated pt on importance of performing in order for carry over with therapy (as pt reports he feels much better after therapy), to improve larger motions, and to assist with balance/posture. Discussed with pt continuing for an additional 2 visits to finalize HEP and to have pt's wife come to a session in order to review HEP for incr compliance at home, pt verbalized understanding.                  PT Education - 11/21/19 0802    Education Details  progress towards goals, reviewing HEP, importance of performing HEP, continuining for an additional 1x week for 2 weeks to finalize HEP    Person(s) Educated  Patient    Methods  Explanation;Demonstration;Verbal cues    Comprehension  Verbalized understanding;Returned demonstration;Need further instruction       PT Short Term Goals - 08/27/19 1409      PT SHORT TERM GOAL #1   Title  all STGs = LTGs        PT Long Term Goals - 11/20/19 1419      PT LONG TERM GOAL #1   Title  Pt will be independent with final HEP and community fitness program in order to build upon functional gains made in therapy. ALL LTGS DUE 11/24/19    Baseline  pt has not  been consistently performing HEP    Time  6   due to delay in scheduling, written for 4 weeks   Period  Weeks    Status  Not Met      PT LONG TERM GOAL #2   Title  Pt will improve mini BEST score to at least a 22/28  in order to demo decr fall risk.    Baseline  20/28 on 11/20/19, no change from 20/28 on 09/23/19    Status  Not Met      PT LONG TERM GOAL #3   Title  Pt will perform posterior push and release in 2 steps or less with no LOB in order to demonstrate improved stepping strategies for balance.    Baseline  3 steps    Status  Not Met      PT LONG TERM GOAL #4   Title  Pt will improve TUG and TUG cognitive scores to </= 10% difference of each other, for improved dual tasking with gait.    Baseline  TUG 9.5 seconds, cog TUG 12.03 seconds    Status  Not Met        LTGs for re-cert:    PT Long Term Goals - 11/21/19 0814      PT LONG TERM GOAL #1   Title  Pt will be independent with final HEP and community fitness program in order to build upon functional gains made in therapy. ALL LTGS DUE 12/12/19    Baseline  pt has not been consistently performing HEP    Time  3   due to delay in scheduling, written for 2 weeks   Period  Weeks    Status  On-going    Target Date  12/12/19      PT LONG TERM GOAL #2   Title  Pt will perform posterior push and release in 2 steps or less with no LOB in order to demonstrate improved stepping strategies for balance.    Baseline  3 steps    Time  3    Period  Weeks    Status  On-going      PT LONG TERM GOAL #3   Baseline  --    Status  On-going           Plan - 11/21/19 0812    Clinical Impression Statement  Focus of today's skilled session was assessing pt's LTGs. Pt did not meet 1 out of 4 LTGs. Pt's mini BEST score did not change from a 20/28, pt with incr difficulty with stepping strategies in posterior, anterior, and lateral directions - pt requiring multiple small steps to keep balance. Pt did not meet LTG #4 in regards to TUG  and cog TUG, indicating difficulties with dual tasking. Reviewed pt's HEP and pt has not been performing some exercises and home or consistently. Discussed importance of performing at home to improve mobility and balance and to focus on larger movements/intensity for PD specific exercises. Discussed having pt's wife come in for a future session to review HEP with pt for incr compliance. Pt in agreement to re-cert for an additional 1x week for 2 weeks (due to wife's schedule, pt only able to come in 1x/week) in order to finalize and review HEP.    Personal Factors and Comorbidities  Comorbidity 3+;Time since onset of injury/illness/exacerbation    Comorbidities  diabetes, HTN, HLD, hypothyroid, hx of CVA (2019)    Examination-Activity Limitations  Stairs;Locomotion Level    Examination-Participation Restrictions  Community Activity    Stability/Clinical Decision Making  Stable/Uncomplicated    Rehab Potential  Excellent    PT Frequency  1x / week    PT Duration  2 weeks    PT Treatment/Interventions  Gait training;Stair training;Therapeutic activities;Patient/family education;Neuromuscular re-education;Balance training;Therapeutic exercise    PT Next Visit Plan  review HEP, stepping strategies,  standing PWR    PT Home Exercise Plan  plus seated PWR moves    Consulted and Agree with Plan of Care  Patient       Patient will benefit from skilled therapeutic intervention in order to improve the following deficits and impairments:  Abnormal gait, Decreased activity tolerance, Decreased balance, Decreased range of motion, Difficulty walking, Postural dysfunction  Visit Diagnosis: Unsteadiness on feet  Other abnormalities of gait and mobility  Abnormal posture     Problem List Patient Active Problem List   Diagnosis Date Noted  . Mild neurocognitive disorder, unclear etiology 09/24/2019  . Parkinson's disease 07/31/2017  . 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 Fall   .  Multiple system atrophy   . 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 Postural dizziness with near syncope 07/14/2017  . Postural hypotension 01/22/2017  . Gastroesophageal reflux disease 04/13/2016  . Cough 04/13/2016  . 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 Essential hypertension 11/29/2015  . Type 2 diabetes mellitus without complication, without long-term current use of insulin ([place]) 11/29/2015    Arliss Journey, PT, DPT  11/21/2019, 8:13 AM  [facility] [address] [place], [place], [address] Phone: [phone]   Fax:  [phone]  Name: CORDARRELL SANE MRN: [id number] Date of Birth: Apr 23, 1941

## 2019-11-27 ENCOUNTER — Ambulatory Visit: Payer: HMO | Admitting: Physical Therapy

## 2019-11-28 ENCOUNTER — Ambulatory Visit: Payer: HMO | Admitting: Physical Therapy

## 2019-11-28 ENCOUNTER — Other Ambulatory Visit: Payer: Self-pay

## 2019-11-28 DIAGNOSIS — R2689 Other abnormalities of gait and mobility: Secondary | ICD-10-CM

## 2019-11-28 DIAGNOSIS — R2681 Unsteadiness on feet: Secondary | ICD-10-CM | POA: Diagnosis not present

## 2019-11-28 NOTE — Therapy (Signed)
[facility] [address] [place], [place], [address] Phone: [phone]   Fax:  [phone]  Physical Therapy Treatment  Patient Details  Name: Justin Gomez MRN: [id number] Date of Birth: 1941-03-20 Referring Provider (PT): Alonza Bogus, DO   Encounter Date: 11/28/2019   PT End of Session - 11/28/19 1622    Visit Number 11    Number of Visits 12    Date for PT Re-Evaluation 12/21/19    Authorization Type [employer]    PT Start Time 0935    PT Stop Time 1015    PT Time Calculation (min) 40 min    Equipment Utilized During Treatment Gait belt    Activity Tolerance Patient tolerated treatment well    Behavior During Therapy [facility] for tasks assessed/performed           Past Medical History:  Diagnosis Date  . BPH (benign prostatic hypertrophy)   . 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 Diverticulosis   . Essential hypertension 11/29/2015  . Gastroesophageal reflux disease 04/13/2016  . Hiatal hernia   . Hyperlipidemia   . Hypothyroidism   . Lumbar herniated disc    L4  . Mild neurocognitive disorder, unclear etiology 09/24/2019  . Multiple system atrophy   . Parkinson's disease 07/31/2017  . Postural dizziness with near syncope 07/14/2017  . Postural hypotension 01/22/2017  . 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 Type 2 diabetes mellitus without complication, without long-term current use of insulin ([place]) 11/29/2015    Past Surgical History:  Procedure Laterality Date  . APPENDECTOMY  1956  . KNEE SURGERY  2009   right  . SHOULDER SURGERY  2010   left    There were no vitals filed for this visit.   Subjective Assessment - 11/28/19 0940    Subjective Should do my exercises, but I don't do them as much.    Patient is accompained by: Family member   wife   Pertinent History PMH: diabetes, HTN, HLD, hypothyroid, PD (diagonosed in 2019), hx of CVA  (06/2017) - small cva left parietal and left subdural hematoma 45mm in diameter.    Patient Stated Goals wants to slow the progression of PD    Currently in Pain? No/denies                             [facility] Adult PT Treatment/Exercise - 11/28/19 0001      Self-Care   Self-Care Other Self-Care Comments    Other Self-Care Comments  Discussed importance of consistency of performance of his HEP.  Wife present and discussed how to fit the exercises into his daily routine.  Also discussed use of exercise chart to track or remind of need to do exercises.  Educated wife on how she can cue/remind pt about performing HEP daily.      Neuro Re-ed    Neuro Re-ed Details  Worked on additional exercises to address balance recovery in posterior direction.  Posterior step and weightshift with BUE>1 UE support>no UE support, with PT providing perturbations in posterior direction.  PT maintains min guard/supervision for safety, with cues for widened BOS upon return to midline.  Forward/back step and weightshift x 10 reps each leg with UE support.  Forward/back walking along counter, cues for posture/step length, with quick stop/starts at counter for improved balance in posterior direction.  Reviewed HEP: Seated Hamstring Stretch - 1 x daily - 5 x weekly - 3 sets - 20-30 hold Alternating Backward Step - 1 x daily - 5 x weekly - 2 sets - 10 reps (Pt requires cues for technique) Side Stepping with Counter Support - 1 x daily - 5 x weekly - 2 sets - 10 reps (Pt requires cues for technique)  Also performed seated PWR! Moves, x 10 reps each-with VCs and visual cues/tactile cues for technique Standing lateral weigthshifting with reaching, at counter, x 10 reps each side  Answered any questions pt/wife had about frequency and duration of performance of HEP          PT Education - 11/28/19 1621    Education Details use of exercise chart to track/remind of daily performance of HEP;  wife present for instruction/review of HEP    Person(s) Educated Patient;Spouse    Methods Explanation;Demonstration;Handout;Verbal cues    Comprehension Verbalized understanding;Returned demonstration;Verbal cues required            PT Short Term Goals - 08/27/19 1409      PT SHORT TERM GOAL #1   Title all STGs = LTGs             PT Long Term Goals - 11/21/19 2025      PT LONG TERM GOAL #1   Title Pt will be independent with final HEP and community fitness program in order to build upon functional gains made in therapy. ALL LTGS DUE 12/12/19    Baseline pt has not been consistently performing HEP    Time 3   due to delay in scheduling, written for 2 weeks   Period Weeks    Status On-going    Target Date 12/12/19      PT LONG TERM GOAL #2   Title Pt will perform posterior push and release in 2 steps or less with no LOB in order to demonstrate improved stepping strategies for balance.    Baseline 3 steps    Time 3    Period Weeks    Status On-going      PT LONG TERM GOAL #3   Baseline --    Status On-going                 Plan - 11/28/19 1622    Clinical Impression Statement Wife present for today's session, with review of full HEP and instructions on how to improve compliance of HEP.  Pt needs cues throughout exercises for technique and wife verbalizes understanding, does not have questions.  Plan to review any additional questions regarding HEP next visit.    Personal Factors and Comorbidities Comorbidity 3+;Time since onset of injury/illness/exacerbation    Comorbidities diabetes, HTN, HLD, hypothyroid, hx of CVA (2019)    Examination-Activity Limitations Stairs;Locomotion Level    Examination-Participation Restrictions Community Activity    Stability/Clinical Decision Making Stable/Uncomplicated    Rehab Potential Excellent    PT Frequency 1x / week    PT Duration 2 weeks    PT Treatment/Interventions Gait training;Stair training;Therapeutic  activities;Patient/family education;Neuromuscular re-education;Balance training;Therapeutic exercise    PT Next Visit Plan How many times did pt do HEP since 6/11?  (we discussed/reiterated daily); stepping strategies, standing PWR! and ?assess LTGs    PT Home Exercise Plan plus seated PWR moves    Consulted and Agree with Plan of Care Patient           Patient will benefit from skilled therapeutic intervention in order to improve  the following deficits and impairments:  Abnormal gait, Decreased activity tolerance, Decreased balance, Decreased range of motion, Difficulty walking, Postural dysfunction  Visit Diagnosis: Unsteadiness on feet  Other abnormalities of gait and mobility     Problem List Patient Active Problem List   Diagnosis Date Noted  . Mild neurocognitive disorder, unclear etiology 09/24/2019  . Parkinson's disease 07/31/2017  . 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 Fall   . Multiple system atrophy   . 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 Postural dizziness with near syncope 07/14/2017  . Postural hypotension 01/22/2017  . Gastroesophageal reflux disease 04/13/2016  . Cough 04/13/2016  . 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 Essential hypertension 11/29/2015  . Type 2 diabetes mellitus without complication, without long-term current use of insulin ([place]) 11/29/2015    Sharnelle Cappelli W. 11/28/2019, 4:26 PM  Frazier Butt., PT   [facility] [address] [place], [place], [address] Phone: [phone]   Fax:  [phone]  Name: Justin Gomez MRN: [id number] Date of Birth: Oct 11, 1940

## 2019-11-28 NOTE — Patient Instructions (Signed)
(  Exercise) Monday Tuesday Wednesday Thursday Friday Saturday Sunday   Seated PWR! Moves -PWR! Up -PWR! Twist -PWR! Step           Standing Exercises -PWR! Rock -Step back -Step side                      Seated hamstring stretch

## 2019-12-04 ENCOUNTER — Other Ambulatory Visit: Payer: Self-pay

## 2019-12-04 ENCOUNTER — Ambulatory Visit: Payer: HMO | Admitting: Physical Therapy

## 2019-12-04 DIAGNOSIS — R293 Abnormal posture: Secondary | ICD-10-CM

## 2019-12-04 DIAGNOSIS — R2689 Other abnormalities of gait and mobility: Secondary | ICD-10-CM

## 2019-12-04 DIAGNOSIS — R2681 Unsteadiness on feet: Secondary | ICD-10-CM

## 2019-12-04 NOTE — Therapy (Signed)
[facility] [address] [place], [place], [address] Phone: [phone]   Fax:  [phone]  Physical Therapy Treatment/Discharge Summary  Patient Details  Name: Justin Gomez MRN: [id number] Date of Birth: 25-Oct-1940 Referring Provider (PT): Alonza Bogus, DO   Encounter Date: 12/04/2019   PT End of Session - 12/04/19 1214    Visit Number 12    Number of Visits 12    Date for PT Re-Evaluation 12/21/19    Authorization Type [employer]    PT Start Time 1104    PT Stop Time 1144    PT Time Calculation (min) 40 min    Activity Tolerance Patient tolerated treatment well    Behavior During Therapy [facility] for tasks assessed/performed           Past Medical History:  Diagnosis Date  . BPH (benign prostatic hypertrophy)   . 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 Diverticulosis   . Essential hypertension 11/29/2015  . Gastroesophageal reflux disease 04/13/2016  . Hiatal hernia   . Hyperlipidemia   . Hypothyroidism   . Lumbar herniated disc    L4  . Mild neurocognitive disorder, unclear etiology 09/24/2019  . Multiple system atrophy   . Parkinson's disease 07/31/2017  . Postural dizziness with near syncope 07/14/2017  . Postural hypotension 01/22/2017  . 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 Type 2 diabetes mellitus without complication, without long-term current use of insulin ([place]) 11/29/2015    Past Surgical History:  Procedure Laterality Date  . APPENDECTOMY  1956  . KNEE SURGERY  2009   right  . SHOULDER SURGERY  2010   left    There were no vitals filed for this visit.   Subjective Assessment - 12/04/19 1107    Subjective Has done the exercises 3 times since he was last here. Has not marked off when he has performed them at home on his exercise chart. Has days when he is really wiped out and doesn't have energy to do them (per wife). Has been  trying to time his exercises with his pills. Still doing the cycling classes one time per week.    Patient is accompained by: Family member   wife   Pertinent History PMH: diabetes, HTN, HLD, hypothyroid, PD (diagonosed in 2019), hx of CVA (06/2017) - small cva left parietal and left subdural hematoma 52m in diameter.    Patient Stated Goals wants to slow the progression of PD    Currently in Pain? No/denies                    Reviewed entirety of HEP: Seated Hamstring Stretch - 1 x daily - 5 x weekly - 3 sets - 20-30 hold Alternating Backward Step - 1 x daily - 5 x weekly - 2 sets - 10 reps (Pt requires cues for technique)  Side Stepping with Counter Support - 1 x daily - 5 x weekly - 2 sets - 10 reps (Pt requires cues for technique)  Needed cues for posture with stepping exercises, discussed with pt and pt's spouse of use of a sticky note at counter as visual cue to look up for exercises for improved posture.   Also performed seated PWR! Moves (Up, Twist, Step), 2 x 10 reps each-with VCs and visual cues/tactile cues for technique and incr intensity  Standing lateral weigthshifting with reaching, at counter, 2 x 10 reps each side  [facility] Adult PT Treatment/Exercise - 12/04/19 0001      Therapeutic Activites    Therapeutic Activities Other Therapeutic Activities    Other Therapeutic Activities Continued to discuss importance of performing HEP and consistency of performing with pt and pt's spouse. Reviewed pt's exercise tracker and discussed which exercises go in which boxes as a reminder to do them. Discussed scheduling pt for a PT eval in 6 months for follow up.       Neuro Re-ed    Neuro Re-ed Details  performed posterior release test x2 reps - pt taking 2 steps today to regain balance                  PT Education - 12/04/19 1213    Education Details reviewed exercise chart to track exercises for performing HEP daily, reviewed HEP with pt and pt's spouse  and provided appropriate cues as needed, discussed D/C from PT and following up for another PT eval in 6 months    Person(s) Educated Patient;Spouse    Methods Explanation;Demonstration;Verbal cues    Comprehension Verbalized understanding;Returned demonstration;Verbal cues required            PT Short Term Goals - 08/27/19 1409      PT SHORT TERM GOAL #1   Title all STGs = LTGs             PT Long Term Goals - 12/04/19 1215      PT LONG TERM GOAL #1   Title Pt will be independent with final HEP and community fitness program in order to build upon functional gains made in therapy. ALL LTGS DUE 12/12/19    Baseline reviewed with pt and pt's spouse    Time 3   due to delay in scheduling, written for 2 weeks   Period Weeks    Status Achieved      PT LONG TERM GOAL #2   Title Pt will perform posterior push and release in 2 steps or less with no LOB in order to demonstrate improved stepping strategies for balance.    Baseline performed in 2 steps on 12/04/19    Time 3    Period Weeks    Status Achieved      PT LONG TERM GOAL #3   Status On-going             PHYSICAL THERAPY DISCHARGE SUMMARY  Visits from Start of Care: 12  Current functional level related to goals / functional outcomes: See LTGs.   Remaining deficits: Postural abnormalities, decr arm swing and stride length with gait, decr dynamic standing balance.   Education / Equipment: HEP  Plan: Patient agrees to discharge.  Patient goals were met. Patient is being discharged due to meeting the stated rehab goals.  ?????          Plan - 12/04/19 1221    Clinical Impression Statement Pt's wife present during today's session. Reviewed with pt and pt's spouse entirety of HEP and reviewed exercise tracker. Provided wife with proper cues for pt while performing exercises. Discussed about having a follow up PT eval in 6 months with both verbalizing understanding. Continued to educate importance of performing  HEP daily. Answered all appropriate questions. Pt did have improvement today of posterior release test - able to perform in 2 steps today with 2 reps vs. 3 steps. Pt will be discharged from PT at this time and will be followed up with again in 6 months for eval.  Personal Factors and Comorbidities Comorbidity 3+;Time since onset of injury/illness/exacerbation    Comorbidities diabetes, HTN, HLD, hypothyroid, hx of CVA (2019)    Examination-Activity Limitations Stairs;Locomotion Level    Examination-Participation Restrictions Community Activity    Stability/Clinical Decision Making Stable/Uncomplicated    Rehab Potential Excellent    PT Frequency 1x / week    PT Duration 2 weeks    PT Treatment/Interventions Gait training;Stair training;Therapeutic activities;Patient/family education;Neuromuscular re-education;Balance training;Therapeutic exercise    PT Next Visit Plan D/C    PT Home Exercise Plan plus seated PWR moves    Consulted and Agree with Plan of Care Patient           Patient will benefit from skilled therapeutic intervention in order to improve the following deficits and impairments:  Abnormal gait, Decreased activity tolerance, Decreased balance, Decreased range of motion, Difficulty walking, Postural dysfunction  Visit Diagnosis: Other abnormalities of gait and mobility  Unsteadiness on feet  Abnormal posture     Problem List Patient Active Problem List   Diagnosis Date Noted  . Mild neurocognitive disorder, unclear etiology 09/24/2019  . Parkinson's disease 07/31/2017  . 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 Fall   . Multiple system atrophy   . 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 Postural dizziness with near syncope 07/14/2017  . Postural hypotension 01/22/2017  . Gastroesophageal reflux disease 04/13/2016  . Cough 04/13/2016  . 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 Essential hypertension 11/29/2015  . Type  2 diabetes mellitus without complication, without long-term current use of insulin ([place]) 11/29/2015    Arliss Journey, PT, DPT  12/04/2019, 12:23 PM  [facility] [address] [place], [place], [address] Phone: [phone]   Fax:  [phone]  Name: Justin Gomez MRN: [id number] Date of Birth: [birth year]

## 2019-12-15 NOTE — Progress Notes (Signed)
Assessment/Plan:   1.  Parkinsons Disease  -Continue carbidopa/levodopa 25/100, 1.5 tablets 3 times per day  -discussed DaT scan given questions for neuropsych testing.  Declined today  -We discussed that it used to be thought that levodopa would increase risk of melanoma but now it is believed that Parkinsons itself likely increases risk of melanoma. he is to get regular skin checks. 2.  Sialorrhea  -pt states that it is "bad" but does not want Botox.  3.  Dysphagia  -Last modified barium swallow on June 26, 2019 with evidence of pharyngeal dysphagia and moderate aspiration risk.  Recommended he take medication with pure and crushed if medication was large.    -pt states that he learned a technique in ST and is doing much better  4.  MCI  -Neurocognitive testing as recommended on September 23, 2019 demonstrated MCI.  Dr. Melvyn Novas questioned whether or not the patient had features of NPH.  I am not convinced on the patient's scans that the patient has ventricular enlargement out of proportion to atrophy, but potentially slightly so.  In addition, this diagnosis is rather controversial, and likely quite rare compared to what we used to think.  Nonetheless, I did tell the patient we could certainly do a high-volume lumbar puncture and see how he feels following it.  Pt declines that. Subjective:   Justin Gomez was seen today in follow up for Parkinsons disease.  My previous records were reviewed prior to todays visit as well as outside records available to me. This patient is accompanied in the office by his spouse who supplements the history.Pt denies falls.  Pt denies lightheadedness, near syncope.  Physical therapy notes are reviewed since our last visit as well as speech therapy.  States that he learned a technique at [facility] and is no longer coughing with eating.  Patient had neurocognitive testing with Dr. Melvyn Novas on September 23, 2019.  This demonstrated mild cognitive impairment.  Dr. Melvyn Novas did question  whether or not the patient had features of NPH.  I did review the patient's old scans, including his CT head and MRI brain from January, 2019.  I really did not think that his CT brain had significant ventricular enlargement compared to atrophy, but potentially just a little bit so.  MRI of the brain done January, 2019 did show some ventricular enlargement and the radiologist stated that it was attributed to "central volume loss."  Pt denies urinary incontinence today - does have urinary frequency.    Current prescribed movement disorder medications: Carbidopa/levodopa 25/100, 1.5 tablets 3 times daily   ALLERGIES:  No Known Allergies  CURRENT MEDICATIONS:  Outpatient Encounter Medications as of 12/17/2019  Medication Sig  . aspirin 81 MG tablet Take 81 mg by mouth daily.   Marland Kitchen atorvastatin (LIPITOR) 20 MG tablet Take 1 tablet (20 mg total) by mouth daily at 6 PM.  . carbidopa-levodopa (SINEMET IR) 25-100 MG tablet Take 1.5 tablets by mouth 3 (three) times daily.  . finasteride (PROSCAR) 5 MG tablet Take 5 mg by mouth daily.  Marland Kitchen glipiZIDE (GLUCOTROL) 5 MG tablet Take 5 mg by mouth 2 (two) times daily.   Marland Kitchen levothyroxine (SYNTHROID, LEVOTHROID) 75 MCG tablet Take 75 mcg by mouth daily.  Marland Kitchen losartan (COZAAR) 25 MG tablet Take 25 mg by mouth daily.  . metFORMIN (GLUCOPHAGE-XR) 750 MG 24 hr tablet Take 750 mg by mouth 2 (two) times daily.   . mirabegron ER (MYRBETRIQ) 50 MG TB24 tablet Take 50 mg  by mouth daily.  . [DISCONTINUED] pantoprazole (PROTONIX) 40 MG tablet Take 20 mg by mouth daily. (Patient not taking: Reported on 12/17/2019)   No facility-administered encounter medications on file as of 12/17/2019.    Objective:   PHYSICAL EXAMINATION:    VITALS:   Vitals:   12/17/19 1051  Height: 5\' 11"  (1.803 m)    GEN:  The patient appears stated age and is in NAD. HEENT:  Normocephalic, atraumatic.  The mucous membranes are moist. The superficial temporal arteries are without ropiness or  tenderness. CV:  RRR Lungs:  CTAB Neck/HEME:  There are no carotid bruits bilaterally.  Neurological examination:  Orientation: The patient is alert and oriented x3. Cranial nerves: There is good facial symmetry with facial hypomimia. The speech is fluent and clear. Soft palate rises symmetrically and there is no tongue deviation. Hearing is intact to conversational tone. Sensation: Sensation is intact to light touch throughout Motor: Strength is at least antigravity x4.  Movement examination: Tone: There is normal tone in the UE/LE Abnormal movements: none Coordination:  There is mild decremation with RAM's, with finger taps on the L Gait and Station: The patient has no difficulty arising out of a deep-seated chair without the use of the hands. The patient's stride length is slightly decreased with purposeful arm swing bilaterally.    I have reviewed and interpreted the following labs independently    Chemistry      Component Value Date/Time   NA 141 07/16/2017 0221   K 3.5 07/16/2017 0221   CL 105 07/16/2017 0221   CO2 24 07/16/2017 0221   BUN 16 07/16/2017 0221   CREATININE 0.88 07/16/2017 0221      Component Value Date/Time   CALCIUM 9.0 07/16/2017 0221   ALKPHOS 67 07/16/2017 0221   AST 17 07/16/2017 0221   ALT 17 07/16/2017 0221   BILITOT 1.0 07/16/2017 0221       Lab Results  Component Value Date   WBC 11.1 (H) 07/16/2017   HGB 14.4 07/16/2017   HCT 40.9 07/16/2017   MCV 88.5 07/16/2017   PLT 140 (L) 07/16/2017    Lab Results  Component Value Date   TSH 3.832 07/16/2017     Total time spent on today's visit was 40 minutes, including both face-to-face time and nonface-to-face time.  Time included that spent on review of records (prior notes available to me/labs/imaging if pertinent), discussing treatment and goals, answering patient's questions and coordinating care.  Cc:  Jani Gravel, MD

## 2019-12-17 ENCOUNTER — Other Ambulatory Visit: Payer: Self-pay

## 2019-12-17 ENCOUNTER — Encounter: Payer: Self-pay | Admitting: Neurology

## 2019-12-17 ENCOUNTER — Telehealth: Payer: Self-pay | Admitting: Neurology

## 2019-12-17 ENCOUNTER — Ambulatory Visit: Payer: HMO | Admitting: Neurology

## 2019-12-17 VITALS — BP 107/62 | HR 84 | Ht 71.0 in | Wt 171.0 lb

## 2019-12-17 DIAGNOSIS — G20 Parkinson's disease: Secondary | ICD-10-CM | POA: Diagnosis not present

## 2019-12-17 NOTE — Telephone Encounter (Signed)
Patient called in stating he forgot to ask Dr. Carles Collet a question at his visit today. He would like to know if there are any medications she could prescribe for short term memory loss?

## 2019-12-17 NOTE — Telephone Encounter (Signed)
Advised patient that Dr Tat was out of the office for the afternoon and we would get back to him tomorrow. Patient voiced understanding.

## 2019-12-17 NOTE — Patient Instructions (Signed)
1.  We discussed that it used to be thought that levodopa would increase risk of melanoma but now it is believed that Parkinsons itself likely increases risk of melanoma. Get regular skin checks.  Call dermatology and make a follow up appointment  2.  Exercise  3.  No medication changes  The physicians and staff at [facility] are committed to providing excellent care. You may receive a survey requesting feedback about your experience at our office. We strive to receive "very good" responses to the survey questions. If you feel that your experience would prevent you from giving the office a "very good " response, please contact our office to try to remedy the situation. We may be reached at [phone]. Thank you for taking the time out of your busy day to complete the survey.

## 2019-12-18 NOTE — Telephone Encounter (Signed)
Patient directly notified and voiced understanding.

## 2019-12-18 NOTE — Telephone Encounter (Signed)
No neurocognitive testing did not indicate any evidence of dementia and there are no meds out there for MCI

## 2020-01-13 ENCOUNTER — Other Ambulatory Visit: Payer: Self-pay

## 2020-01-13 MED ORDER — CARBIDOPA-LEVODOPA 25-100 MG PO TABS: 1.5000 | ORAL_TABLET | Freq: Three times a day (TID) | ORAL | 1 refills | Status: DC

## 2020-01-13 NOTE — Telephone Encounter (Signed)
Rx(s) sent to pharmacy electronically.  

## 2020-03-13 ENCOUNTER — Other Ambulatory Visit: Payer: Self-pay | Admitting: Neurology

## 2020-04-22 ENCOUNTER — Other Ambulatory Visit: Payer: Self-pay

## 2020-04-22 NOTE — Patient Outreach (Signed)
  [facility]) Care Management Chronic Special Needs Program    04/22/2020  Name: Justin Gomez, DOB: [birth year]  MRN: [id number]   [facility] Management will continue to provide services for this member through 06/18/2020. [employer] Care Management Team will assume care 06/19/2020.   Thea Silversmith, RN, MSN, [place] [phone]

## 2020-04-29 DIAGNOSIS — E1165 Type 2 diabetes mellitus with hyperglycemia: Secondary | ICD-10-CM | POA: Diagnosis not present

## 2020-04-29 DIAGNOSIS — I10 Essential (primary) hypertension: Secondary | ICD-10-CM | POA: Diagnosis not present

## 2020-04-29 DIAGNOSIS — Z0000 Encounter for general adult medical examination without abnormal findings: Secondary | ICD-10-CM | POA: Diagnosis not present

## 2020-05-05 DIAGNOSIS — N3941 Urge incontinence: Secondary | ICD-10-CM | POA: Diagnosis not present

## 2020-05-05 DIAGNOSIS — R351 Nocturia: Secondary | ICD-10-CM | POA: Diagnosis not present

## 2020-05-06 DIAGNOSIS — G20 Parkinson's disease: Secondary | ICD-10-CM | POA: Diagnosis not present

## 2020-05-06 DIAGNOSIS — R053 Chronic cough: Secondary | ICD-10-CM | POA: Diagnosis not present

## 2020-05-06 DIAGNOSIS — I10 Essential (primary) hypertension: Secondary | ICD-10-CM | POA: Diagnosis not present

## 2020-05-06 DIAGNOSIS — K219 Gastro-esophageal reflux disease without esophagitis: Secondary | ICD-10-CM | POA: Diagnosis not present

## 2020-05-06 DIAGNOSIS — E039 Hypothyroidism, unspecified: Secondary | ICD-10-CM | POA: Diagnosis not present

## 2020-05-06 DIAGNOSIS — R351 Nocturia: Secondary | ICD-10-CM | POA: Diagnosis not present

## 2020-05-06 DIAGNOSIS — E1142 Type 2 diabetes mellitus with diabetic polyneuropathy: Secondary | ICD-10-CM | POA: Diagnosis not present

## 2020-05-06 DIAGNOSIS — R1310 Dysphagia, unspecified: Secondary | ICD-10-CM | POA: Diagnosis not present

## 2020-05-06 DIAGNOSIS — M21371 Foot drop, right foot: Secondary | ICD-10-CM | POA: Diagnosis not present

## 2020-05-06 DIAGNOSIS — E119 Type 2 diabetes mellitus without complications: Secondary | ICD-10-CM | POA: Diagnosis not present

## 2020-05-06 DIAGNOSIS — Z0000 Encounter for general adult medical examination without abnormal findings: Secondary | ICD-10-CM | POA: Diagnosis not present

## 2020-05-06 DIAGNOSIS — R32 Unspecified urinary incontinence: Secondary | ICD-10-CM | POA: Diagnosis not present

## 2020-05-31 ENCOUNTER — Telehealth: Payer: Self-pay | Admitting: Physical Therapy

## 2020-05-31 DIAGNOSIS — G20 Parkinson's disease: Secondary | ICD-10-CM

## 2020-05-31 NOTE — Telephone Encounter (Signed)
Justin Gomez is scheduled for a return PT eval later this week, as was discussed and agreed upon with patient upon previous discharge from PT.  Could you please send order via Epic for PT eval and treat?  Thank you.  Mady Haagensen, PT 05/31/20 7:44 AM Phone: [phone] Fax: [phone]

## 2020-06-03 ENCOUNTER — Other Ambulatory Visit: Payer: Self-pay

## 2020-06-03 ENCOUNTER — Ambulatory Visit: Payer: HMO | Attending: Internal Medicine | Admitting: Physical Therapy

## 2020-06-03 ENCOUNTER — Encounter: Payer: Self-pay | Admitting: Physical Therapy

## 2020-06-03 DIAGNOSIS — R2681 Unsteadiness on feet: Secondary | ICD-10-CM | POA: Diagnosis not present

## 2020-06-03 DIAGNOSIS — R2689 Other abnormalities of gait and mobility: Secondary | ICD-10-CM | POA: Diagnosis not present

## 2020-06-03 DIAGNOSIS — R293 Abnormal posture: Secondary | ICD-10-CM | POA: Insufficient documentation

## 2020-06-03 NOTE — Therapy (Signed)
[facility] [address] [place], [place], [address] Phone: [phone]   Fax:  [phone]  Physical Therapy Treatment  Patient Details  Name: Justin Gomez MRN: [id number] Date of Birth: 1940-12-01 Referring Provider (PT): Alonza Bogus, DO   Encounter Date: 06/03/2020   PT End of Session - 06/03/20 1307    Visit Number 1    Number of Visits 9    Date for PT Re-Evaluation 07/31/2020   due to holidays, delayed [id number] day cert   Authorization Type [employer]    Progress Note Due on Visit 10    PT Start Time 1107    PT Stop Time 1147    PT Time Calculation (min) 40 min    Activity Tolerance Patient tolerated treatment well    Behavior During Therapy [facility] for tasks assessed/performed;Flat affect           Past Medical History:  Diagnosis Date  . BPH (benign prostatic hypertrophy)   . 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 Diverticulosis   . Essential hypertension 11/29/2015  . Gastroesophageal reflux disease 04/13/2016  . Hiatal hernia   . Hyperlipidemia   . Hypothyroidism   . Lumbar herniated disc    L4  . Mild neurocognitive disorder, unclear etiology 09/24/2019  . Multiple system atrophy   . Parkinson's disease 07/31/2017  . Postural dizziness with near syncope 07/14/2017  . Postural hypotension 01/22/2017  . 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 Type 2 diabetes mellitus without complication, without long-term current use of insulin ([place]) 11/29/2015    Past Surgical History:  Procedure Laterality Date  . APPENDECTOMY  1956  . KNEE SURGERY  2009   right  . SHOULDER SURGERY  2010   left    There were no vitals filed for this visit.   Subjective Assessment - 06/03/20 1112    Subjective No falls in the past 6 months.  Not really doing the exercises, but I am doing the PD cycling class.  Balance is not great, but it's no different than it  was.  Just sometimes feel I have to take quick step to catch my balance.  Don't use assistive device.    Pertinent History PMH: diabetes, HTN, HLD, hypothyroid, PD (diagonosed in 2019), hx of CVA (06/2017) - small cva left parietal and left subdural hematoma 79mm in diameter.    Patient Stated Goals Not sure that he has any goals at this time.    Currently in Pain? No/denies              [facility] PT Assessment - 06/03/20 1116      Assessment   Medical Diagnosis parkinson's disease    Referring Provider (PT) Alonza Bogus, DO    Onset Date/Surgical Date 05/31/20   MD order   Prior Therapy previous therapy d/c June 2021      Precautions   Precautions Fall      Balance Screen   Has the patient fallen in the past 6 months No    Has the patient had a decrease in activity level because of a fear of falling?  No    Is the patient reluctant to leave their home because of a fear of falling?  No      Home Environment   Living Environment Private residence    Living Arrangements Spouse/significant other    Available Help at Discharge Family    Type of [place]  Home Access Stairs to enter    Entrance Stairs-Number of Steps 13    Entrance Stairs-Rails Left    Home Layout One level    Home Equipment Shower seat;Other (comment)    Additional Comments wife helps with driving, cooking. pt is vacuming and doing some cleaning      Prior Function   Level of Independence Independent    Leisure Participates in PD cycling class once per week; try to walk to post office several days per week.  Enjoys lunch-bunch with friends and church activities      Observation/Other Assessments   Focus on Therapeutic Outcomes (FOTO)  NA      Posture/Postural Control   Posture/Postural Control Postural limitations    Postural Limitations Rounded Shoulders;Forward head;Posterior pelvic tilt      ROM / Strength   AROM / PROM / Strength Strength      Strength   Overall Strength Deficits    Strength  Assessment Site Hip;Knee;Ankle    Right/Left Hip Right;Left    Right Hip Flexion 4/5    Left Hip Flexion 4/5    Right/Left Knee Right;Left    Right Knee Flexion 4/5    Right Knee Extension 5/5    Left Knee Flexion 4/5    Left Knee Extension 5/5    Right/Left Ankle Right;Left    Right Ankle Dorsiflexion 4/5    Left Ankle Dorsiflexion 4/5      Transfers   Transfers Sit to Stand;Stand to Sit    Sit to Stand 6: Modified independent (Device/Increase time);Without upper extremity assist;From chair/3-in-1    Five time sit to stand comments  11.19    Stand to Sit 6: Modified independent (Device/Increase time);Without upper extremity assist;To chair/3-in-1      Ambulation/Gait   Ambulation/Gait Yes    Ambulation/Gait Assistance 5: Supervision    Ambulation Distance (Feet) 250 Feet    Assistive device None    Gait Pattern Step-through pattern;Decreased arm swing - left;Decreased arm swing - right;Left flexed knee in stance;Right flexed knee in stance;Decreased trunk rotation;Trunk flexed;Decreased stride length    Ambulation Surface Level;Indoor    Gait velocity 9.66 sec= 3.4 ft/sec      Standardized Balance Assessment   Standardized Balance Assessment Timed Up and Go Test      Mini-BESTest   Sit To Stand Normal: Comes to stand without use of hands and stabilizes independently.    Rise to Toes Normal: Stable for 3 s with maximum height.    Stand on one leg (left) Moderate: < 20 s   3.5, 4.28   Stand on one leg (right) Moderate: < 20 s   2.78, 1.72   Stand on one leg - lowest score 1    Compensatory Stepping Correction - Forward Normal: Recovers independently with a single, large step (second realignement is allowed).    Compensatory Stepping Correction - Backward Moderate: More than one step is required to recover equilibrium    Compensatory Stepping Correction - Left Lateral Moderate: Several steps to recover equilibrium    Compensatory Stepping Correction - Right Lateral Severe:   Falls, or cannot step    Stepping Corredtion Lateral - lowest score 0    Stance - Feet together, eyes open, firm surface  Normal: 30s    Stance - Feet together, eyes closed, foam surface  Moderate: < 30s   18.88   Incline - Eyes Closed Normal: Stands independently 30s and aligns with gravity    Change in Gait Speed Normal: Significantly  changes walkling speed without imbalance    Walk with head turns - Horizontal Moderate: performs head turns with reduction in gait speed.    Walk with pivot turns Moderate:Turns with feet close SLOW (>4 steps) with good balance.    Step over obstacles Moderate: Steps over box but touches box OR displays cautious behavior by slowing gait.    Timed UP & GO with Dual Task Moderate: Dual Task affects either counting OR walking (>10%) when compared to the TUG without Dual Task.    Mini-BEST total score 19      Timed Up and Go Test   Normal TUG (seconds) 13.31    Cognitive TUG (seconds) 15.9    TUG Comments Scores >13.5-15 sec indicates increased fall risk.  Socres >10/5 difference indicate difficulty with dual tasking.                                 PT Education - 06/03/20 1307    Education Details PT eval results, POC (pt would like to talk with wife prior to scheduling), but agreeable to short course of therapy to address balance    Person(s) Educated Patient    Methods Explanation    Comprehension Verbalized understanding               PT Long Term Goals - 06/03/20 1606      PT LONG TERM GOAL #1   Title Pt will be independent with HEP and community fitness program in order to build upon functional gains made in therapy. ALL LTGS DUE 07/02/2020 (may be delayed due to holiday scheduling conflicts.    Time 4    Period Weeks      PT LONG TERM GOAL #2   Title Pt will perform posterior push and release in 2 steps or less with no LOB in order to demonstrate improved stepping strategies for balance.    Time 4    Period Weeks     Status New      PT LONG TERM GOAL #3   Title Pt will improve MiniBESTest score to at least 22/28 for decreased fall risk.    Baseline 19/28 at eval    Time 4    Period Weeks      PT LONG TERM GOAL #4   Title Pt will improve TUG and TUG cognitive scores to </= 10% difference of each other, for improved dual tasking with gait.    Baseline 13.31 sec, 15.9 sec TUG cog    Time 4    Period Weeks    Status New                 Plan - 06/03/20 1308    Clinical Impression Statement Pt presents to OPPT for follow-up eval from d/c in June 2021.  He has history of Parkinson's disease and presents with abnormal posture, decreased balance, decreased dual tasking with gait, bradykinesia, decreased timing and coordination of gait.  He demo decreased balance on compliant surfaces and requires >3 steps in posterior direction to regain balance in posterior push and release test.  He is at fall risk per MiniBESTest score of 19/28 (which is slightly less than previous score of 20/28) and has slightly slowed gait velocity and TUG measures compared to last bout of therapy.  He is limited in his community exercise participation and admits not doing HEP from therapy at home very much.  He will benefit from  short course of skilled PT to address the above stated deficits, especially balance, for improved overall mobility and decreased fall risk.    Personal Factors and Comorbidities Comorbidity 3+;Time since onset of injury/illness/exacerbation    Comorbidities diabetes, HTN, HLD, hypothyroid, hx of CVA (2019)    Examination-Activity Limitations Stairs;Locomotion Level;Stand    Examination-Participation Restrictions Community Activity;Church    Stability/Clinical Decision Making Evolving/Moderate complexity    Clinical Decision Making Moderate    Rehab Potential Good    PT Frequency 2x / week    PT Duration 4 weeks   plus eval   PT Treatment/Interventions Gait training;Stair training;Therapeutic  activities;Patient/family education;Neuromuscular re-education;Balance training;Therapeutic exercise;Functional mobility training;Manual techniques;DME Instruction;Passive range of motion    PT Next Visit Plan Review previous HEP and initiate/update as needed.  Work on balance strategies, compliant surfaces    Recommended Other Services Discussed speech therapy, as pt reports some cognitive issues and PT notes garbled sounding speech-he declines at this time.    Consulted and Agree with Plan of Care Patient           Patient will benefit from skilled therapeutic intervention in order to improve the following deficits and impairments:  Abnormal gait,Decreased activity tolerance,Decreased balance,Decreased range of motion,Difficulty walking,Postural dysfunction,Decreased mobility  Visit Diagnosis: Other abnormalities of gait and mobility  Unsteadiness on feet  Abnormal posture     Problem List Patient Active Problem List   Diagnosis Date Noted  . Mild neurocognitive disorder, unclear etiology 09/24/2019  . Parkinson's disease 07/31/2017  . 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 Fall   . Multiple system atrophy   . 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 Postural dizziness with near syncope 07/14/2017  . Postural hypotension 01/22/2017  . Gastroesophageal reflux disease 04/13/2016  . Cough 04/13/2016  . 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 Essential hypertension 11/29/2015  . Type 2 diabetes mellitus without complication, without long-term current use of insulin ([place]) 11/29/2015    Morgaine Kimball W. 06/03/2020, 4:09 PM  Frazier Butt., PT   [facility] [address] [place], [place], [address] Phone: [phone]   Fax:  [phone]  Name: Justin Gomez MRN: [id number] Date of Birth: 1941/04/03

## 2020-06-14 ENCOUNTER — Other Ambulatory Visit: Payer: Self-pay

## 2020-06-14 ENCOUNTER — Telehealth: Payer: Self-pay | Admitting: Neurology

## 2020-06-14 MED ORDER — CARBIDOPA-LEVODOPA 25-100 MG PO TABS: ORAL_TABLET | ORAL | 0 refills | Status: DC

## 2020-06-22 ENCOUNTER — Other Ambulatory Visit: Payer: Self-pay

## 2020-06-22 NOTE — Progress Notes (Signed)
Assessment/Plan:   1.  Parkinsons Disease  -Increase carbidopa/levodopa 25/100, 2 tablets 3 times per day.  -Declines high-volume lumbar puncture or DaTscan  -Patient to get regular skin checks as Parkinson's increases risk for melanoma.  -increase exercise  2.  Sialorrhea  -Patient ready for botox and we will try to get authorization  3.  MCI  -Last neurocognitive testing in April, 2021.  4.  Dysphagia  -Last modified barium swallow in January, 2021 with evidence of pharyngeal dysphagia with moderate aspiration risk.  Patient feels that he is doing much better after speech therapy  5.  Constipation  -discussed nature and pathophysiology and association with PD  -discussed importance of hydration.  Pt is to increase water intake  -pt is given a copy of the rancho recipe  -recommended daily colace  -recommended miralax prn  6.  Low blood pressure  -pt to make appt with pcp to see if needs so much losarten   Subjective:   Justin Gomez was seen today in follow up for Parkinsons disease.  My previous records were reviewed prior to todays visit as well as outside records available to me. Pt had 2 falls, both just in the last few weeks.  The issue, according to them, was that he became very constipated and it affected his gait/walking.  The falls also involved tripping over the same coffee table.  Pt states that he just has trouble going to the L. Constipation is better but admits not drinking much water.  Just started miralax.   Pt denies lightheadedness, near syncope.  No hallucinations.  Mood has been good.  Biking some for exercise but only 1 day a week and didn't do any during the holiday.  Drooling is "terrible."  Current prescribed movement disorder medications: Carbidopa/levodopa 25/100, 1.5 tablets 3 times per day   ALLERGIES:   Allergies  Allergen Reactions  . Benzonatate Anaphylaxis    Closed throat; difficulty swallowing    CURRENT MEDICATIONS:  Outpatient  Encounter Medications as of 06/24/2020  Medication Sig  . aspirin 81 MG tablet Take 81 mg by mouth daily.   Marland Kitchen atorvastatin (LIPITOR) 20 MG tablet Take 1 tablet (20 mg total) by mouth daily at 6 PM.  . carbidopa-levodopa (SINEMET IR) 25-100 MG tablet TAKE 1 & 1/2 (ONE & ONE-HALF) TABLETS BY MOUTH THREE TIMES DAILY  . finasteride (PROSCAR) 5 MG tablet Take 5 mg by mouth daily.  Marland Kitchen glipiZIDE (GLUCOTROL) 5 MG tablet Take 5 mg by mouth 2 (two) times daily.   Marland Kitchen levothyroxine (SYNTHROID, LEVOTHROID) 75 MCG tablet Take 75 mcg by mouth daily.  Marland Kitchen losartan (COZAAR) 25 MG tablet Take 25 mg by mouth daily.  . metFORMIN (GLUCOPHAGE-XR) 750 MG 24 hr tablet Take 750 mg by mouth 2 (two) times daily.   . mirabegron ER (MYRBETRIQ) 50 MG TB24 tablet Take 50 mg by mouth daily.   No facility-administered encounter medications on file as of 06/24/2020.    Objective:   PHYSICAL EXAMINATION:    VITALS:   Vitals:   06/24/20 1257  BP: (!) 100/56  Pulse: 78  SpO2: 97%  Weight: 162 lb (73.5 kg)  Height: 5\' 11"  (1.803 m)    GEN:  The patient appears stated age and is in NAD. HEENT:  Normocephalic, atraumatic.  The mucous membranes are moist. The superficial temporal arteries are without ropiness or tenderness. CV:  RRR Lungs:  CTAB Neck/HEME:  There are no carotid bruits bilaterally.  Neurological examination:  Orientation: The  patient is alert and oriented x3. Cranial nerves: There is good facial symmetry with facial hypomimia. The speech is fluent and clear. Soft palate rises symmetrically and there is no tongue deviation. Hearing is intact to conversational tone. Sensation: Sensation is intact to light touch throughout Motor: Strength is at least antigravity x4.  Movement examination: Tone: There is mild increased tone in the LUE Abnormal movements: none Coordination:  There is mild decremation with RAM's, with any form of RAMS, including alternating supination and pronation of the forearm, hand opening  and closing, finger taps, heel taps and toe taps, L>R Gait and Station: The patient has mild difficulty arising out of a deep-seated chair without the use of the hands. The patient's stride length is decreased and he is flexed at the waist and fesinates.     Total time spent on today's visit was 40 minutes, including both face-to-face time and nonface-to-face time.  Time included that spent on review of records (prior notes available to me/labs/imaging if pertinent), discussing treatment and goals, answering patient's questions and coordinating care.  Cc:  Jani Gravel, MD

## 2020-06-24 ENCOUNTER — Encounter: Payer: Self-pay | Admitting: Neurology

## 2020-06-24 ENCOUNTER — Other Ambulatory Visit: Payer: Self-pay

## 2020-06-24 ENCOUNTER — Ambulatory Visit: Payer: HMO | Admitting: Neurology

## 2020-06-24 VITALS — BP 100/56 | HR 78 | Ht 71.0 in | Wt 162.0 lb

## 2020-06-24 DIAGNOSIS — G20 Parkinson's disease: Secondary | ICD-10-CM

## 2020-06-24 DIAGNOSIS — K5901 Slow transit constipation: Secondary | ICD-10-CM

## 2020-06-24 MED ORDER — CARBIDOPA-LEVODOPA 25-100 MG PO TABS: 2.0000 | ORAL_TABLET | Freq: Three times a day (TID) | ORAL | 1 refills | Status: AC

## 2020-06-24 NOTE — Patient Instructions (Addendum)
1.  Make appt with PCP to see if you need so much losartan 2.  Increase carbidopa/levodopa 25/100, 2 tablets to 8am/noon/4pm Constipation and Parkinson's disease: 3.  I will try to get prior authorization for the botox 4. You have been referred to [facility] for therapy. They will call you directly to schedule an appointment.  Please call [phone] if you do not hear from them.      1..Rancho recipe for constipation in Parkinsons Disease:  -1 cup of unprocessed bran (need to get this at [employer], [employer] or similar type of store), 2 cups of applesauce in 1 cup of prune juice 2.  Increase fiber intake (Metamucil,vegetables) 3.  Regular, moderate exercise can be beneficial. 4.  Avoid medications causing constipation, such as medications like antacids with calcium or magnesium 5.  It's okay to take daily Miralax, and taper if stools become too loose or you experience diarrhea 6.  Stool softeners (Colace) can help with chronic constipation and I recommend you take this daily. 7.  Increase water intake.  You should be drinking 1/2 gallon of water a day as long as you have not been diagnosed with congestive heart failure or renal/kidney failure.  This is probably the single greatest thing that you can do to help your constipation.

## 2020-06-24 NOTE — Progress Notes (Addendum)
1/6- submitted BV through [facility] for Myobloc per Tat. 5000 units for Sialorrhea K11.7  1/10- Received BV; Myobloc is covered under the medical benefits. Can use SP ([facility]).   1/19- set up account with [facility] and gave verbal for the script

## 2020-07-09 ENCOUNTER — Emergency Department (HOSPITAL_COMMUNITY): Payer: HMO

## 2020-07-09 ENCOUNTER — Encounter (HOSPITAL_COMMUNITY): Payer: Self-pay

## 2020-07-09 ENCOUNTER — Observation Stay (HOSPITAL_COMMUNITY)
Admission: EM | Admit: 2020-07-09 | Discharge: 2020-07-10 | Disposition: A | Discharge status: Home / Self Care | Payer: HMO | Attending: Family Medicine | Admitting: Family Medicine

## 2020-07-09 ENCOUNTER — Inpatient Hospital Stay (HOSPITAL_COMMUNITY): Payer: HMO

## 2020-07-09 ENCOUNTER — Telehealth: Payer: Self-pay

## 2020-07-09 ENCOUNTER — Other Ambulatory Visit: Payer: Self-pay

## 2020-07-09 DIAGNOSIS — E119 Type 2 diabetes mellitus without complications: Secondary | ICD-10-CM | POA: Diagnosis not present

## 2020-07-09 DIAGNOSIS — I10 Essential (primary) hypertension: Secondary | ICD-10-CM | POA: Diagnosis not present

## 2020-07-09 DIAGNOSIS — W2209XA Striking against other stationary object, initial encounter: Secondary | ICD-10-CM | POA: Diagnosis not present

## 2020-07-09 DIAGNOSIS — Z7982 Long term (current) use of aspirin: Secondary | ICD-10-CM | POA: Insufficient documentation

## 2020-07-09 DIAGNOSIS — Z79899 Other long term (current) drug therapy: Secondary | ICD-10-CM | POA: Diagnosis not present

## 2020-07-09 DIAGNOSIS — G319 Degenerative disease of nervous system, unspecified: Secondary | ICD-10-CM | POA: Diagnosis not present

## 2020-07-09 DIAGNOSIS — I6200 Nontraumatic subdural hemorrhage, unspecified: Secondary | ICD-10-CM | POA: Diagnosis not present

## 2020-07-09 DIAGNOSIS — S065X9A Traumatic subdural hemorrhage with loss of consciousness of unspecified duration, initial encounter: Secondary | ICD-10-CM | POA: Diagnosis not present

## 2020-07-09 DIAGNOSIS — S065X0A Traumatic subdural hemorrhage without loss of consciousness, initial encounter: Secondary | ICD-10-CM | POA: Diagnosis not present

## 2020-07-09 DIAGNOSIS — Y92010 Kitchen of single-family (private) house as the place of occurrence of the external cause: Secondary | ICD-10-CM | POA: Insufficient documentation

## 2020-07-09 DIAGNOSIS — S0990XA Unspecified injury of head, initial encounter: Secondary | ICD-10-CM | POA: Diagnosis present

## 2020-07-09 DIAGNOSIS — S065XAA Traumatic subdural hemorrhage with loss of consciousness status unknown, initial encounter: Secondary | ICD-10-CM | POA: Diagnosis present

## 2020-07-09 DIAGNOSIS — G20 Parkinson's disease: Secondary | ICD-10-CM | POA: Insufficient documentation

## 2020-07-09 DIAGNOSIS — Z20822 Contact with and (suspected) exposure to covid-19: Secondary | ICD-10-CM | POA: Diagnosis not present

## 2020-07-09 DIAGNOSIS — Z23 Encounter for immunization: Secondary | ICD-10-CM | POA: Insufficient documentation

## 2020-07-09 DIAGNOSIS — E039 Hypothyroidism, unspecified: Secondary | ICD-10-CM | POA: Diagnosis not present

## 2020-07-09 DIAGNOSIS — R4781 Slurred speech: Secondary | ICD-10-CM

## 2020-07-09 DIAGNOSIS — Z7984 Long term (current) use of oral hypoglycemic drugs: Secondary | ICD-10-CM | POA: Insufficient documentation

## 2020-07-09 DIAGNOSIS — R569 Unspecified convulsions: Secondary | ICD-10-CM | POA: Diagnosis not present

## 2020-07-09 DIAGNOSIS — I6201 Nontraumatic acute subdural hemorrhage: Secondary | ICD-10-CM | POA: Diagnosis not present

## 2020-07-09 LAB — I-STAT CHEM 8, ED
BUN: 19 mg/dL (ref 8–23)
Calcium, Ion: 1.28 mmol/L (ref 1.15–1.40)
Chloride: 103 mmol/L (ref 98–111)
Creatinine, Ser: 0.7 mg/dL (ref 0.61–1.24)
Glucose, Bld: 90 mg/dL (ref 70–99)
HCT: 37 % — ABNORMAL LOW (ref 39.0–52.0)
Hemoglobin: 12.6 g/dL — ABNORMAL LOW (ref 13.0–17.0)
Potassium: 4.2 mmol/L (ref 3.5–5.1)
Sodium: 142 mmol/L (ref 135–145)
TCO2: 28 mmol/L (ref 22–32)

## 2020-07-09 LAB — COMPREHENSIVE METABOLIC PANEL
ALT: <5 U/L (ref 0–44)
AST: 15 U/L (ref 15–41)
Albumin: 4.3 g/dL (ref 3.5–5.0)
Alkaline Phosphatase: 63 U/L (ref 38–126)
Anion gap: 10 (ref 5–15)
BUN: 18 mg/dL (ref 8–23)
CO2: 27 mmol/L (ref 22–32)
Calcium: 9.5 mg/dL (ref 8.9–10.3)
Chloride: 105 mmol/L (ref 98–111)
Creatinine, Ser: 0.83 mg/dL (ref 0.61–1.24)
GFR, Estimated: >60 mL/min (ref >60)
Glucose, Bld: 95 mg/dL (ref 70–99)
Potassium: 4 mmol/L (ref 3.5–5.1)
Sodium: 142 mmol/L (ref 135–145)
Total Bilirubin: 0.6 mg/dL (ref 0.3–1.2)
Total Protein: 7.3 g/dL (ref 6.5–8.1)

## 2020-07-09 LAB — SARS CORONAVIRUS 2 BY RT PCR (HOSPITAL ORDER, PERFORMED IN ~~LOC~~ HOSPITAL LAB): SARS Coronavirus 2: NEGATIVE

## 2020-07-09 LAB — URINALYSIS, ROUTINE W REFLEX MICROSCOPIC
Bilirubin Urine: NEGATIVE
Glucose, UA: NEGATIVE mg/dL
Hgb urine dipstick: NEGATIVE
Ketones, ur: 5 mg/dL — AB
Leukocytes,Ua: NEGATIVE
Nitrite: NEGATIVE
Protein, ur: NEGATIVE mg/dL
Specific Gravity, Urine: 1.015 (ref 1.005–1.030)
pH: 5 (ref 5.0–8.0)

## 2020-07-09 LAB — DIFFERENTIAL
Abs Immature Granulocytes: 0.02 10*3/uL (ref 0.00–0.07)
Basophils Absolute: 0 10*3/uL (ref 0.0–0.1)
Basophils Relative: 1 %
Eosinophils Absolute: 0.1 10*3/uL (ref 0.0–0.5)
Eosinophils Relative: 2 %
Immature Granulocytes: 0 %
Lymphocytes Relative: 18 %
Lymphs Abs: 1 10*3/uL (ref 0.7–4.0)
Monocytes Absolute: 0.5 10*3/uL (ref 0.1–1.0)
Monocytes Relative: 8 %
Neutro Abs: 4.2 10*3/uL (ref 1.7–7.7)
Neutrophils Relative %: 71 %

## 2020-07-09 LAB — CBC
HCT: 38.1 % — ABNORMAL LOW (ref 39.0–52.0)
Hemoglobin: 12.3 g/dL — ABNORMAL LOW (ref 13.0–17.0)
MCH: 30.5 pg (ref 26.0–34.0)
MCHC: 32.3 g/dL (ref 30.0–36.0)
MCV: 94.5 fL (ref 80.0–100.0)
Platelets: 181 10*3/uL (ref 150–400)
RBC: 4.03 MIL/uL — ABNORMAL LOW (ref 4.22–5.81)
RDW: 13.9 % (ref 11.5–15.5)
WBC: 5.9 10*3/uL (ref 4.0–10.5)
nRBC: 0 % (ref 0.0–0.2)

## 2020-07-09 LAB — RAPID URINE DRUG SCREEN, HOSP PERFORMED
Amphetamines: NOT DETECTED
Barbiturates: NOT DETECTED
Benzodiazepines: NOT DETECTED
Cocaine: NOT DETECTED
Opiates: NOT DETECTED
Tetrahydrocannabinol: NOT DETECTED

## 2020-07-09 LAB — CBG MONITORING, ED: Glucose-Capillary: 95 mg/dL (ref 70–99)

## 2020-07-09 LAB — ETHANOL: Alcohol, Ethyl (B): <10 mg/dL (ref <10)

## 2020-07-09 LAB — TSH: TSH: 1.85 u[IU]/mL (ref 0.350–4.500)

## 2020-07-09 LAB — MAGNESIUM: Magnesium: 2.2 mg/dL (ref 1.7–2.4)

## 2020-07-09 LAB — PROTIME-INR
INR: 1 (ref 0.8–1.2)
Prothrombin Time: 12.8 seconds (ref 11.4–15.2)

## 2020-07-09 LAB — GLUCOSE, CAPILLARY: Glucose-Capillary: 72 mg/dL (ref 70–99)

## 2020-07-09 LAB — APTT: aPTT: 31 seconds (ref 24–36)

## 2020-07-09 MED ORDER — LOSARTAN POTASSIUM 25 MG PO TABS
25.0000 mg | ORAL_TABLET | Freq: Every day | ORAL | Status: DC
  Administered 2020-07-09 – 2020-07-10 (×2): 25 mg via ORAL
  Filled 2020-07-09 (×2): qty 1

## 2020-07-09 MED ORDER — INFLUENZA VAC A&B SA ADJ QUAD 0.5 ML IM PRSY
0.5000 mL | PREFILLED_SYRINGE | INTRAMUSCULAR | Status: AC
  Administered 2020-07-10: 0.5 mL via INTRAMUSCULAR
  Filled 2020-07-09: qty 0.5

## 2020-07-09 MED ORDER — LEVETIRACETAM 500 MG PO TABS
500.0000 mg | ORAL_TABLET | Freq: Two times a day (BID) | ORAL | Status: DC
Start: 2020-07-09 — End: 2020-07-10
  Administered 2020-07-09 – 2020-07-10 (×2): 500 mg via ORAL
  Filled 2020-07-09 (×2): qty 1

## 2020-07-09 MED ORDER — LEVETIRACETAM 500 MG PO TABS: 500.0000 mg | ORAL_TABLET | Freq: Two times a day (BID) | ORAL | Status: DC

## 2020-07-09 MED ORDER — HYDRALAZINE HCL 25 MG PO TABS: 25.0000 mg | ORAL_TABLET | Freq: Four times a day (QID) | ORAL | Status: DC | PRN

## 2020-07-09 MED ORDER — ONDANSETRON HCL 4 MG/2ML IJ SOLN: 4.0000 mg | Freq: Four times a day (QID) | INTRAMUSCULAR | Status: DC | PRN

## 2020-07-09 MED ORDER — ONDANSETRON HCL 4 MG PO TABS: 4.0000 mg | ORAL_TABLET | Freq: Four times a day (QID) | ORAL | Status: DC | PRN

## 2020-07-09 MED ORDER — LEVOTHYROXINE SODIUM 75 MCG PO TABS: 75.0000 ug | ORAL_TABLET | Freq: Every day | ORAL | Status: DC

## 2020-07-09 MED ORDER — CARBIDOPA-LEVODOPA ER 25-100 MG PO TBCR: 2.0000 | EXTENDED_RELEASE_TABLET | Freq: Once | ORAL | Status: DC

## 2020-07-09 MED ORDER — CARBIDOPA-LEVODOPA 25-100 MG PO TABS
2.0000 | ORAL_TABLET | Freq: Three times a day (TID) | ORAL | Status: DC
  Administered 2020-07-09 – 2020-07-10 (×2): 2 via ORAL
  Filled 2020-07-09: qty 2

## 2020-07-09 MED ORDER — LEVETIRACETAM IN NACL 1000 MG/100ML IV SOLN
1000.0000 mg | Freq: Once | INTRAVENOUS | Status: AC
  Administered 2020-07-09: 1000 mg via INTRAVENOUS
  Filled 2020-07-09: qty 100

## 2020-07-09 MED ORDER — ATORVASTATIN CALCIUM 20 MG PO TABS: 20.0000 mg | ORAL_TABLET | Freq: Every day | ORAL | Status: DC

## 2020-07-09 MED ORDER — LEVOTHYROXINE SODIUM 75 MCG PO TABS
75.0000 ug | ORAL_TABLET | Freq: Every day | ORAL | Status: DC
  Administered 2020-07-10: 75 ug via ORAL
  Filled 2020-07-09: qty 1

## 2020-07-09 MED ORDER — INSULIN ASPART 100 UNIT/ML ~~LOC~~ SOLN
0.0000 [IU] | Freq: Three times a day (TID) | SUBCUTANEOUS | Status: DC
  Administered 2020-07-10: 2 [IU] via SUBCUTANEOUS
  Filled 2020-07-09: qty 0.09

## 2020-07-09 MED ORDER — SODIUM CHLORIDE 0.9 % IV SOLN: INTRAVENOUS | Status: DC

## 2020-07-09 MED ORDER — HYDRALAZINE HCL 20 MG/ML IJ SOLN: 5.0000 mg | Freq: Once | INTRAMUSCULAR | Status: DC

## 2020-07-09 MED ORDER — CARBIDOPA-LEVODOPA 25-100 MG PO TABS
2.0000 | ORAL_TABLET | Freq: Once | ORAL | Status: AC
  Administered 2020-07-09: 2 via ORAL
  Filled 2020-07-09 (×2): qty 2

## 2020-07-09 NOTE — ED Notes (Signed)
Called to give report to floor nurse, they said "call back in 10 minutes the nurse is currently unavailable."

## 2020-07-09 NOTE — ED Provider Notes (Signed)
[facility] DEPT Provider Note   CSN: [id number] Arrival date & time: 07/09/20  1409     History Chief Complaint  Patient presents with  . Aphasia    Justin Gomez is a 80 y.o. male.  80 yo M with a chief complaints of slurred speech.  This was noticed this morning.  The patient woke up late and when his wife saw him about 10 AM he was having trouble forming words.  Last was seen normal last night before bed.  Had a recent head injury this week.  Denies significant headache denied one-sided weakness or numbness denied difficulty with swallowing.  No cough congestion or fever no abdominal pain.  No change in vision.  The history is provided by the patient and the spouse.  Illness Severity:  Moderate Onset quality:  Sudden Duration:  4 hours Timing:  Constant Progression:  Unchanged Chronicity:  New Associated symptoms: no abdominal pain, no chest pain, no congestion, no diarrhea, no fever, no headaches, no myalgias, no rash, no shortness of breath and no vomiting        Past Medical History:  Diagnosis Date  . BPH (benign prostatic hypertrophy)   . 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 Diverticulosis   . Essential hypertension 11/29/2015  . Gastroesophageal reflux disease 04/13/2016  . Hiatal hernia   . Hyperlipidemia   . Hypothyroidism   . Lumbar herniated disc    L4  . Mild neurocognitive disorder, unclear etiology 09/24/2019  . Multiple system atrophy   . Parkinson's disease 07/31/2017  . Postural dizziness with near syncope 07/14/2017  . Postural hypotension 01/22/2017  . 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 Type 2 diabetes mellitus without complication, without long-term current use of insulin ([place]) 11/29/2015    Patient Active Problem List   Diagnosis Date Noted  . Mild neurocognitive disorder, unclear etiology 09/24/2019  . Parkinson's disease 07/31/2017  . Cerebral thrombosis  with cerebral infarction 07/16/2017  . Fall   . Multiple system atrophy   . 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 Postural dizziness with near syncope 07/14/2017  . Postural hypotension 01/22/2017  . Gastroesophageal reflux disease 04/13/2016  . Cough 04/13/2016  . 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 Essential hypertension 11/29/2015  . Type 2 diabetes mellitus without complication, without long-term current use of insulin ([place]) 11/29/2015    Past Surgical History:  Procedure Laterality Date  . APPENDECTOMY  1956  . KNEE SURGERY  2009   right  . SHOULDER SURGERY  2010   left       Family History  Problem Relation Age of Onset  . Cerebral aneurysm Father   . Hypertension Father   . Diabetes Mother   . Hypertension Brother   . Diabetes Sister   . Hypertension Sister   . Healthy Son   . Diverticulitis Daughter   . Colon cancer Neg Hx     Social History   Tobacco Use  . Smoking status: Never Smoker  . Smokeless tobacco: Never Used  Vaping Use  . Vaping Use: Never used  Substance Use Topics  . Alcohol use: Yes    Comment: occasionally  . Drug use: No    Home Medications Prior to Admission medications   Medication Sig Start Date End Date Taking? Authorizing Provider  aspirin 81 MG tablet Take 81 mg by mouth daily.     [provider]  atorvastatin (LIPITOR) 20  MG tablet Take 1 tablet (20 mg total) by mouth daily at 6 PM. 07/16/17   Kayleen Memos, DO  carbidopa-levodopa (SINEMET IR) 25-100 MG tablet Take 2 tablets by mouth 3 (three) times daily. 06/24/20   Tat, Eustace Quail, DO  finasteride (PROSCAR) 5 MG tablet Take 5 mg by mouth daily.    [provider]  glipiZIDE (GLUCOTROL) 5 MG tablet Take 5 mg by mouth 2 (two) times daily.  11/27/15   [provider]  levothyroxine (SYNTHROID, LEVOTHROID) 75 MCG tablet Take 75 mcg by mouth daily.    [provider]  losartan (COZAAR) 25 MG tablet Take 25 mg by  mouth daily.    [provider]  metFORMIN (GLUCOPHAGE-XR) 750 MG 24 hr tablet Take 750 mg by mouth 2 (two) times daily.  11/03/15   [provider]  mirabegron ER (MYRBETRIQ) 50 MG TB24 tablet Take 50 mg by mouth daily.    [provider]    Allergies    Benzonatate  Review of Systems   Review of Systems  Constitutional: Negative for chills and fever.  HENT: Negative for congestion and facial swelling.   Eyes: Negative for discharge and visual disturbance.  Respiratory: Negative for shortness of breath.   Cardiovascular: Negative for chest pain and palpitations.  Gastrointestinal: Negative for abdominal pain, diarrhea and vomiting.  Musculoskeletal: Negative for arthralgias and myalgias.  Skin: Negative for color change and rash.  Neurological: Positive for speech difficulty. Negative for tremors, syncope and headaches.  Psychiatric/Behavioral: Negative for confusion and dysphoric mood.    Physical Exam Updated Vital Signs BP (!) 156/80   Pulse 67   Temp (!) 97.5 F (36.4 C) (Oral)   Resp 20   SpO2 100%   Physical Exam Vitals and nursing note reviewed.  Constitutional:      Appearance: He is well-developed and well-nourished.  HENT:     Head: Normocephalic and atraumatic.  Eyes:     Extraocular Movements: EOM normal.     Pupils: Pupils are equal, round, and reactive to light.  Neck:     Vascular: No JVD.  Cardiovascular:     Rate and Rhythm: Normal rate and regular rhythm.     Heart sounds: No murmur heard. No friction rub. No gallop.   Pulmonary:     Effort: No respiratory distress.     Breath sounds: No wheezing.  Abdominal:     General: There is no distension.     Tenderness: There is no guarding or rebound.  Musculoskeletal:        General: Normal range of motion.     Cervical back: Normal range of motion and neck supple.  Skin:    Coloration: Skin is not pale.     Findings: No rash.  Neurological:     Mental Status: He is  alert and oriented to person, place, and time.     Comments: Slurred speech.  Cogwheeling.  Otherwise no obvious deficit on exam.  Psychiatric:        Mood and Affect: Mood and affect normal.        Behavior: Behavior normal.     ED Results / Procedures / Treatments   Labs (all labs ordered are listed, but only abnormal results are displayed) Labs Reviewed  CBC - Abnormal; Notable for the following components:      Result Value   RBC 4.03 (*)    Hemoglobin 12.3 (*)    HCT 38.1 (*)    All  other components within normal limits  I-STAT CHEM 8, ED - Abnormal; Notable for the following components:   Hemoglobin 12.6 (*)    HCT 37.0 (*)    All other components within normal limits  ETHANOL  PROTIME-INR  APTT  DIFFERENTIAL  COMPREHENSIVE METABOLIC PANEL  RAPID URINE DRUG SCREEN, HOSP PERFORMED  URINALYSIS, ROUTINE W REFLEX MICROSCOPIC  MAGNESIUM  CBG MONITORING, ED    EKG EKG Interpretation  Date/Time:  Friday July 09 2020 14:49:45 EST Ventricular Rate:  68 PR Interval:    QRS Duration: 92 QT Interval:  396 QTC Calculation: 422 R Axis:   17 Text Interpretation: Sinus rhythm Short PR interval RSR' in V1 or V2, probably normal variant Baseline wander in lead(s) V1 No significant change since last tracing Confirmed by Deno Etienne [phone]) on 07/09/2020 3:20:57 PM   Radiology DG Chest Port 1 View  Result Date: 07/09/2020 CLINICAL DATA:  Slurred speech EXAM: PORTABLE CHEST 1 VIEW COMPARISON:  10/17/2009 FINDINGS: The heart size and mediastinal contours are within normal limits. Both lungs are clear. The visualized skeletal structures are unremarkable. IMPRESSION: No active disease. Electronically Signed   By: Kathreen Devoid   On: 07/09/2020 15:07    Procedures Procedures (including critical care time)  Medications Ordered in ED Medications - No data to display  ED Course  I have reviewed the triage vital signs and the nursing notes.  Pertinent labs & imaging results that  were available during my care of the patient were reviewed by me and considered in my medical decision making (see chart for details).    MDM Rules/Calculators/A&P                          80 yo M with a chief complaints of slurred speech.  This was noticed this morning about 10 AM.  Last seen normal yesterday.  We will obtain a stroke work-up.  CT head blood work per order set.  Chest x-ray UA to evaluate for infection.  Reassess.  Signed out to Dr. Darl Householder, please see their note for further details of care in the ED.   The patients results and plan were reviewed and discussed.   Any x-rays performed were independently reviewed by myself.   Differential diagnosis were considered with the presenting HPI.  Medications - No data to display  Vitals:   07/09/20 1420 07/09/20 1500 07/09/20 1530  BP: 134/74 (!) 150/75 (!) 156/80  Pulse: 75 66 67  Resp: 17 20 20   Temp: (!) 97.5 F (36.4 C)    TempSrc: Oral    SpO2: 100% 100% 100%    Final diagnoses:  Slurred speech      Final Clinical Impression(s) / ED Diagnoses Final diagnoses:  Slurred speech    Rx / DC Orders ED Discharge Orders    None       Deno Etienne, DO 07/09/20 1539

## 2020-07-09 NOTE — Telephone Encounter (Signed)
Sounds like he needs ER evaluation if confused after fall with speech change.

## 2020-07-09 NOTE — Consult Note (Addendum)
Chief Complaint   Chief Complaint  Patient presents with  . Aphasia    HPI   Consult requested by: Dr Darl Householder, [facility] Reason for consult: SDH  HPI: Justin Gomez is a 80 y.o. male with multiple medical comorbidities including Parkinson's and Dementia, resides at home with wife, who presented to the ED after wife noticed rather pronounced slurred speech this am. Patient was in normal state of health until today. She noticed slurred speech around 1000 today, but unsure if it was going on sooner or not because they were doing different things this am. LKN yesterday evening. Initially, wife called neurologist who recommended coming to ED for evaluation. As part of work up a CT head was ordered which revealed a small, left SDH. A NSY consultation was requested. Patient denies any symptoms including pain. He is frustrated with his speech. Denies N/T/W in extremities.   Per wife at bedside, patient fell on Monday and hit left side of head and elbow, but was able to get up and ambulate so they didn't think anything of it. He is not on any blood thinning medications.  Patient Active Problem List   Diagnosis Date Noted  . SDH (subdural hematoma) ([place]) 07/09/2020  . Mild neurocognitive disorder, unclear etiology 09/24/2019  . Parkinson's disease 07/31/2017  . 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 Fall   . Multiple system atrophy   . 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 Postural dizziness with near syncope 07/14/2017  . Postural hypotension 01/22/2017  . Gastroesophageal reflux disease 04/13/2016  . Cough 04/13/2016  . 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 Essential hypertension 11/29/2015  . Type 2 diabetes mellitus without complication, without long-term current use of insulin ([place]) 11/29/2015    PMH: Past Medical History:  Diagnosis Date  . BPH (benign prostatic hypertrophy)   . Cerebral thrombosis with cerebral infarction 07/16/2017   . 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 Diverticulosis   . Essential hypertension 11/29/2015  . Gastroesophageal reflux disease 04/13/2016  . Hiatal hernia   . Hyperlipidemia   . Hypothyroidism   . Lumbar herniated disc    L4  . Mild neurocognitive disorder, unclear etiology 09/24/2019  . Multiple system atrophy   . Parkinson's disease 07/31/2017  . Postural dizziness with near syncope 07/14/2017  . Postural hypotension 01/22/2017  . 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 Type 2 diabetes mellitus without complication, without long-term current use of insulin ([place]) 11/29/2015    PSH: Past Surgical History:  Procedure Laterality Date  . APPENDECTOMY  1956  . KNEE SURGERY  2009   right  . SHOULDER SURGERY  2010   left    (Not in a hospital admission)   SH: Social History   Tobacco Use  . Smoking status: Never Smoker  . Smokeless tobacco: Never Used  Vaping Use  . Vaping Use: Never used  Substance Use Topics  . Alcohol use: Yes    Comment: occasionally  . Drug use: No    MEDS: Prior to Admission medications   Medication Sig Start Date End Date Taking? Authorizing Provider  aspirin 81 MG tablet Take 81 mg by mouth daily.     [provider]  atorvastatin (LIPITOR) 20 MG tablet Take 1 tablet (20 mg total) by mouth daily at 6 PM. 07/16/17   Kayleen Memos, DO  carbidopa-levodopa (SINEMET IR) 25-100 MG tablet Take 2 tablets by mouth 3 (three) times daily. 06/24/20   Tat, Eustace Quail,  DO  finasteride (PROSCAR) 5 MG tablet Take 5 mg by mouth daily.    [provider]  glipiZIDE (GLUCOTROL) 5 MG tablet Take 5 mg by mouth 2 (two) times daily.  11/27/15   [provider]  levothyroxine (SYNTHROID, LEVOTHROID) 75 MCG tablet Take 75 mcg by mouth daily.    [provider]  losartan (COZAAR) 25 MG tablet Take 25 mg by mouth daily.    [provider]  metFORMIN (GLUCOPHAGE-XR) 750 MG 24 hr tablet Take 750 mg by mouth 2 (two) times daily.  11/03/15    [provider]  mirabegron ER (MYRBETRIQ) 50 MG TB24 tablet Take 50 mg by mouth daily.    [provider]    ALLERGY: Allergies  Allergen Reactions  . Benzonatate Anaphylaxis    Closed throat; difficulty swallowing    Social History   Tobacco Use  . Smoking status: Never Smoker  . Smokeless tobacco: Never Used  Substance Use Topics  . Alcohol use: Yes    Comment: occasionally     Family History  Problem Relation Age of Onset  . Cerebral aneurysm Father   . Hypertension Father   . Diabetes Mother   . Hypertension Brother   . Diabetes Sister   . Hypertension Sister   . Healthy Son   . Diverticulitis Daughter   . Colon cancer Neg Hx      ROS   Review of Systems  All other systems reviewed and are negative.   Exam   Vitals:   07/09/20 1740 07/09/20 1750  BP: (!) 174/82 (!) 180/78  Pulse: 78 86  Resp: 20 20  Temp:    SpO2: 98% 100%   General appearance: elderly male, resting comfortably  GCS 15 Eyes: No scleral injection Cardiovascular: Regular rate and rhythm without murmurs, rubs, gallops. No edema or variciosities. Distal pulses normal. Pulmonary: Effort normal, non-labored breathing Musculoskeletal:     Muscle tone upper extremities: Normal    Muscle tone lower extremities: Normal    Motor exam: Upper Extremities Deltoid Bicep Tricep Grip  Right 5/5 5/5 5/5 5/5  Left 5/5 5/5 5/5 5/5   Lower Extremity IP Quad PF DF EHL  Right 5/5 5/5 5/5 5/5 5/5  Left 5/5 5/5 5/5 5/5 5/5   Neurological Patient is awake, alert, oriented x4 PERRL Slurred speech right facial droop EOMI. Hearing intact. Shoulder shrug symmetric Sensation intact to light touch No drift    WHILE IN EXAM ROOM Patient had approximately 15 second episode of incomprehensible moaning with muscle rigidity RUE. He was unresponsive to voice during the episode. No incontinence. After episode ended patient looked confused and unable to communicate for at least 10 seconds  prior to regaining ability.  Results - Imaging/Labs   Results for orders placed or performed during the hospital encounter of 07/09/20 (from the past 48 hour(s))  Ethanol     Status: None   Collection Time: 07/09/20  2:48 PM  Result Value Ref Range   Alcohol, Ethyl (B) <10 <10 mg/dL    Comment: (NOTE) Lowest detectable limit for serum alcohol is 10 mg/dL.  For medical purposes only. Performed at [facility], [place] [address], [place], [place] [address]   Protime-INR     Status: None   Collection Time: 07/09/20  2:48 PM  Result Value Ref Range   Prothrombin Time 12.8 11.4 - 15.2 seconds   INR 1.0 0.8 - 1.2    Comment: (NOTE) INR goal varies based on device  and disease states. Performed at [facility], [place] [address], [place], [place] [address]   APTT     Status: None   Collection Time: 07/09/20  2:48 PM  Result Value Ref Range   aPTT 31 24 - 36 seconds    Comment: Performed at [facility], [place] [address] [place], [place] [address]  CBC     Status: Abnormal   Collection Time: 07/09/20  2:48 PM  Result Value Ref Range   WBC 5.9 4.0 - 10.5 K/uL   RBC 4.03 (L) 4.22 - 5.81 MIL/uL   Hemoglobin 12.3 (L) 13.0 - 17.0 g/dL   HCT 38.1 (L) 39.0 - 52.0 %   MCV 94.5 80.0 - 100.0 fL   MCH 30.5 26.0 - 34.0 pg   MCHC 32.3 30.0 - 36.0 g/dL   RDW 13.9 11.5 - 15.5 %   Platelets 181 150 - 400 K/uL   nRBC 0.0 0.0 - 0.2 %    Comment: Performed at [facility], [place] [address] [place], [place] [address]  Differential     Status: None   Collection Time: 07/09/20  2:48 PM  Result Value Ref Range   Neutrophils Relative % 71 %   Neutro Abs 4.2 1.7 - 7.7 K/uL   Lymphocytes Relative 18 %   Lymphs Abs 1.0 0.7 - 4.0 K/uL   Monocytes Relative 8 %   Monocytes Absolute 0.5 0.1 - 1.0 K/uL   Eosinophils Relative 2 %   Eosinophils Absolute 0.1 0.0 - 0.5 K/uL   Basophils Relative 1 %   Basophils Absolute 0.0 0.0 - 0.1 K/uL    Immature Granulocytes 0 %   Abs Immature Granulocytes 0.02 0.00 - 0.07 K/uL    Comment: Performed at [facility], [place] [address], [place], [place] [address]  Comprehensive metabolic panel     Status: None   Collection Time: 07/09/20  2:48 PM  Result Value Ref Range   Sodium 142 135 - 145 mmol/L   Potassium 4.0 3.5 - 5.1 mmol/L   Chloride 105 98 - 111 mmol/L   CO2 27 22 - 32 mmol/L   Glucose, Bld 95 70 - 99 mg/dL    Comment: Glucose reference range applies only to samples taken after fasting for at least 8 hours.   BUN 18 8 - 23 mg/dL   Creatinine, Ser 0.83 0.61 - 1.24 mg/dL   Calcium 9.5 8.9 - 10.3 mg/dL   Total Protein 7.3 6.5 - 8.1 g/dL   Albumin 4.3 3.5 - 5.0 g/dL   AST 15 15 - 41 U/L   ALT <5 0 - 44 U/L   Alkaline Phosphatase 63 38 - 126 U/L   Total Bilirubin 0.6 0.3 - 1.2 mg/dL   GFR, Estimated >60 >60 mL/min    Comment: (NOTE) Calculated using the CKD-EPI Creatinine Equation (2021)    Anion gap 10 5 - 15    Comment: Performed at [facility], [place] [address], [place], [place] [address]  Magnesium     Status: None   Collection Time: 07/09/20  2:48 PM  Result Value Ref Range   Magnesium 2.2 1.7 - 2.4 mg/dL    Comment: Performed at [facility], [place] [address], [place], [place] [address]  CBG monitoring, ED     Status: None   Collection Time: 07/09/20  2:49 PM  Result Value Ref Range   Glucose-Capillary 95 70 - 99 mg/dL    Comment:  Glucose reference range applies only to samples taken after fasting for at least 8 hours.  I-stat chem 8, ED     Status: Abnormal   Collection Time: 07/09/20  2:54 PM  Result Value Ref Range   Sodium 142 135 - 145 mmol/L   Potassium 4.2 3.5 - 5.1 mmol/L   Chloride 103 98 - 111 mmol/L   BUN 19 8 - 23 mg/dL   Creatinine, Ser 0.70 0.61 - 1.24 mg/dL   Glucose, Bld 90 70 - 99 mg/dL    Comment: Glucose reference range applies only to samples taken after fasting for at least 8 hours.    Calcium, Ion 1.28 1.15 - 1.40 mmol/L   TCO2 28 22 - 32 mmol/L   Hemoglobin 12.6 (L) 13.0 - 17.0 g/dL   HCT 37.0 (L) 39.0 - 52.0 %  Urine rapid drug screen (hosp performed)     Status: None   Collection Time: 07/09/20  3:58 PM  Result Value Ref Range   Opiates NONE DETECTED NONE DETECTED   Cocaine NONE DETECTED NONE DETECTED   Benzodiazepines NONE DETECTED NONE DETECTED   Amphetamines NONE DETECTED NONE DETECTED   Tetrahydrocannabinol NONE DETECTED NONE DETECTED   Barbiturates NONE DETECTED NONE DETECTED    Comment: (NOTE) DRUG SCREEN FOR MEDICAL PURPOSES ONLY.  IF CONFIRMATION IS NEEDED FOR ANY PURPOSE, NOTIFY LAB WITHIN 5 DAYS.  LOWEST DETECTABLE LIMITS FOR URINE DRUG SCREEN Drug Class                     Cutoff (ng/mL) Amphetamine and metabolites    1000 Barbiturate and metabolites    200 Benzodiazepine                 409 Tricyclics and metabolites     300 Opiates and metabolites        300 Cocaine and metabolites        300 THC                            50 Performed at [facility], [place] [address] [place], [place] [address]   Urinalysis, Routine w reflex microscopic Urine, Clean Catch     Status: Abnormal   Collection Time: 07/09/20  3:58 PM  Result Value Ref Range   Color, Urine YELLOW YELLOW   APPearance CLEAR CLEAR   Specific Gravity, Urine 1.015 1.005 - 1.030   pH 5.0 5.0 - 8.0   Glucose, UA NEGATIVE NEGATIVE mg/dL   Hgb urine dipstick NEGATIVE NEGATIVE   Bilirubin Urine NEGATIVE NEGATIVE   Ketones, ur 5 (A) NEGATIVE mg/dL   Protein, ur NEGATIVE NEGATIVE mg/dL   Nitrite NEGATIVE NEGATIVE   Leukocytes,Ua NEGATIVE NEGATIVE    Comment: Performed at [place] [address] [place], [place] [address]    CT HEAD WO CONTRAST  Result Date: 07/09/2020 CLINICAL DATA:  Slurred speech EXAM: CT HEAD WITHOUT CONTRAST CT CERVICAL SPINE WITHOUT CONTRAST TECHNIQUE: Multidetector CT imaging of the head and cervical spine was  performed following the standard protocol without intravenous contrast. Multiplanar CT image reconstructions of the cervical spine were also generated. COMPARISON:  CT brain and cervical spine 07/14/2017, MRI 07/15/2017 FINDINGS: CT HEAD FINDINGS Brain: No acute territorial infarction or intracranial mass is visualized. Acute left convexity subdural hematoma measuring up to 10 mm maximum thickness. No significant midline shift. Mild atrophy. Stable ventricle size. Vascular: No hyperdense vessels. Vertebral and carotid vascular  calcification Skull: Normal. Negative for fracture or focal lesion. Sinuses/Orbits: No acute finding. Other: None CT CERVICAL SPINE FINDINGS Alignment: Straightening of the cervical spine.  No subluxation. Skull base and vertebrae: No acute fracture. No primary bone lesion or focal pathologic process. Soft tissues and spinal canal: No prevertebral fluid or swelling. No visible canal hematoma. Disc levels: Bulky flowing anterior osteophytes C3 through T1 with ankylosis, consistent with dish. Mild disc space narrowing at C2-C3, C4-C5, C5-C6 and C6-C7. Facet degenerative changes at multiple levels. Upper chest: Minimal apical scarring. Other: None IMPRESSION: 1. Acute left convexity subdural hematoma measuring up to 10 mm maximum thickness. No significant midline shift. 2. 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 No acute osseous abnormality. Critical Value/emergent results were called by telephone at the time of interpretation on 07/09/2020 at 5:05 pm to provider DAVID YAO , who verbally acknowledged these results. Electronically Signed   By: Donavan Foil M.D.   On: 07/09/2020 17:06   CT Cervical Spine Wo Contrast  Result Date: 07/09/2020 CLINICAL DATA:  Slurred speech EXAM: CT HEAD WITHOUT CONTRAST CT CERVICAL SPINE WITHOUT CONTRAST TECHNIQUE: Multidetector CT imaging of the head and cervical spine was performed following  the standard protocol without intravenous contrast. Multiplanar CT image reconstructions of the cervical spine were also generated. COMPARISON:  CT brain and cervical spine 07/14/2017, MRI 07/15/2017 FINDINGS: CT HEAD FINDINGS Brain: No acute territorial infarction or intracranial mass is visualized. Acute left convexity subdural hematoma measuring up to 10 mm maximum thickness. No significant midline shift. Mild atrophy. Stable ventricle size. Vascular: No hyperdense vessels. Vertebral and carotid vascular calcification Skull: Normal. Negative for fracture or focal lesion. Sinuses/Orbits: No acute finding. Other: None CT CERVICAL SPINE FINDINGS Alignment: Straightening of the cervical spine.  No subluxation. Skull base and vertebrae: No acute fracture. No primary bone lesion or focal pathologic process. Soft tissues and spinal canal: No prevertebral fluid or swelling. No visible canal hematoma. Disc levels: Bulky flowing anterior osteophytes C3 through T1 with ankylosis, consistent with dish. Mild disc space narrowing at C2-C3, C4-C5, C5-C6 and C6-C7. Facet degenerative changes at multiple levels. Upper chest: Minimal apical scarring. Other: None IMPRESSION: 1. Acute left convexity subdural hematoma measuring up to 10 mm maximum thickness. No significant midline shift. 2. 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 No acute osseous abnormality. Critical Value/emergent results were called by telephone at the time of interpretation on 07/09/2020 at 5:05 pm to provider DAVID YAO , who verbally acknowledged these results. Electronically Signed   By: Donavan Foil M.D.   On: 07/09/2020 17:06   DG Chest Port 1 View  Result Date: 07/09/2020 CLINICAL DATA:  Slurred speech EXAM: PORTABLE CHEST 1 VIEW COMPARISON:  10/17/2009 FINDINGS: The heart size and mediastinal contours are within normal limits. Both lungs are clear. The visualized skeletal structures  are unremarkable. IMPRESSION: No active disease. Electronically Signed   By: Kathreen Devoid   On: 07/09/2020 15:07   Impression/Plan   80 y.o. male who presented with dysarthria who was found to have a small left SDH. With the exception of facial droop and dysarthria, he is otherwise neurologically intact. Patient to be admitted under [facility] for further workup and management. He also had a possible seizure during examination, witnessed by myself, admitting provider and nursing.  Left convexity SDH - 40mm max thickness. Minimal mass effect. No MLS - No role for NS intervention. Should improve  with time - q 2 hour neuro check, report any change - CT head tomorrow am for monitoring. Sooner as indicated by exam  ?Seizure - Loaded with Keppra 1g, 500mg  BID - EEG  Dysarthria: - Was initially still concerned regarding CVA as this SDH would likely not be the cause given how small it is. After likely witnessed seizure, seizures appear more likely as the cause. Will still get MRI brain for completeness sake.  Will follow. Please call for any concerns.  Ferne Reus, PA-C [facility] [employer]

## 2020-07-09 NOTE — ED Notes (Signed)
Called 4E to give report again, [facility] is looking into why the room has not been approved yet. Waiting for a call back/ purple man.

## 2020-07-09 NOTE — ED Notes (Signed)
MRI called and patient is next.

## 2020-07-09 NOTE — ED Notes (Signed)
Hospitalist and Neurosurgeon PA in room. Patient started screaming and not responsive to his name. After about 15 seconds patient stops and answers neurosurgeon.

## 2020-07-09 NOTE — H&P (Addendum)
TRH H&P    Patient Demographics:    Justin Gomez, is a 80 y.o. male  MRN: [id number]  DOB - 1940-07-25  Admit Date - 07/09/2020  Referring MD/NP/PA: Dr. Darl Householder  Outpatient Primary MD for the patient is Jani Gravel, MD  Patient coming from: Home  Chief complaint-slurred speech   HPI:    Justin Gomez  is a 80 y.o. male, he has history of hypertension, diabetes mellitus type 2, Parkinson disease, hypothyroidism, hyperlipidemia, GERD, BPH was brought to hospital after patient developed slurred speech around 10 AM this morning.  He was having trouble forming words.  Patient had recent head injury on Monday when he fell and hit his head on the kitchen cabinet.  There is no one-sided weakness or numbness.  Denies chest pain or shortness of breath.  Denies blurred vision.  Denies nausea vomiting or diarrhea.  Denies abdominal pain or dysuria.  In the ED CT head was obtained which showed acute left convexity subdural hematoma measuring up to 10 mm thickness.  No significant midline shift.  Neurosurgery was consulted, and they recommend patient to be admitted at [facility] and repeat head CT in a.m. While in the room,  neurosurgery PA arrived and patient started having seizure like activity.  It lasted for about 15 seconds.  1 g Keppra has been ordered as per neurosurgery.    Review of systems:    In addition to the HPI above,   All other systems reviewed and are negative.    Past History of the following :    Past Medical History:  Diagnosis Date  . BPH (benign prostatic hypertrophy)   . 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 Diverticulosis   . Essential hypertension 11/29/2015  . Gastroesophageal reflux disease 04/13/2016  . Hiatal hernia   . Hyperlipidemia   . Hypothyroidism   . Lumbar herniated disc    L4  . Mild neurocognitive  disorder, unclear etiology 09/24/2019  . Multiple system atrophy   . Parkinson's disease 07/31/2017  . Postural dizziness with near syncope 07/14/2017  . Postural hypotension 01/22/2017  . 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 Type 2 diabetes mellitus without complication, without long-term current use of insulin ([place]) 11/29/2015      Past Surgical History:  Procedure Laterality Date  . APPENDECTOMY  1956  . KNEE SURGERY  2009   right  . SHOULDER SURGERY  2010   left      Social History:      Social History   Tobacco Use  . Smoking status: Never Smoker  . Smokeless tobacco: Never Used  Substance Use Topics  . Alcohol use: Yes    Comment: occasionally       Family History :     Family History  Problem Relation Age of Onset  . Cerebral aneurysm Father   . Hypertension Father   . Diabetes Mother   . Hypertension Brother   . Diabetes Sister   . Hypertension Sister   . Healthy Son   .  Diverticulitis Daughter   . Colon cancer Neg Hx      Home Medications:   Prior to Admission medications   Medication Sig Start Date End Date Taking? Authorizing Provider  aspirin 81 MG tablet Take 81 mg by mouth daily.     [provider]  atorvastatin (LIPITOR) 20 MG tablet Take 1 tablet (20 mg total) by mouth daily at 6 PM. 07/16/17   Darlin Drop, DO  carbidopa-levodopa (SINEMET IR) 25-100 MG tablet Take 2 tablets by mouth 3 (three) times daily. 06/24/20   Tat, Octaviano Batty, DO  finasteride (PROSCAR) 5 MG tablet Take 5 mg by mouth daily.    [provider]  glipiZIDE (GLUCOTROL) 5 MG tablet Take 5 mg by mouth 2 (two) times daily.  11/27/15   [provider]  levothyroxine (SYNTHROID, LEVOTHROID) 75 MCG tablet Take 75 mcg by mouth daily.    [provider]  losartan (COZAAR) 25 MG tablet Take 25 mg by mouth daily.    [provider]  metFORMIN (GLUCOPHAGE-XR) 750 MG 24 hr tablet Take 750 mg by mouth 2 (two) times daily.  11/03/15   [provider]  mirabegron ER (MYRBETRIQ) 50 MG TB24 tablet Take 50 mg by mouth daily.    [provider]     Allergies:     Allergies  Allergen Reactions  . Benzonatate Anaphylaxis    Closed throat; difficulty swallowing     Physical Exam:   Vitals  Blood pressure (!) 180/78, pulse 86, temperature (!) 97.5 F (36.4 C), temperature source Oral, resp. rate 20, SpO2 100 %.  1.  General: Appears in no acute distress  2. Psychiatric: Alert, oriented x3, intact insight and judgment  3. Neurologic: Cranial nerves II through XII grossly intact, moving all extremities, no focal deficit noted, positive dysarthria  4. HEENMT:  Atraumatic normocephalic, extraocular muscles are intact  5. Respiratory : Clear to auscultation bilaterally, no wheezing or crackles auscultated  6. Cardiovascular : S1-S2, regular, no murmur auscultated  7. Gastrointestinal:  Abdomen is soft, nontender, no organomegaly      Data Review:    CBC Recent Labs  Lab 07/09/20 1448 07/09/20 1454  WBC 5.9  --   HGB 12.3* 12.6*  HCT 38.1* 37.0*  PLT 181  --   MCV 94.5  --   MCH 30.5  --   MCHC 32.3  --   RDW 13.9  --   LYMPHSABS 1.0  --   MONOABS 0.5  --   EOSABS 0.1  --   BASOSABS 0.0  --    ------------------------------------------------------------------------------------------------------------------  Results for orders placed or performed during the hospital encounter of 07/09/20 (from the past 48 hour(s))  Ethanol     Status: None   Collection Time: 07/09/20  2:48 PM  Result Value Ref Range   Alcohol, Ethyl (B) <10 <10 mg/dL    Comment: (NOTE) Lowest detectable limit for serum alcohol is 10 mg/dL.  For medical purposes only. Performed at [facility], [address], [place], [place] [address]   Protime-INR     Status: None   Collection Time: 07/09/20  2:48 PM  Result Value Ref Range   Prothrombin Time 12.8 11.4 - 15.2 seconds   INR 1.0 0.8 - 1.2     Comment: (NOTE) INR goal varies based on device and disease states. Performed at [facility], [address] [place], [place] [address]   APTT     Status: None  Collection Time: 07/09/20  2:48 PM  Result Value Ref Range   aPTT 31 24 - 36 seconds    Comment: Performed at [facility], [place] [address], [place], [place] [address]  CBC     Status: Abnormal   Collection Time: 07/09/20  2:48 PM  Result Value Ref Range   WBC 5.9 4.0 - 10.5 K/uL   RBC 4.03 (L) 4.22 - 5.81 MIL/uL   Hemoglobin 12.3 (L) 13.0 - 17.0 g/dL   HCT 38.1 (L) 39.0 - 52.0 %   MCV 94.5 80.0 - 100.0 fL   MCH 30.5 26.0 - 34.0 pg   MCHC 32.3 30.0 - 36.0 g/dL   RDW 13.9 11.5 - 15.5 %   Platelets 181 150 - 400 K/uL   nRBC 0.0 0.0 - 0.2 %    Comment: Performed at [facility], [place] [address], [place], [place] [address]  Differential     Status: None   Collection Time: 07/09/20  2:48 PM  Result Value Ref Range   Neutrophils Relative % 71 %   Neutro Abs 4.2 1.7 - 7.7 K/uL   Lymphocytes Relative 18 %   Lymphs Abs 1.0 0.7 - 4.0 K/uL   Monocytes Relative 8 %   Monocytes Absolute 0.5 0.1 - 1.0 K/uL   Eosinophils Relative 2 %   Eosinophils Absolute 0.1 0.0 - 0.5 K/uL   Basophils Relative 1 %   Basophils Absolute 0.0 0.0 - 0.1 K/uL   Immature Granulocytes 0 %   Abs Immature Granulocytes 0.02 0.00 - 0.07 K/uL    Comment: Performed at [facility], [place] [address] [place], [place] [address]  Comprehensive metabolic panel     Status: None   Collection Time: 07/09/20  2:48 PM  Result Value Ref Range   Sodium 142 135 - 145 mmol/L   Potassium 4.0 3.5 - 5.1 mmol/L   Chloride 105 98 - 111 mmol/L   CO2 27 22 - 32 mmol/L   Glucose, Bld 95 70 - 99 mg/dL    Comment: Glucose reference range applies only to samples taken after fasting for at least 8 hours.   BUN 18 8 - 23 mg/dL   Creatinine, Ser 0.83 0.61 - 1.24 mg/dL   Calcium 9.5 8.9 - 10.3 mg/dL    Total Protein 7.3 6.5 - 8.1 g/dL   Albumin 4.3 3.5 - 5.0 g/dL   AST 15 15 - 41 U/L   ALT <5 0 - 44 U/L   Alkaline Phosphatase 63 38 - 126 U/L   Total Bilirubin 0.6 0.3 - 1.2 mg/dL   GFR, Estimated >60 >60 mL/min    Comment: (NOTE) Calculated using the CKD-EPI Creatinine Equation (2021)    Anion gap 10 5 - 15    Comment: Performed at [facility], [place] [address], [place], [place] [address]  Magnesium     Status: None   Collection Time: 07/09/20  2:48 PM  Result Value Ref Range   Magnesium 2.2 1.7 - 2.4 mg/dL    Comment: Performed at [facility], [place] [address], [place],  [address]  CBG monitoring, ED     Status: None   Collection Time: 07/09/20  2:49 PM  Result Value Ref Range   Glucose-Capillary 95 70 - 99 mg/dL    Comment: Glucose reference range applies only to samples taken after fasting for at least 8 hours.  I-stat chem 8, ED     Status: Abnormal  Collection Time: 07/09/20  2:54 PM  Result Value Ref Range   Sodium 142 135 - 145 mmol/L   Potassium 4.2 3.5 - 5.1 mmol/L   Chloride 103 98 - 111 mmol/L   BUN 19 8 - 23 mg/dL   Creatinine, Ser 0.70 0.61 - 1.24 mg/dL   Glucose, Bld 90 70 - 99 mg/dL    Comment: Glucose reference range applies only to samples taken after fasting for at least 8 hours.   Calcium, Ion 1.28 1.15 - 1.40 mmol/L   TCO2 28 22 - 32 mmol/L   Hemoglobin 12.6 (L) 13.0 - 17.0 g/dL   HCT 37.0 (L) 39.0 - 52.0 %  Urine rapid drug screen (hosp performed)     Status: None   Collection Time: 07/09/20  3:58 PM  Result Value Ref Range   Opiates NONE DETECTED NONE DETECTED   Cocaine NONE DETECTED NONE DETECTED   Benzodiazepines NONE DETECTED NONE DETECTED   Amphetamines NONE DETECTED NONE DETECTED   Tetrahydrocannabinol NONE DETECTED NONE DETECTED   Barbiturates NONE DETECTED NONE DETECTED    Comment: (NOTE) DRUG SCREEN FOR MEDICAL PURPOSES ONLY.  IF CONFIRMATION IS NEEDED FOR ANY PURPOSE, NOTIFY LAB WITHIN 5  DAYS.  LOWEST DETECTABLE LIMITS FOR URINE DRUG SCREEN Drug Class                     Cutoff (ng/mL) Amphetamine and metabolites    1000 Barbiturate and metabolites    200 Benzodiazepine                 [id number] Tricyclics and metabolites     300 Opiates and metabolites        300 Cocaine and metabolites        300 THC                            50 Performed at [facility], [place] [address], [place], [place] [address]   Urinalysis, Routine w reflex microscopic Urine, Clean Catch     Status: Abnormal   Collection Time: 07/09/20  3:58 PM  Result Value Ref Range   Color, Urine YELLOW YELLOW   APPearance CLEAR CLEAR   Specific Gravity, Urine 1.015 1.005 - 1.030   pH 5.0 5.0 - 8.0   Glucose, UA NEGATIVE NEGATIVE mg/dL   Hgb urine dipstick NEGATIVE NEGATIVE   Bilirubin Urine NEGATIVE NEGATIVE   Ketones, ur 5 (A) NEGATIVE mg/dL   Protein, ur NEGATIVE NEGATIVE mg/dL   Nitrite NEGATIVE NEGATIVE   Leukocytes,Ua NEGATIVE NEGATIVE    Comment: Performed at [place] [address] [place], [place] [address]    Chemistries  Recent Labs  Lab 07/09/20 1448 07/09/20 1454  NA 142 142  K 4.0 4.2  CL 105 103  CO2 27  --   GLUCOSE 95 90  BUN 18 19  CREATININE 0.83 0.70  CALCIUM 9.5  --   MG 2.2  --   AST 15  --   ALT <5  --   ALKPHOS 63  --   BILITOT 0.6  --    ------------------------------------------------------------------------------------------------------------------  ------------------------------------------------------------------------------------------------------------------ GFR: CrCl cannot be calculated (Unknown ideal weight.). Liver Function Tests: Recent Labs  Lab 07/09/20 1448  AST 15  ALT <5  ALKPHOS 63  BILITOT 0.6  PROT 7.3  ALBUMIN 4.3   No results for input(s): LIPASE, AMYLASE in the last 168 hours. No results for input(s): AMMONIA in  the last 168 hours. Coagulation Profile: Recent Labs  Lab 07/09/20 1448   INR 1.0   Cardiac Enzymes: No results for input(s): CKTOTAL, CKMB, CKMBINDEX, TROPONINI in the last 168 hours. BNP (last 3 results) No results for input(s): PROBNP in the last 8760 hours. HbA1C: No results for input(s): HGBA1C in the last 72 hours. CBG: Recent Labs  Lab 07/09/20 1449  GLUCAP 95   Lipid Profile: No results for input(s): CHOL, HDL, LDLCALC, TRIG, CHOLHDL, LDLDIRECT in the last 72 hours. Thyroid Function Tests: No results for input(s): TSH, T4TOTAL, FREET4, T3FREE, THYROIDAB in the last 72 hours. Anemia Panel: No results for input(s): VITAMINB12, FOLATE, FERRITIN, TIBC, IRON, RETICCTPCT in the last 72 hours.  --------------------------------------------------------------------------------------------------------------- Urine analysis:    Component Value Date/Time   COLORURINE YELLOW 07/09/2020 [place] 07/09/2020 1558   LABSPEC 1.015 07/09/2020 1558   PHURINE 5.0 07/09/2020 1558   GLUCOSEU NEGATIVE 07/09/2020 1558   HGBUR NEGATIVE 07/09/2020 1558   BILIRUBINUR NEGATIVE 07/09/2020 1558   KETONESUR 5 (A) 07/09/2020 1558   PROTEINUR NEGATIVE 07/09/2020 1558   NITRITE NEGATIVE 07/09/2020 1558   LEUKOCYTESUR NEGATIVE 07/09/2020 1558      Imaging Results:    CT HEAD WO CONTRAST  Result Date: 07/09/2020 CLINICAL DATA:  Slurred speech EXAM: CT HEAD WITHOUT CONTRAST CT CERVICAL SPINE WITHOUT CONTRAST TECHNIQUE: Multidetector CT imaging of the head and cervical spine was performed following the standard protocol without intravenous contrast. Multiplanar CT image reconstructions of the cervical spine were also generated. COMPARISON:  CT brain and cervical spine 07/14/2017, MRI 07/15/2017 FINDINGS: CT HEAD FINDINGS Brain: No acute territorial infarction or intracranial mass is visualized. Acute left convexity subdural hematoma measuring up to 10 mm maximum thickness. No significant midline shift. Mild atrophy. Stable ventricle size. Vascular: No  hyperdense vessels. Vertebral and carotid vascular calcification Skull: Normal. Negative for fracture or focal lesion. Sinuses/Orbits: No acute finding. Other: None CT CERVICAL SPINE FINDINGS Alignment: Straightening of the cervical spine.  No subluxation. Skull base and vertebrae: No acute fracture. No primary bone lesion or focal pathologic process. Soft tissues and spinal canal: No prevertebral fluid or swelling. No visible canal hematoma. Disc levels: Bulky flowing anterior osteophytes C3 through T1 with ankylosis, consistent with dish. Mild disc space narrowing at C2-C3, C4-C5, C5-C6 and C6-C7. Facet degenerative changes at multiple levels. Upper chest: Minimal apical scarring. Other: None IMPRESSION: 1. Acute left convexity subdural hematoma measuring up to 10 mm maximum thickness. No significant midline shift. 2. 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 No acute osseous abnormality. Critical Value/emergent results were called by telephone at the time of interpretation on 07/09/2020 at 5:05 pm to provider DAVID YAO , who verbally acknowledged these results. Electronically Signed   By: Donavan Foil M.D.   On: 07/09/2020 17:06   CT Cervical Spine Wo Contrast  Result Date: 07/09/2020 CLINICAL DATA:  Slurred speech EXAM: CT HEAD WITHOUT CONTRAST CT CERVICAL SPINE WITHOUT CONTRAST TECHNIQUE: Multidetector CT imaging of the head and cervical spine was performed following the standard protocol without intravenous contrast. Multiplanar CT image reconstructions of the cervical spine were also generated. COMPARISON:  CT brain and cervical spine 07/14/2017, MRI 07/15/2017 FINDINGS: CT HEAD FINDINGS Brain: No acute territorial infarction or intracranial mass is visualized. Acute left convexity subdural hematoma measuring up to 10 mm maximum thickness. No significant midline shift. Mild atrophy. Stable ventricle size. Vascular: No hyperdense vessels.  Vertebral and carotid vascular calcification Skull:  Normal. Negative for fracture or focal lesion. Sinuses/Orbits: No acute finding. Other: None CT CERVICAL SPINE FINDINGS Alignment: Straightening of the cervical spine.  No subluxation. Skull base and vertebrae: No acute fracture. No primary bone lesion or focal pathologic process. Soft tissues and spinal canal: No prevertebral fluid or swelling. No visible canal hematoma. Disc levels: Bulky flowing anterior osteophytes C3 through T1 with ankylosis, consistent with dish. Mild disc space narrowing at C2-C3, C4-C5, C5-C6 and C6-C7. Facet degenerative changes at multiple levels. Upper chest: Minimal apical scarring. Other: None IMPRESSION: 1. Acute left convexity subdural hematoma measuring up to 10 mm maximum thickness. No significant midline shift. 2. 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 No acute osseous abnormality. Critical Value/emergent results were called by telephone at the time of interpretation on 07/09/2020 at 5:05 pm to provider DAVID YAO , who verbally acknowledged these results. Electronically Signed   By: Donavan Foil M.D.   On: 07/09/2020 17:06   DG Chest Port 1 View  Result Date: 07/09/2020 CLINICAL DATA:  Slurred speech EXAM: PORTABLE CHEST 1 VIEW COMPARISON:  10/17/2009 FINDINGS: The heart size and mediastinal contours are within normal limits. Both lungs are clear. The visualized skeletal structures are unremarkable. IMPRESSION: No active disease. Electronically Signed   By: Kathreen Devoid   On: 07/09/2020 15:07    My personal review of EKG: Rhythm NSR, no ST-T changes   Assessment & Plan:    Active Problems:   SDH (subdural hematoma) (HCC)   1. Subdural hematoma-secondary to fall 4 days ago.  CT head shows 10 mm subdural hematoma on the left.  No significant midline shift.  Neurosurgery has been consulted, neurosurgery PA evaluating patient in the ED.  Plan to  admit patient to [facility] and repeat head CT in a.m.  Neurochecks every 2 hours. 2. Slurred speech-concern for stroke, CT head was negative for stroke.  MRI brain has been ordered.  We'll follow the results. 3. Seizure-patient had one episode of seizure in the ED which lasted for about 15 seconds, 1 g of Keppra has been ordered.  Continue Keppra 500 mg p.o. twice daily.  Seizure precautions. 4. Parkinson disease-continue Sinemet 5. Diabetes mellitus type 2-hold metformin, Glipizide.  Start sliding scale insulin with NovoLog.  Check CBG before every meal and at bedtime.  Carb modified diet. 6. Hypothyroidism-continue Synthroid, check TSH 7. Hypertension-continue losartan.  Start hydralazine 25 mg p.o. every 6 hours as needed for BP more than 160/100. 8. Hyperlipidemia-continue Lipitor   DVT Prophylaxis-    SCDs   AM Labs Ordered, also please review Full Orders  Family Communication: Admission, patients condition and plan of care including tests being ordered have been discussed with the patient and his wife at bedside who indicate understanding and agree with the plan and Code Status.  Code Status: Full code  Admission status: Inpatient :The appropriate admission status for this patient is INPATIENT. Inpatient status is judged to be reasonable and necessary in order to provide the required intensity of service to ensure the patient's safety. The patient's presenting symptoms, physical exam findings, and initial radiographic and laboratory data in the context of their chronic comorbidities is felt to place them at high risk for further clinical deterioration. Furthermore, it is not anticipated that the patient will be medically stable for discharge from the hospital within 2 midnights of admission. The following factors support the admission status of inpatient.     The patient's presenting symptoms  include slurred speech The worrisome physical exam findings include none. The initial  radiographic and laboratory data are worrisome because of subdural hematoma. The chronic co-morbidities include Parkinson's disease.       * I certify that at the point of admission it is my clinical judgment that the patient will require inpatient hospital care spanning beyond 2 midnights from the point of admission due to high intensity of service, high risk for further deterioration and high frequency of surveillance required.*  Time spent in minutes : 60 minutes   Ruchama Kubicek S Aaira Oestreicher M.D

## 2020-07-09 NOTE — ED Notes (Signed)
Called MRI to check and see when patient will receive scan with no answer.

## 2020-07-09 NOTE — ED Triage Notes (Signed)
Pts wife noticed slurred speech around 930-10 am this morning. Last seen normal was last night. No facial droop present. Bilateral grips, strength, and sensation equal. Pt denies weakness. A&O x4. Pts wife reports pt had a fall on Monday and hit his head. EMS came to the house and cleared pt at that time. Pt has hx of HTN and diabetes.

## 2020-07-09 NOTE — ED Notes (Signed)
Called the floor to give report to [employer], unable to take report at this time and secretary given number to call me back for report in 10 minutes.

## 2020-07-09 NOTE — Telephone Encounter (Signed)
Advised pt wife to take the pt to the ED for evaluation.

## 2020-07-09 NOTE — ED Provider Notes (Signed)
  Physical Exam  BP (!) 178/87   Pulse 83   Temp (!) 97.5 F (36.4 C) (Oral)   Resp 20   SpO2 97%   Physical Exam  ED Course/Procedures     Procedures  MDM  Care assumed at signout.  Patient had a fall 4 days ago and did hit his head.  He had slurred speech starting today.  He has baseline weakness from Parkinson's.  Signout pending CT head and possible MRI brain.  5:36 PM CT showed left subdural hemorrhage with no midline shift.  I discussed case with Vinne from neurosurgery.  He recommend repeat CT in 6 hours as well as EEG and possible MRI to rule out ischemic stroke.  He felt that patient's subdural hemorrhage is so small that it would unlikely cause of symptoms and wants to look at ischemic stroke and seizure as potential etiologies.  He is coming in to see the patient and recommend hospitalist admission.  CRITICAL CARE Performed by: Wandra Arthurs   Total critical care time: 30 min   Critical care time was exclusive of separately billable procedures and treating other patients.  Critical care was necessary to treat or prevent imminent or life-threatening deterioration.  Critical care was time spent personally by me on the following activities: development of treatment plan with patient and/or surrogate as well as nursing, discussions with consultants, evaluation of patient's response to treatment, examination of patient, obtaining history from patient or surrogate, ordering and performing treatments and interventions, ordering and review of laboratory studies, ordering and review of radiographic studies, pulse oximetry and re-evaluation of patient's condition.     Drenda Freeze, MD 07/09/20 [phone]

## 2020-07-09 NOTE — Telephone Encounter (Signed)
Telephone call from pt wife,  EMS was called out last week to the house, Pt had fallen in the house and he was unable to get up. EMS check the pt out, His Sugar levels where a little high but nothing concerning to go the ED. Per Wife this morning pt was unable to to form words or sentences.    Spoke to pt myself he was able to say it is okay, but his speech was a little slurred. But he was able to let me know he woke up at 5 am this morning and he wasn't felt wired. Pt reports no pain, swelling, or dizziness or headache that he has noticed today.    Advised pt and wife DR.Tat not in th office but I will  Inform  her on how the pt is feeling call them back.

## 2020-07-10 ENCOUNTER — Inpatient Hospital Stay (HOSPITAL_COMMUNITY): Payer: HMO

## 2020-07-10 DIAGNOSIS — R220 Localized swelling, mass and lump, head: Secondary | ICD-10-CM | POA: Diagnosis not present

## 2020-07-10 DIAGNOSIS — R569 Unspecified convulsions: Secondary | ICD-10-CM

## 2020-07-10 DIAGNOSIS — R4781 Slurred speech: Secondary | ICD-10-CM | POA: Diagnosis not present

## 2020-07-10 DIAGNOSIS — S065X0A Traumatic subdural hemorrhage without loss of consciousness, initial encounter: Secondary | ICD-10-CM | POA: Diagnosis not present

## 2020-07-10 DIAGNOSIS — S065X9A Traumatic subdural hemorrhage with loss of consciousness of unspecified duration, initial encounter: Secondary | ICD-10-CM | POA: Diagnosis not present

## 2020-07-10 DIAGNOSIS — I6200 Nontraumatic subdural hemorrhage, unspecified: Secondary | ICD-10-CM | POA: Diagnosis not present

## 2020-07-10 LAB — COMPREHENSIVE METABOLIC PANEL
ALT: <5 U/L (ref 0–44)
AST: 13 U/L — ABNORMAL LOW (ref 15–41)
Albumin: 3.5 g/dL (ref 3.5–5.0)
Alkaline Phosphatase: 57 U/L (ref 38–126)
Anion gap: 10 (ref 5–15)
BUN: 17 mg/dL (ref 8–23)
CO2: 25 mmol/L (ref 22–32)
Calcium: 8.7 mg/dL — ABNORMAL LOW (ref 8.9–10.3)
Chloride: 106 mmol/L (ref 98–111)
Creatinine, Ser: 0.68 mg/dL (ref 0.61–1.24)
GFR, Estimated: >60 mL/min (ref >60)
Glucose, Bld: 97 mg/dL (ref 70–99)
Potassium: 3.9 mmol/L (ref 3.5–5.1)
Sodium: 141 mmol/L (ref 135–145)
Total Bilirubin: 0.4 mg/dL (ref 0.3–1.2)
Total Protein: 5.9 g/dL — ABNORMAL LOW (ref 6.5–8.1)

## 2020-07-10 LAB — GLUCOSE, CAPILLARY
Glucose-Capillary: 105 mg/dL — ABNORMAL HIGH (ref 70–99)
Glucose-Capillary: 155 mg/dL — ABNORMAL HIGH (ref 70–99)
Glucose-Capillary: 85 mg/dL (ref 70–99)

## 2020-07-10 LAB — CBC
HCT: 33.4 % — ABNORMAL LOW (ref 39.0–52.0)
Hemoglobin: 11.1 g/dL — ABNORMAL LOW (ref 13.0–17.0)
MCH: 31.1 pg (ref 26.0–34.0)
MCHC: 33.2 g/dL (ref 30.0–36.0)
MCV: 93.6 fL (ref 80.0–100.0)
Platelets: 170 10*3/uL (ref 150–400)
RBC: 3.57 MIL/uL — ABNORMAL LOW (ref 4.22–5.81)
RDW: 13.8 % (ref 11.5–15.5)
WBC: 6.1 10*3/uL (ref 4.0–10.5)
nRBC: 0 % (ref 0.0–0.2)

## 2020-07-10 LAB — HEMOGLOBIN A1C
Hgb A1c MFr Bld: 5 % (ref 4.8–5.6)
Mean Plasma Glucose: 96.8 mg/dL

## 2020-07-10 MED ORDER — LEVETIRACETAM 500 MG PO TABS: 500.0000 mg | ORAL_TABLET | Freq: Two times a day (BID) | ORAL | 3 refills | Status: DC

## 2020-07-10 MED ORDER — MIRABEGRON ER 25 MG PO TB24
50.0000 mg | ORAL_TABLET | Freq: Every day | ORAL | Status: DC
  Administered 2020-07-10: 50 mg via ORAL
  Filled 2020-07-10: qty 2

## 2020-07-10 NOTE — Progress Notes (Signed)
Received pt on a stretcher accompanied by ER NT, pt alert, have slurred speech upon arrival, able to transferred himself from stretcher to bed with moderate assist, no complains of pain and will continue plan of care.

## 2020-07-10 NOTE — Care Management Obs Status (Signed)
[facility] NOTIFICATION   Patient Details  Name: Justin Gomez MRN: [id number] Date of Birth: 08-Mar-1941   Medicare Observation Status Notification Given:  Yes    Lia Hopping, [place] 07/10/2020, 3:08 PM

## 2020-07-10 NOTE — Evaluation (Signed)
Physical Therapy Evaluation Patient Details Name: Justin Gomez MRN: [id number] DOB: [birth year] Today's Date: 07/10/2020   History of Present Illness  80 y.o. male, he has history of hypertension, diabetes mellitus type 2, Parkinson disease,dementia,  hypothyroidism, hyperlipidemia, GERD, BPH, R TKA, CVA/SDH. pt was brought to hospital after patient developed slurred speech around 10 AM this morning.  He was having trouble forming words.  Patient had recent head injury on Monday when he fell and hit his head on the kitchen cabinet.  There is no one-sided weakness or numbness on admission. Neurosurgery was consulted, and they recommend patient to be admitted at [facility] and repeat head CT. while neurosurgery PA in room pt developed Sz like activity  Clinical Impression  Patient evaluated by Physical Therapy with no further acute PT needs identified. All education has been completed and the patient has no further questions.  Reviewed use of gait belt, safety/stair safety with pt and wife. Pt seems to be at or near his baseline.  Recommend pt keep his OPPT appointment and possibly will also need evaluation by SLP at OP neuro. See below for any follow-up Physical Therapy or equipment needs. PT is signing off. Thank you for this referral.     Follow Up Recommendations Outpatient PT    Equipment Recommendations  None recommended by PT    Recommendations for Other Services       Precautions / Restrictions Precautions Precautions: Fall Restrictions Weight Bearing Restrictions: No      Mobility  Bed Mobility               General bed mobility comments: up in chair    Transfers Overall transfer level: Needs assistance Equipment used: None Transfers: Sit to/from Stand Sit to Stand: Min guard;Supervision         General transfer comment: for safety  Ambulation/Gait Ambulation/Gait assistance: Min guard;Supervision Gait Distance (Feet): 380 Feet Assistive  device: None Gait Pattern/deviations: Step-through pattern;Decreased stride length;Trunk flexed     General Gait Details: supervision to min/guard for safety. unsteady initially however no overt LOB  Stairs Stairs: Yes Stairs assistance: Min guard Stair Management: Alternating pattern;One rail Left;One rail Right Number of Stairs: 3 General stair comments: cues for sequence and safety, min/guard for safety and balance  Wheelchair Mobility    Modified Rankin (Stroke Patients Only)       Balance Overall balance assessment: Needs assistance Sitting-balance support: Feet supported;No upper extremity supported Sitting balance-Leahy Scale: Good       Standing balance-Leahy Scale: Fair Standing balance comment: not tested to mod challenges, see below for higher level balance activities             High level balance activites: Backward walking;Direction changes;Turns;Sudden stops;Head turns High Level Balance Comments: min/guard for safety, no overt LOB             Pertinent Vitals/Pain Pain Assessment: No/denies pain    Home Living Family/patient expects to be discharged to:: Private residence Living Arrangements: Spouse/significant other Available Help at Discharge: Family;Available 24 hours/day Type of Home: Apartment Home Access: Stairs to enter Entrance Stairs-Rails: Left Entrance Stairs-Number of Steps: 1 flight/10 steps Home Layout: One level Home Equipment: Walker - 2 wheels;Grab bars - toilet;Shower seat Additional Comments: discussed shower bench for incr safety    Prior Function Level of Independence: Independent               Hand Dominance        Extremity/Trunk Assessment   Upper  Extremity Assessment Upper Extremity Assessment: Overall WFL for tasks assessed    Lower Extremity Assessment Lower Extremity Assessment: Overall WFL for tasks assessed (denies N/T, any new deficits or changes)       Communication   Communication: No  difficulties  Cognition Arousal/Alertness: Awake/alert Behavior During Therapy: WFL for tasks assessed/performed Overall Cognitive Status: Within Functional Limits for tasks assessed                                        General Comments General comments (skin integrity, edema, etc.): discussed pt slowing pace on stairs, wife reports he is moving much more quickly here than he does at home    Exercises     Assessment/Plan    PT Assessment All further PT needs can be met in the next venue of care (pt being d/c'd today)  PT Problem List         PT Treatment Interventions      PT Goals (Current goals can be found in the Care Plan section)  Acute Rehab PT Goals Patient Stated Goal: home today PT Goal Formulation: All assessment and education complete, DC therapy    Frequency     Barriers to discharge        Co-evaluation               AM-PAC PT "6 Clicks" Mobility  Outcome Measure Help needed turning from your back to your side while in a flat bed without using bedrails?: None Help needed moving from lying on your back to sitting on the side of a flat bed without using bedrails?: None Help needed moving to and from a bed to a chair (including a wheelchair)?: None Help needed standing up from a chair using your arms (e.g., wheelchair or bedside chair)?: A Little Help needed to walk in hospital room?: A Little Help needed climbing 3-5 steps with a railing? : A Little 6 Click Score: 21    End of Session Equipment Utilized During Treatment: Gait belt Activity Tolerance: Patient tolerated treatment well Patient left: with call bell/phone within reach;in chair;with chair alarm set Nurse Communication: Mobility status      Time: 8502-7741 PT Time Calculation (min) (ACUTE ONLY): 23 min   Charges:   PT Evaluation $PT Eval Low Complexity: 1 Low PT Treatments $Gait Training: 8-22 mins        Baxter Flattery, PT  Acute Rehab Dept ([place])  [phone] Pager [phone]  07/10/2020   [facility] 07/10/2020, 1:28 PM

## 2020-07-10 NOTE — Progress Notes (Signed)
  NEUROSURGERY PROGRESS NOTE   Repeat head CT reviewed and compared to prior. The small, left convexity SDH is unchanged. No new ICH. Remains no role for NS intervention. No need for repeat imaging unless exam changes.   MRI without evidence of CVA. EEG still pending. Could consider consulting neurology.    Please call for any concerns.  Ferne Reus, PA-C [facility] [employer]

## 2020-07-10 NOTE — Discharge Summary (Signed)
Physician Discharge Summary  Justin Gomez OZD:[id number] DOB: 22-Apr-1941 DOA: 07/09/2020  PCP: Jani Gravel, MD  Admit date: 07/09/2020 Discharge date: 07/10/2020  Time spent: 50 minutes  Recommendations for Outpatient Follow-up:  1. Follow-up with neurosurgery in 2 weeks 2. Follow-up neurology in 3 weeks  Discharge Diagnoses:  Active Problems:   SDH (subdural hematoma) (HCC)   Discharge Condition: Stable  Diet recommendation: Carb modified diet  Filed Weights   07/10/20 0144  Weight: 72.3 kg    History of present illness:  80 y.o. male, he has history of hypertension, diabetes mellitus type 2, Parkinson disease, hypothyroidism, hyperlipidemia, GERD, BPH was brought to hospital after patient developed slurred speech around 10 AM this morning.  He was having trouble forming words.  Patient had recent head injury on Monday when he fell and hit his head on the kitchen cabinet.  There is no one-sided weakness or numbness.  Denies chest pain or shortness of breath.  Denies blurred vision.  Denies nausea vomiting or diarrhea.  Denies abdominal pain or dysuria.  In the ED CT head was obtained which showed acute left convexity subdural hematoma measuring up to 10 mm thickness.  No significant midline shift.  Neurosurgery was consulted, and they recommend patient to be admitted at [facility] and repeat head CT in a.m. While in the room,  neurosurgery PA arrived and patient started having seizure like activity.  It lasted for about 15 seconds.  1 g Keppra has been ordered as per neurosurgery.   Hospital Course:   Subdural hematoma-patient presented with slurred speech, CT scan showed 10 mm subdural hematoma on left.  No significant midline shift.  Neurosurgery was consulted, repeat CT head this morning did not show significant change so no further intervention recommended.  Patient will follow-up with neurosurgery in 2 weeks.  Till that time he will hold aspirin.  Seizure-patient  had 1 episode of seizure in the ED which lasted for 15 seconds.  He was started on Keppra 5 mg p.o. twice daily after he received 1 g load of Keppra in the ED.  At this time EEG has not been done, he can follow-up with neurology as outpatient to get the EEG.  In the meantime patient will be discharged on Keppra 500 mg p.o. twice daily.  Slurred speech-there was concern for stroke, CT head was negative for stroke MRI brain also ordered which did not show new stroke.  Showed tiny remote lacunar infarct.  At this time aspirin is currently on hold due to the subdural hematoma.  Patient will be discharged home, he has no focal neurological deficit except slurred speech which has improved.  He will get outpatient speech therapy and outpatient physical therapy referral.  Hypertension-continue losartan.  Hyperlipidemia-continue Lipitor.  Diabetes mellitus type 2-continue metformin, glipizide.  Parkinson disease-continue Sinemet  Procedures:    Consultations:    Discharge Exam: Vitals:   07/10/20 0200 07/10/20 0400  BP: (!) 144/74 (!) 145/69  Pulse: 73 69  Resp: 18 14  Temp:    SpO2: 99% 98%    General: Appears in no acute distress Cardiovascular: S1-S2, regular Respiratory: Clear to auscultation bilaterally  Discharge Instructions   Discharge Instructions    Ambulatory referral to Neurology   Complete by: As directed    An appointment is requested in approximately:  3 weeks   Ambulatory referral to Speech Therapy   Complete by: As directed    Diet - low sodium heart healthy   Complete by: As  directed    Discharge instructions   Complete by: As directed    Do not take aspirin till you see neurosurgeon in office in 2 weeks   Increase activity slowly   Complete by: As directed      Allergies as of 07/10/2020      Reactions   Benzonatate Anaphylaxis   Closed throat; difficulty swallowing      Medication List    STOP taking these medications   aspirin 81 MG tablet      TAKE these medications   atorvastatin 20 MG tablet Commonly known as: LIPITOR Take 1 tablet (20 mg total) by mouth daily at 6 PM.   carbidopa-levodopa 25-100 MG tablet Commonly known as: SINEMET IR Take 2 tablets by mouth 3 (three) times daily.   finasteride 5 MG tablet Commonly known as: PROSCAR Take 5 mg by mouth daily.   glipiZIDE 5 MG tablet Commonly known as: GLUCOTROL Take 5 mg by mouth 2 (two) times daily.   levETIRAcetam 500 MG tablet Commonly known as: KEPPRA Take 1 tablet (500 mg total) by mouth 2 (two) times daily.   levothyroxine 75 MCG tablet Commonly known as: SYNTHROID Take 75 mcg by mouth daily.   losartan 25 MG tablet Commonly known as: COZAAR Take 25 mg by mouth daily.   metFORMIN 750 MG 24 hr tablet Commonly known as: GLUCOPHAGE-XR Take 750 mg by mouth 2 (two) times daily.   mirabegron ER 50 MG Tb24 tablet Commonly known as: MYRBETRIQ Take 50 mg by mouth daily.      Allergies  Allergen Reactions  . Benzonatate Anaphylaxis    Closed throat; difficulty swallowing    Follow-up Information    Consuella Lose, MD. Schedule an appointment as soon as possible for a visit in 2 week(s).   Specialty: Neurosurgery Contact information: [address] [place] [address] [phone]                The results of significant diagnostics from this hospitalization (including imaging, microbiology, ancillary and laboratory) are listed below for reference.    Significant Diagnostic Studies: CT HEAD WO CONTRAST  Result Date: 07/10/2020 CLINICAL DATA:  Follow-up subdural hemorrhage EXAM: CT HEAD WITHOUT CONTRAST TECHNIQUE: Contiguous axial images were obtained from the base of the skull through the vertex without intravenous contrast. COMPARISON:  Yesterday FINDINGS: Brain: High-density subdural hematoma along the left cerebral convexity measuring up to 8 mm in thickness. Mild cortical mass effect. No interval growth. No new  site of hemorrhage. No visible infarct, hydrocephalus, or mass. Vascular: No hyperdense vessel or unexpected calcification. Skull: Normal. Negative for fracture or focal lesion. Sinuses/Orbits: No acute finding. IMPRESSION: No progression of the left-sided subdural hematoma. Mass effect remains mild. Electronically Signed   By: Monte Fantasia M.D.   On: 07/10/2020 06:39   CT HEAD WO CONTRAST  Result Date: 07/09/2020 CLINICAL DATA:  Slurred speech EXAM: CT HEAD WITHOUT CONTRAST CT CERVICAL SPINE WITHOUT CONTRAST TECHNIQUE: Multidetector CT imaging of the head and cervical spine was performed following the standard protocol without intravenous contrast. Multiplanar CT image reconstructions of the cervical spine were also generated. COMPARISON:  CT brain and cervical spine 07/14/2017, MRI 07/15/2017 FINDINGS: CT HEAD FINDINGS Brain: No acute territorial infarction or intracranial mass is visualized. Acute left convexity subdural hematoma measuring up to 10 mm maximum thickness. No significant midline shift. Mild atrophy. Stable ventricle size. Vascular: No hyperdense vessels. Vertebral and carotid vascular calcification Skull: Normal. Negative for fracture or focal lesion.  Sinuses/Orbits: No acute finding. Other: None CT CERVICAL SPINE FINDINGS Alignment: Straightening of the cervical spine.  No subluxation. Skull base and vertebrae: No acute fracture. No primary bone lesion or focal pathologic process. Soft tissues and spinal canal: No prevertebral fluid or swelling. No visible canal hematoma. Disc levels: Bulky flowing anterior osteophytes C3 through T1 with ankylosis, consistent with dish. Mild disc space narrowing at C2-C3, C4-C5, C5-C6 and C6-C7. Facet degenerative changes at multiple levels. Upper chest: Minimal apical scarring. Other: None IMPRESSION: 1. Acute left convexity subdural hematoma measuring up to 10 mm maximum thickness. No significant midline shift. 2. 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 No acute osseous abnormality. Critical Value/emergent results were called by telephone at the time of interpretation on 07/09/2020 at 5:05 pm to provider DAVID YAO , who verbally acknowledged these results. Electronically Signed   By: Donavan Foil M.D.   On: 07/09/2020 17:06   CT Cervical Spine Wo Contrast  Result Date: 07/09/2020 CLINICAL DATA:  Slurred speech EXAM: CT HEAD WITHOUT CONTRAST CT CERVICAL SPINE WITHOUT CONTRAST TECHNIQUE: Multidetector CT imaging of the head and cervical spine was performed following the standard protocol without intravenous contrast. Multiplanar CT image reconstructions of the cervical spine were also generated. COMPARISON:  CT brain and cervical spine 07/14/2017, MRI 07/15/2017 FINDINGS: CT HEAD FINDINGS Brain: No acute territorial infarction or intracranial mass is visualized. Acute left convexity subdural hematoma measuring up to 10 mm maximum thickness. No significant midline shift. Mild atrophy. Stable ventricle size. Vascular: No hyperdense vessels. Vertebral and carotid vascular calcification Skull: Normal. Negative for fracture or focal lesion. Sinuses/Orbits: No acute finding. Other: None CT CERVICAL SPINE FINDINGS Alignment: Straightening of the cervical spine.  No subluxation. Skull base and vertebrae: No acute fracture. No primary bone lesion or focal pathologic process. Soft tissues and spinal canal: No prevertebral fluid or swelling. No visible canal hematoma. Disc levels: Bulky flowing anterior osteophytes C3 through T1 with ankylosis, consistent with dish. Mild disc space narrowing at C2-C3, C4-C5, C5-C6 and C6-C7. Facet degenerative changes at multiple levels. Upper chest: Minimal apical scarring. Other: None IMPRESSION: 1. Acute left convexity subdural hematoma measuring up to 10 mm maximum thickness. No significant midline shift. 2. Straightening of the cervical spine with diffuse idiopathic  skeletal hyperostosis type changes C3 through T1. No acute osseous abnormality. Critical Value/emergent results were called by telephone at the time of interpretation on 07/09/2020 at 5:05 pm to provider DAVID YAO , who verbally acknowledged these results. Electronically Signed   By: Donavan Foil M.D.   On: 07/09/2020 17:06   MR BRAIN WO CONTRAST  Result Date: 07/09/2020 CLINICAL DATA:  Slurred speech, prior fall. EXAM: MRI HEAD WITHOUT CONTRAST TECHNIQUE: Multiplanar, multiecho pulse sequences of the brain and surrounding structures were obtained without intravenous contrast. COMPARISON:  07/09/2020 and prior. FINDINGS: Brain: No acute infarct. Acute left cerebral convexity subdural hematoma measuring up to 1.0 cm is unchanged. No significant mass effect or parenchymal edema. No midline shift or ventriculomegaly. Mild cerebral atrophy with ex vacuo dilatation. Tiny remote right cerebellar lacunar insult. Vascular: Normal flow voids. Skull and upper cervical spine: Normal marrow signal. Sinuses/Orbits: No acute orbital finding. Pneumatized paranasal sinuses. Trace left mastoid free fluid. Other: None. IMPRESSION: Unchanged acute left cerebral convexity subdural hematoma measuring 1.0 cm. Mild cerebral atrophy.  Tiny remote right cerebellar lacunar insult. Electronically Signed   By: Primitivo Gauze M.D.   On: 07/09/2020 20:11   DG Chest  Port 1 View  Result Date: 07/09/2020 CLINICAL DATA:  Slurred speech EXAM: PORTABLE CHEST 1 VIEW COMPARISON:  10/17/2009 FINDINGS: The heart size and mediastinal contours are within normal limits. Both lungs are clear. The visualized skeletal structures are unremarkable. IMPRESSION: No active disease. Electronically Signed   By: Kathreen Devoid   On: 07/09/2020 15:07    Microbiology: Recent Results (from the past 240 hour(s))  SARS Coronavirus 2 by RT PCR (hospital order, performed in [facility] hospital lab) Nasopharyngeal Nasopharyngeal Swab     Status: None    Collection Time: 07/09/20  5:09 PM   Specimen: Nasopharyngeal Swab  Result Value Ref Range Status   SARS Coronavirus 2 NEGATIVE NEGATIVE Final    Comment: (NOTE) SARS-CoV-2 target nucleic acids are NOT DETECTED.  The SARS-CoV-2 RNA is generally detectable in upper and lower respiratory specimens during the acute phase of infection. The lowest concentration of SARS-CoV-2 viral copies this assay can detect is 250 copies / mL. A negative result does not preclude SARS-CoV-2 infection and should not be used as the sole basis for treatment or other patient management decisions.  A negative result may occur with improper specimen collection / handling, submission of specimen other than nasopharyngeal swab, presence of viral mutation(s) within the areas targeted by this assay, and inadequate number of viral copies (<250 copies / mL). A negative result must be combined with clinical observations, patient history, and epidemiological information.  Fact Sheet for Patients:   StrictlyIdeas.no  Fact Sheet for Healthcare Providers: BankingDealers.co.za  This test is not yet approved or  cleared by the [place] FDA and has been authorized for detection and/or diagnosis of SARS-CoV-2 by FDA under an Emergency Use Authorization (EUA).  This EUA will remain in effect (meaning this test can be used) for the duration of the COVID-19 declaration under Section 564(b)(1) of the Act, 21 U.S.C. section 360bbb-3(b)(1), unless the authorization is terminated or revoked sooner.  Performed at [facility], [place] [address], [place], [place] [address]      Labs: Basic Metabolic Panel: Recent Labs  Lab 07/09/20 1448 07/09/20 1454 07/10/20 0039  NA 142 142 141  K 4.0 4.2 3.9  CL 105 103 106  CO2 27  --  25  GLUCOSE 95 90 97  BUN 18 19 17   CREATININE 0.83 0.70 0.68  CALCIUM 9.5  --  8.7*  MG 2.2  --   --    Liver Function  Tests: Recent Labs  Lab 07/09/20 1448 07/10/20 0039  AST 15 13*  ALT <5 <5  ALKPHOS 63 57  BILITOT 0.6 0.4  PROT 7.3 5.9*  ALBUMIN 4.3 3.5   No results for input(s): LIPASE, AMYLASE in the last 168 hours. No results for input(s): AMMONIA in the last 168 hours. CBC: Recent Labs  Lab 07/09/20 1448 07/09/20 1454 07/10/20 0039  WBC 5.9  --  6.1  NEUTROABS 4.2  --   --   HGB 12.3* 12.6* 11.1*  HCT 38.1* 37.0* 33.4*  MCV 94.5  --  93.6  PLT 181  --  170    CBG: Recent Labs  Lab 07/09/20 1449 07/09/20 2329 07/10/20 0043 07/10/20 0805 07/10/20 1200  GLUCAP 95 72 85 105* 155*       Signed:  Oswald Hillock MD.  [facility] 07/10/2020, 2:33 PM

## 2020-07-10 NOTE — Care Management CC44 (Signed)
Condition Code 44 Documentation Completed  Patient Details  Name: TAYQUAN GASSMAN MRN: [id number] Date of Birth: 1940-09-20   Condition Code 44 given:  Yes Patient signature on Condition Code 44 notice:  Yes Documentation of 2 MD's agreement:  Yes Code 44 added to claim:  Yes    Lia Hopping, LCSW 07/10/2020, 3:09 PM

## 2020-07-12 ENCOUNTER — Telehealth: Payer: Self-pay | Admitting: Neurology

## 2020-07-12 NOTE — Telephone Encounter (Signed)
Spoke with [facility] and gave clarification for patients botox.  ICD ten code provided and clarification given stating injection is given once every ninety days.

## 2020-07-12 NOTE — Progress Notes (Signed)
[place]: [id number] - PA Case ID: [id number] Need help? Call us at [phone] Outcome Approvedon January 22 PA Case: [id number], Status: Approved, Coverage Starts on: 07/10/2020 12:00:00 AM, Coverage Ends on: 06/18/2021 12:00:00 AM. Drug Myobloc 5000UNIT/ML solution Form [employer] 4-Part Electronic PA Form [phone] NCPDP)

## 2020-07-15 ENCOUNTER — Ambulatory Visit: Payer: HMO | Admitting: Occupational Therapy

## 2020-07-15 ENCOUNTER — Telehealth: Payer: Self-pay | Admitting: Neurology

## 2020-07-15 DIAGNOSIS — S065X9A Traumatic subdural hemorrhage with loss of consciousness of unspecified duration, initial encounter: Secondary | ICD-10-CM | POA: Diagnosis not present

## 2020-07-15 DIAGNOSIS — Z09 Encounter for follow-up examination after completed treatment for conditions other than malignant neoplasm: Secondary | ICD-10-CM | POA: Diagnosis not present

## 2020-07-15 DIAGNOSIS — R569 Unspecified convulsions: Secondary | ICD-10-CM | POA: Diagnosis not present

## 2020-07-15 DIAGNOSIS — K219 Gastro-esophageal reflux disease without esophagitis: Secondary | ICD-10-CM | POA: Diagnosis not present

## 2020-07-15 DIAGNOSIS — E119 Type 2 diabetes mellitus without complications: Secondary | ICD-10-CM | POA: Diagnosis not present

## 2020-07-15 NOTE — Telephone Encounter (Signed)
Spoke with patient who states his insurance would not ship any more Botox. Informed patient that Botox is shipped to our office and not to him.   He had questions about his copay. Advised him that I could have someone in our billing dept give him a call to discuss his copay. He voiced understanding.

## 2020-07-15 NOTE — Telephone Encounter (Signed)
Not really

## 2020-07-15 NOTE — Telephone Encounter (Signed)
Patient called his specialty pharmacy to give consent for delivery and they let him know that copay was $295. He did not want to pay that much for the Myobloc. He isn't wanting to go through with it at that price. Patient wanting to let you know and see if you had any recommendations for something else. Thanks!

## 2020-07-16 NOTE — Progress Notes (Deleted)
Assessment/Plan:   1.  Parkinsons Disease  -Continue carbidopa/levodopa 25/100, 2 tablets 3 times per day.  -Declines high-volume lumbar puncture or DaTscan  -Patient to get regular skin checks as Parkinson's increases risk for melanoma.   2.  Sialorrhea  -Patient was approved for Myobloc, but he declined his co-pay, so we could not proceed.  3.  MCI  -Last neurocognitive testing in April, 2021.  4.  Dysphagia  -Last modified barium swallow in January, 2021 with evidence of pharyngeal dysphagia with moderate aspiration risk.  Patient feels that he is doing much better after speech therapy  5.  Constipation  -discussed nature and pathophysiology and association with PD  -discussed importance of hydration.  Pt is to increase water intake  -pt is given a copy of the rancho recipe  -recommended daily colace  -recommended miralax prn  6.  Low blood pressure  -pt to make appt with pcp to see if needs so much losarten  7.  Subdural hematoma.  -I think that the patient had a subdural hematoma, ultimately resulting in seizure.  The subdural hematoma was on the left side of the brain, and the seizure is described as generalized moaning with "muscle rigidity in the right upper extremity."  This certainly correlates clinically.  -We will do routine EEG.  If negative, we will do 48-hour ambulatory EEG.  -For now, we will continue the Keppra, 500 mg twice per day.  Subjective:   Justin Gomez was seen today in follow up for Parkinsons disease and recent hospitalization for subdural hematoma and subsequent seizure.  My previous records were reviewed prior to todays visit as well as outside records available to me. Patient was just seen on January 6 and was doing fairly well at that point in time. He presented to the hospital on January 21 after a fall 4 days prior in which he hit his head. He had slurring of speech. Initial CT of the brain on arrival to the emergency room demonstrated left  subdural hematoma, up to 10 mm. No evidence of midline shift.  While in the emergency room, the neurosurgery PA was seeing the patient and the patient started having what is described as seizure-like activity.  It is described as a 15-second episode of incomprehensible moaning with muscle rigidity in the right upper extremity.  After the episode, the patient looked confused and was unable to communicate for about 10 seconds.  Interestingly they felt that the subdural was not the cause of the presenting symptoms because of the size.  There was no loss of bladder or bowel control.  He had an MRI brain that same day, with similar findings. He had a CT of the head the following day, which was stable (films reviewed).  No EEG was completed at that time.  He represented to the hospital on January 21 with increasing confusion and slurred speech.  While there was a decrease in size of the subdural in the left frontoparietal lobe, there was an acute subdural over the left tentorium, which was likely an extension from the prior subdural.  Patient was seen by neurology.  Current prescribed movement disorder medications: Carbidopa/levodopa 25/100, 2 tablets 3 times per day (just increased on January 6)   ALLERGIES:   Allergies  Allergen Reactions  . Benzonatate Anaphylaxis    Closed throat; difficulty swallowing    CURRENT MEDICATIONS:  Outpatient Encounter Medications as of 07/19/2020  Medication Sig  . atorvastatin (LIPITOR) 20 MG tablet Take 1 tablet (  20 mg total) by mouth daily at 6 PM.  . carbidopa-levodopa (SINEMET IR) 25-100 MG tablet Take 2 tablets by mouth 3 (three) times daily.  . finasteride (PROSCAR) 5 MG tablet Take 5 mg by mouth daily.  Marland Kitchen glipiZIDE (GLUCOTROL) 5 MG tablet Take 5 mg by mouth 2 (two) times daily.   Marland Kitchen levETIRAcetam (KEPPRA) 500 MG tablet Take 1 tablet (500 mg total) by mouth 2 (two) times daily.  Marland Kitchen levothyroxine (SYNTHROID, LEVOTHROID) 75 MCG tablet Take 75 mcg by mouth daily.  Marland Kitchen  losartan (COZAAR) 25 MG tablet Take 25 mg by mouth daily.  . metFORMIN (GLUCOPHAGE-XR) 750 MG 24 hr tablet Take 750 mg by mouth 2 (two) times daily.   . mirabegron ER (MYRBETRIQ) 50 MG TB24 tablet Take 50 mg by mouth daily.   No facility-administered encounter medications on file as of 07/19/2020.    Objective:   PHYSICAL EXAMINATION:    VITALS:   There were no vitals filed for this visit.  GEN:  The patient appears stated age and is in NAD. HEENT:  Normocephalic, atraumatic.  The mucous membranes are moist. The superficial temporal arteries are without ropiness or tenderness. CV:  RRR Lungs:  CTAB Neck/HEME:  There are no carotid bruits bilaterally.  Neurological examination:  Orientation: The patient is alert and oriented x3. Cranial nerves: There is good facial symmetry with facial hypomimia. The speech is fluent and clear. Soft palate rises symmetrically and there is no tongue deviation. Hearing is intact to conversational tone. Sensation: Sensation is intact to light touch throughout Motor: Strength is at least antigravity x4.  Movement examination: Tone: There is mild increased tone in the LUE Abnormal movements: none Coordination:  There is mild decremation with RAM's, with any form of RAMS, including alternating supination and pronation of the forearm, hand opening and closing, finger taps, heel taps and toe taps, L>R Gait and Station: The patient has mild difficulty arising out of a deep-seated chair without the use of the hands. The patient's stride length is decreased and he is flexed at the waist and fesinates.     Total time spent on today's visit was *** minutes, including both face-to-face time and nonface-to-face time.  Time included that spent on review of records (prior notes available to me/labs/imaging if pertinent), discussing treatment and goals, answering patient's questions and coordinating care.  Cc:  Jani Gravel, MD

## 2020-07-16 NOTE — Telephone Encounter (Signed)
LMOM for patient that the reason they wont deliver the medication to our office is because he has to pay the copay first. They would ship to our office but only after the copay amount has been paid. Which is the 295$. Patient does not want to pay that for the [place]. I instructed him to call the SP back and see if there were any copay assistance plans available. SP P#: [phone].   That's the only other option I know for him. Just FYI.

## 2020-07-18 ENCOUNTER — Encounter (HOSPITAL_COMMUNITY): Payer: Self-pay

## 2020-07-18 ENCOUNTER — Inpatient Hospital Stay (HOSPITAL_COMMUNITY)
Admission: EM | Admit: 2020-07-18 | Discharge: 2020-07-27 | DRG: 083 | Disposition: A | Discharge status: Skilled Nursing Facility | Payer: HMO | Attending: Internal Medicine | Admitting: Internal Medicine

## 2020-07-18 ENCOUNTER — Emergency Department (HOSPITAL_COMMUNITY): Payer: HMO

## 2020-07-18 ENCOUNTER — Other Ambulatory Visit: Payer: Self-pay

## 2020-07-18 DIAGNOSIS — G20A1 Parkinson's disease without dyskinesia, without mention of fluctuations: Secondary | ICD-10-CM | POA: Diagnosis present

## 2020-07-18 DIAGNOSIS — S065X9A Traumatic subdural hemorrhage with loss of consciousness of unspecified duration, initial encounter: Secondary | ICD-10-CM | POA: Diagnosis present

## 2020-07-18 DIAGNOSIS — Z9181 History of falling: Secondary | ICD-10-CM | POA: Diagnosis not present

## 2020-07-18 DIAGNOSIS — Z833 Family history of diabetes mellitus: Secondary | ICD-10-CM

## 2020-07-18 DIAGNOSIS — Z8249 Family history of ischemic heart disease and other diseases of the circulatory system: Secondary | ICD-10-CM

## 2020-07-18 DIAGNOSIS — Z79890 Hormone replacement therapy (postmenopausal): Secondary | ICD-10-CM

## 2020-07-18 DIAGNOSIS — I951 Orthostatic hypotension: Secondary | ICD-10-CM | POA: Diagnosis present

## 2020-07-18 DIAGNOSIS — R29810 Facial weakness: Secondary | ICD-10-CM | POA: Diagnosis present

## 2020-07-18 DIAGNOSIS — R471 Dysarthria and anarthria: Secondary | ICD-10-CM | POA: Diagnosis present

## 2020-07-18 DIAGNOSIS — R9431 Abnormal electrocardiogram [ECG] [EKG]: Secondary | ICD-10-CM | POA: Diagnosis not present

## 2020-07-18 DIAGNOSIS — Z7984 Long term (current) use of oral hypoglycemic drugs: Secondary | ICD-10-CM

## 2020-07-18 DIAGNOSIS — G4751 Confusional arousals: Secondary | ICD-10-CM | POA: Diagnosis not present

## 2020-07-18 DIAGNOSIS — N401 Enlarged prostate with lower urinary tract symptoms: Secondary | ICD-10-CM | POA: Diagnosis present

## 2020-07-18 DIAGNOSIS — Z741 Need for assistance with personal care: Secondary | ICD-10-CM | POA: Diagnosis not present

## 2020-07-18 DIAGNOSIS — E039 Hypothyroidism, unspecified: Secondary | ICD-10-CM | POA: Diagnosis present

## 2020-07-18 DIAGNOSIS — S065XAA Traumatic subdural hemorrhage with loss of consciousness status unknown, initial encounter: Secondary | ICD-10-CM | POA: Diagnosis present

## 2020-07-18 DIAGNOSIS — I10 Essential (primary) hypertension: Secondary | ICD-10-CM | POA: Diagnosis present

## 2020-07-18 DIAGNOSIS — G9349 Other encephalopathy: Secondary | ICD-10-CM | POA: Diagnosis present

## 2020-07-18 DIAGNOSIS — I6330 Cerebral infarction due to thrombosis of unspecified cerebral artery: Secondary | ICD-10-CM | POA: Diagnosis present

## 2020-07-18 DIAGNOSIS — R4702 Dysphasia: Secondary | ICD-10-CM | POA: Diagnosis not present

## 2020-07-18 DIAGNOSIS — R58 Hemorrhage, not elsewhere classified: Secondary | ICD-10-CM | POA: Diagnosis not present

## 2020-07-18 DIAGNOSIS — E785 Hyperlipidemia, unspecified: Secondary | ICD-10-CM | POA: Diagnosis present

## 2020-07-18 DIAGNOSIS — Z781 Physical restraint status: Secondary | ICD-10-CM | POA: Diagnosis not present

## 2020-07-18 DIAGNOSIS — R519 Headache, unspecified: Secondary | ICD-10-CM | POA: Diagnosis not present

## 2020-07-18 DIAGNOSIS — Z8673 Personal history of transient ischemic attack (TIA), and cerebral infarction without residual deficits: Secondary | ICD-10-CM | POA: Diagnosis not present

## 2020-07-18 DIAGNOSIS — R350 Frequency of micturition: Secondary | ICD-10-CM | POA: Diagnosis present

## 2020-07-18 DIAGNOSIS — R609 Edema, unspecified: Secondary | ICD-10-CM | POA: Diagnosis not present

## 2020-07-18 DIAGNOSIS — Z20822 Contact with and (suspected) exposure to covid-19: Secondary | ICD-10-CM | POA: Diagnosis present

## 2020-07-18 DIAGNOSIS — K219 Gastro-esophageal reflux disease without esophagitis: Secondary | ICD-10-CM | POA: Diagnosis present

## 2020-07-18 DIAGNOSIS — R1312 Dysphagia, oropharyngeal phase: Secondary | ICD-10-CM | POA: Diagnosis not present

## 2020-07-18 DIAGNOSIS — G20 Parkinson's disease: Secondary | ICD-10-CM | POA: Diagnosis present

## 2020-07-18 DIAGNOSIS — Z79899 Other long term (current) drug therapy: Secondary | ICD-10-CM | POA: Diagnosis not present

## 2020-07-18 DIAGNOSIS — E119 Type 2 diabetes mellitus without complications: Secondary | ICD-10-CM | POA: Diagnosis present

## 2020-07-18 DIAGNOSIS — R41841 Cognitive communication deficit: Secondary | ICD-10-CM | POA: Diagnosis not present

## 2020-07-18 DIAGNOSIS — Z888 Allergy status to other drugs, medicaments and biological substances status: Secondary | ICD-10-CM | POA: Diagnosis not present

## 2020-07-18 DIAGNOSIS — I619 Nontraumatic intracerebral hemorrhage, unspecified: Secondary | ICD-10-CM | POA: Diagnosis present

## 2020-07-18 DIAGNOSIS — W19XXXA Unspecified fall, initial encounter: Secondary | ICD-10-CM | POA: Diagnosis not present

## 2020-07-18 DIAGNOSIS — I629 Nontraumatic intracranial hemorrhage, unspecified: Secondary | ICD-10-CM

## 2020-07-18 DIAGNOSIS — R29818 Other symptoms and signs involving the nervous system: Secondary | ICD-10-CM | POA: Diagnosis not present

## 2020-07-18 DIAGNOSIS — R410 Disorientation, unspecified: Secondary | ICD-10-CM

## 2020-07-18 DIAGNOSIS — R4781 Slurred speech: Secondary | ICD-10-CM | POA: Diagnosis not present

## 2020-07-18 DIAGNOSIS — M6281 Muscle weakness (generalized): Secondary | ICD-10-CM | POA: Diagnosis not present

## 2020-07-18 LAB — COMPREHENSIVE METABOLIC PANEL
ALT: 5 U/L (ref 0–44)
AST: 13 U/L — ABNORMAL LOW (ref 15–41)
Albumin: 3.6 g/dL (ref 3.5–5.0)
Alkaline Phosphatase: 67 U/L (ref 38–126)
Anion gap: 10 (ref 5–15)
BUN: 20 mg/dL (ref 8–23)
CO2: 26 mmol/L (ref 22–32)
Calcium: 9.1 mg/dL (ref 8.9–10.3)
Chloride: 102 mmol/L (ref 98–111)
Creatinine, Ser: 0.79 mg/dL (ref 0.61–1.24)
GFR, Estimated: >60 mL/min (ref >60)
Glucose, Bld: 124 mg/dL — ABNORMAL HIGH (ref 70–99)
Potassium: 4.1 mmol/L (ref 3.5–5.1)
Sodium: 138 mmol/L (ref 135–145)
Total Bilirubin: 1.1 mg/dL (ref 0.3–1.2)
Total Protein: 6.3 g/dL — ABNORMAL LOW (ref 6.5–8.1)

## 2020-07-18 LAB — DIFFERENTIAL
Abs Immature Granulocytes: 0.03 10*3/uL (ref 0.00–0.07)
Basophils Absolute: 0 10*3/uL (ref 0.0–0.1)
Basophils Relative: 0 %
Eosinophils Absolute: 0 10*3/uL (ref 0.0–0.5)
Eosinophils Relative: 1 %
Immature Granulocytes: 0 %
Lymphocytes Relative: 14 %
Lymphs Abs: 1 10*3/uL (ref 0.7–4.0)
Monocytes Absolute: 0.5 10*3/uL (ref 0.1–1.0)
Monocytes Relative: 7 %
Neutro Abs: 5.5 10*3/uL (ref 1.7–7.7)
Neutrophils Relative %: 78 %

## 2020-07-18 LAB — CBC
HCT: 36.3 % — ABNORMAL LOW (ref 39.0–52.0)
Hemoglobin: 11.8 g/dL — ABNORMAL LOW (ref 13.0–17.0)
MCH: 30.3 pg (ref 26.0–34.0)
MCHC: 32.5 g/dL (ref 30.0–36.0)
MCV: 93.1 fL (ref 80.0–100.0)
Platelets: 165 10*3/uL (ref 150–400)
RBC: 3.9 MIL/uL — ABNORMAL LOW (ref 4.22–5.81)
RDW: 13.7 % (ref 11.5–15.5)
WBC: 7 10*3/uL (ref 4.0–10.5)
nRBC: 0 % (ref 0.0–0.2)

## 2020-07-18 LAB — APTT: aPTT: 30 seconds (ref 24–36)

## 2020-07-18 LAB — PROTIME-INR
INR: 1.2 (ref 0.8–1.2)
Prothrombin Time: 14.4 seconds (ref 11.4–15.2)

## 2020-07-18 LAB — SARS CORONAVIRUS 2 (TAT 6-24 HRS): SARS Coronavirus 2: NEGATIVE

## 2020-07-18 MED ORDER — PANTOPRAZOLE SODIUM 40 MG PO TBEC
40.0000 mg | DELAYED_RELEASE_TABLET | Freq: Every day | ORAL | Status: DC
  Administered 2020-07-18 – 2020-07-23 (×5): 40 mg via ORAL
  Filled 2020-07-18 (×6): qty 1

## 2020-07-18 MED ORDER — LEVETIRACETAM 500 MG PO TABS
500.0000 mg | ORAL_TABLET | Freq: Two times a day (BID) | ORAL | Status: DC
  Administered 2020-07-18 – 2020-07-21 (×4): 500 mg via ORAL
  Filled 2020-07-18 (×5): qty 1

## 2020-07-18 MED ORDER — LEVOTHYROXINE SODIUM 75 MCG PO TABS
75.0000 ug | ORAL_TABLET | Freq: Every day | ORAL | Status: DC
  Administered 2020-07-20 – 2020-07-27 (×8): 75 ug via ORAL
  Filled 2020-07-18 (×10): qty 1

## 2020-07-18 MED ORDER — CARBIDOPA-LEVODOPA 25-100 MG PO TABS
2.0000 | ORAL_TABLET | Freq: Three times a day (TID) | ORAL | Status: DC
  Administered 2020-07-18 – 2020-07-27 (×23): 2 via ORAL
  Filled 2020-07-18 (×27): qty 2

## 2020-07-18 MED ORDER — METFORMIN HCL ER 750 MG PO TB24
750.0000 mg | ORAL_TABLET | Freq: Two times a day (BID) | ORAL | Status: DC
  Administered 2020-07-18 – 2020-07-22 (×7): 750 mg via ORAL
  Filled 2020-07-18 (×12): qty 1

## 2020-07-18 MED ORDER — SODIUM CHLORIDE 0.9% FLUSH
3.0000 mL | Freq: Two times a day (BID) | INTRAVENOUS | Status: DC
  Administered 2020-07-18 – 2020-07-27 (×10): 3 mL via INTRAVENOUS

## 2020-07-18 MED ORDER — ACETAMINOPHEN 325 MG PO TABS
650.0000 mg | ORAL_TABLET | Freq: Four times a day (QID) | ORAL | Status: DC | PRN
  Administered 2020-07-25: 650 mg via ORAL
  Filled 2020-07-18 (×2): qty 2

## 2020-07-18 MED ORDER — MIRABEGRON ER 25 MG PO TB24
50.0000 mg | ORAL_TABLET | Freq: Every day | ORAL | Status: DC
  Administered 2020-07-18 – 2020-07-27 (×8): 50 mg via ORAL
  Filled 2020-07-18: qty 1
  Filled 2020-07-18 (×4): qty 2
  Filled 2020-07-18: qty 1
  Filled 2020-07-18 (×4): qty 2

## 2020-07-18 MED ORDER — LOSARTAN POTASSIUM 25 MG PO TABS
25.0000 mg | ORAL_TABLET | Freq: Every day | ORAL | Status: DC
  Administered 2020-07-18 – 2020-07-27 (×9): 25 mg via ORAL
  Filled 2020-07-18 (×10): qty 1

## 2020-07-18 MED ORDER — ATORVASTATIN CALCIUM 10 MG PO TABS
20.0000 mg | ORAL_TABLET | Freq: Every day | ORAL | Status: DC
  Administered 2020-07-20 – 2020-07-26 (×7): 20 mg via ORAL
  Filled 2020-07-18 (×7): qty 2

## 2020-07-18 MED ORDER — ACETAMINOPHEN 650 MG RE SUPP: 650.0000 mg | Freq: Four times a day (QID) | RECTAL | Status: DC | PRN

## 2020-07-18 MED ORDER — SODIUM CHLORIDE 0.9% FLUSH
3.0000 mL | Freq: Once | INTRAVENOUS | Status: AC
  Administered 2020-07-18: 3 mL via INTRAVENOUS

## 2020-07-18 MED ORDER — FINASTERIDE 5 MG PO TABS
5.0000 mg | ORAL_TABLET | Freq: Every day | ORAL | Status: DC
  Administered 2020-07-20 – 2020-07-27 (×8): 5 mg via ORAL
  Filled 2020-07-18 (×9): qty 1

## 2020-07-18 MED ORDER — POLYETHYLENE GLYCOL 3350 17 G PO PACK
17.0000 g | PACK | Freq: Every day | ORAL | Status: DC | PRN
  Filled 2020-07-18: qty 1

## 2020-07-18 NOTE — ED Notes (Signed)
Notified EDP of pt bp.

## 2020-07-18 NOTE — ED Triage Notes (Signed)
Patient had fall last week and seen at [facility] ED and noted to have bleed. Patient has had slurred speech and difficulty speaking since the fall. rescanned at [facility] ED on 1/22 for same. Patient moves all extremities. Equal grip, also has parkinsons disease, denies pain

## 2020-07-18 NOTE — ED Provider Notes (Signed)
[facility] EMERGENCY DEPARTMENT Provider Note   CSN: [id number] Arrival date & time: 07/18/20  1120     History No chief complaint on file.   Justin Gomez is a 80 y.o. male.  HPI Patient here for evaluation of increasing confusion and trouble talking over the last 48 hours.  His wife describes the symptoms as coming and going.  Patient's wife specifies confusion including taking cornbread with ice cream and putting a tray of cornbread in the freezer.  Also last night he was in the bathroom urinating, dribbled, and picked up a rug from the floor to use as a towel to clean himself.  His wife thought that this was unusual because he usually can use a towel, when needed.  Patient attempts to describe his symptoms but he has severe dysarthria making it difficult to understand him.  He was hospitalized with intracranial bleeding, on 07/09/2020 following a fall, 4 days prior.  He was observed and discharged following MRI imaging.  He was started on Keppra.  He has apparently had 1 seizure, while he was in the hospital but none since according to the wife.  He has been otherwise well.  He is taking his usual medications including Keppra.  There are no other known modifying factors.    Past Medical History:  Diagnosis Date  . BPH (benign prostatic hypertrophy)   . 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 Diverticulosis   . Essential hypertension 11/29/2015  . Gastroesophageal reflux disease 04/13/2016  . Hiatal hernia   . Hyperlipidemia   . Hypothyroidism   . Lumbar herniated disc    L4  . Mild neurocognitive disorder, unclear etiology 09/24/2019  . Multiple system atrophy   . Parkinson's disease 07/31/2017  . Postural dizziness with near syncope 07/14/2017  . Postural hypotension 01/22/2017  . 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 Type 2 diabetes mellitus without complication, without long-term current use of insulin ([place])  11/29/2015    Patient Active Problem List   Diagnosis Date Noted  . SDH (subdural hematoma) ([place]) 07/09/2020  . Mild neurocognitive disorder, unclear etiology 09/24/2019  . Parkinson's disease 07/31/2017  . 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 Fall   . Multiple system atrophy   . 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 Postural dizziness with near syncope 07/14/2017  . Postural hypotension 01/22/2017  . Gastroesophageal reflux disease 04/13/2016  . Cough 04/13/2016  . 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 Essential hypertension 11/29/2015  . Type 2 diabetes mellitus without complication, without long-term current use of insulin ([place]) 11/29/2015    Past Surgical History:  Procedure Laterality Date  . APPENDECTOMY  1956  . KNEE SURGERY  2009   right  . SHOULDER SURGERY  2010   left       Family History  Problem Relation Age of Onset  . Cerebral aneurysm Father   . Hypertension Father   . Diabetes Mother   . Hypertension Brother   . Diabetes Sister   . Hypertension Sister   . Healthy Son   . Diverticulitis Daughter   . Colon cancer Neg Hx     Social History   Tobacco Use  . Smoking status: Never Smoker  . Smokeless tobacco: Never Used  Vaping Use  . Vaping Use: Never used  Substance Use Topics  . Alcohol use: Yes    Comment: occasionally  . Drug use: No  Home Medications Prior to Admission medications   Medication Sig Start Date End Date Taking? Authorizing Provider  atorvastatin (LIPITOR) 20 MG tablet Take 1 tablet (20 mg total) by mouth daily at 6 PM. 07/16/17  Yes Hall, Carole N, DO  carbidopa-levodopa (SINEMET IR) 25-100 MG tablet Take 2 tablets by mouth 3 (three) times daily. 06/24/20  Yes Tat, Eustace Quail, DO  finasteride (PROSCAR) 5 MG tablet Take 5 mg by mouth daily.   Yes [provider]  levETIRAcetam (KEPPRA) 500 MG tablet Take 1 tablet (500 mg total) by mouth 2 (two) times daily. 07/10/20   Yes Oswald Hillock, MD  levothyroxine (SYNTHROID, LEVOTHROID) 75 MCG tablet Take 75 mcg by mouth daily.   Yes [provider]  losartan (COZAAR) 25 MG tablet Take 25 mg by mouth daily.   Yes [provider]  metFORMIN (GLUCOPHAGE-XR) 750 MG 24 hr tablet Take 750 mg by mouth 2 (two) times daily.  11/03/15  Yes [provider]  mirabegron ER (MYRBETRIQ) 50 MG TB24 tablet Take 50 mg by mouth daily.   Yes [provider]  pantoprazole (PROTONIX) 40 MG tablet Take 40 mg by mouth daily.   Yes [provider]    Allergies    Benzonatate  Review of Systems   Review of Systems  All other systems reviewed and are negative.   Physical Exam Updated Vital Signs BP (!) 170/72   Pulse 78   Temp 98.4 F (36.9 C) (Oral)   Resp 18   SpO2 100%   Physical Exam Vitals and nursing note reviewed.  Constitutional:      General: He is not in acute distress.    Appearance: He is well-developed and well-nourished. He is not ill-appearing, toxic-appearing or diaphoretic.     Comments: Elderly, frail  HENT:     Head: Normocephalic and atraumatic.     Right Ear: External ear normal.     Left Ear: External ear normal.  Eyes:     Extraocular Movements: EOM normal.     Conjunctiva/sclera: Conjunctivae normal.     Pupils: Pupils are equal, round, and reactive to light.  Neck:     Trachea: Phonation normal.  Cardiovascular:     Rate and Rhythm: Normal rate and regular rhythm.     Heart sounds: Normal heart sounds.  Pulmonary:     Effort: Pulmonary effort is normal.     Breath sounds: Normal breath sounds.  Chest:     Chest wall: No bony tenderness.  Abdominal:     General: There is no distension.     Palpations: Abdomen is soft.     Tenderness: There is no abdominal tenderness.  Musculoskeletal:        General: Normal range of motion.     Cervical back: Normal range of motion and neck supple.  Skin:    General: Skin is warm, dry and intact.   Neurological:     Mental Status: He is alert.     Cranial Nerves: No cranial nerve deficit.     Sensory: No sensory deficit.     Motor: No abnormal muscle tone.     Coordination: Coordination normal.     Comments: Dysarthria present.  No clear evidence for aphasia.  Patient seems responsive and understanding of the situation.  He attempts to respond, but it is very difficult to understand him.  Psychiatric:        Mood and Affect: Mood and affect and mood normal.  Behavior: Behavior normal.     ED Results / Procedures / Treatments   Labs (all labs ordered are listed, but only abnormal results are displayed) Labs Reviewed  CBC - Abnormal; Notable for the following components:      Result Value   RBC 3.90 (*)    Hemoglobin 11.8 (*)    HCT 36.3 (*)    All other components within normal limits  COMPREHENSIVE METABOLIC PANEL - Abnormal; Notable for the following components:   Glucose, Bld 124 (*)    Total Protein 6.3 (*)    AST 13 (*)    All other components within normal limits  SARS CORONAVIRUS 2 (TAT 6-24 HRS)  PROTIME-INR  APTT  DIFFERENTIAL  CBG MONITORING, ED    EKG None  Radiology CT HEAD WO CONTRAST  Result Date: 07/18/2020 CLINICAL DATA:  Headache. Intracranial hemorrhage suspected. Fall last week, with slurred speech and difficulty speaking since fall. EXAM: CT HEAD WITHOUT CONTRAST TECHNIQUE: Contiguous axial images were obtained from the base of the skull through the vertex without intravenous contrast. COMPARISON:  Head CT dated 07/10/2020 FINDINGS: Brain: Ventricles are stable in size and configuration. Mild chronic small vessel ischemic changes noted within the bilateral periventricular white matter regions. Slight decrease in size of the subdural hemorrhage overlying the LEFT frontoparietal lobe compared to head CT of 07/10/2020, and also decreased in density per expected aging (now subacute in appearance). There is now a thin amount of acute-appearing  subdural hemorrhage overlying the LEFT tentorium, likely extension from the aforementioned hemorrhage overlying the LEFT frontoparietal lobe. No evidence of additional acute intracranial hemorrhage. No midline shift or herniation. Vascular: Chronic calcified atherosclerotic changes of the large vessels at the skull base. Focal heavy atherosclerotic calcification of the LEFT upper vertebral artery, versus stent. No unexpected hyperdense vessel. Skull: Normal. Negative for fracture or focal lesion. Sinuses/Orbits: No acute finding. Other: Fairly large amount of cerumen within the external auditory canal regions bilaterally. IMPRESSION: 1. Slight decrease in size of the subdural hemorrhage overlying the LEFT frontoparietal lobe compared to head CT of 07/10/2020, and also decreased in density per expected aging (now subacute in appearance). Mild mass effect with sulcal effacement in the LEFT frontoparietal lobe, but no midline shift or herniation. 2. There is now a thin amount of acute-appearing subdural hemorrhage seen overlying the LEFT tentorium, likely interval extension from the aforementioned hemorrhage overlying the LEFT frontoparietal lobe. 3. No evidence of additional acute intracranial hemorrhage. 4. Mild chronic small vessel ischemic changes within the white matter. Electronically Signed   By: Franki Cabot M.D.   On: 07/18/2020 12:01    Procedures Procedures   Medications Ordered in ED Medications  sodium chloride flush (NS) 0.9 % injection 3 mL (has no administration in time range)  carbidopa-levodopa (SINEMET IR) 25-100 MG per tablet immediate release 2 tablet (has no administration in time range)  levETIRAcetam (KEPPRA) tablet 500 mg (has no administration in time range)  levothyroxine (SYNTHROID) tablet 75 mcg (has no administration in time range)  losartan (COZAAR) tablet 25 mg (has no administration in time range)  metFORMIN (GLUCOPHAGE-XR) 24 hr tablet 750 mg (has no administration in  time range)  mirabegron ER (MYRBETRIQ) tablet 50 mg (has no administration in time range)  pantoprazole (PROTONIX) EC tablet 40 mg (has no administration in time range)    ED Course  I have reviewed the triage vital signs and the nursing notes.  Pertinent labs & imaging results that were available during my care of the  patient were reviewed by me and considered in my medical decision making (see chart for details).  Clinical Course as of 07/18/20 2012  Sun Jul 18, 2020  1623 I discussed the situation with Dr. Marcello Moores, neurosurgeon on-call.  He states that patient has additional bleeding, likely related to the initial bleed, not a new bleed.  He recommends hospitalization for "neurologic testing." [EW]  [id number] Case discussed with neurology, Dr. Curly Shores.  She recommends EEG, and close monitoring, and contact her afterwards. [EW]  1953 It appears the EEG did not pick up the order for the EEG so has not been done yet.  Secretary has now contacted on-call numbers to arrange for EEG. [EW]  2006 She did call back from Dr. Cheral Marker, who is now on-call for neurology.  He has a different opinion, such that he recommends patient be placed in the hospital for observation, and obtain EEG, tomorrow.  He will see the patient formally and get a consultation.  I will call the hospital doctor to admit the patient for monitoring. [EW]    Clinical Course User Index [EW] Daleen Bo, MD   MDM Rules/Calculators/A&P                           Patient Vitals for the past 24 hrs:  BP Temp Temp src Pulse Resp SpO2  07/18/20 2000 (!) 170/72 -- -- 78 18 100 %  07/18/20 1900 (!) 175/81 -- -- 78 12 100 %  07/18/20 1800 (!) 166/74 -- -- 67 13 98 %  07/18/20 1700 (!) 175/86 -- -- 80 14 100 %  07/18/20 1630 (!) 178/87 -- -- 76 15 100 %  07/18/20 1545 (!) 176/80 -- -- 74 13 100 %  07/18/20 1422 (!) 149/73 98.4 F (36.9 C) Oral 73 16 96 %  07/18/20 1125 126/64 97.9 F (36.6 C) Oral 69 16 100 %    8:07 PM  Reevaluation with update and discussion. After initial assessment and treatment, an updated evaluation reveals no change in clinical status.  Family updated on findings and plan. Daleen Bo   Medical Decision Making:  This patient is presenting for evaluation of confusion and dysarthria, which moderate require a range of treatment options, and is a complaint that involves a moderate risk of morbidity and mortality. The differential diagnoses include new CVA, increased bleeding, metabolic disorder, acute illness. I decided to review old records, and in summary elderly male, currently residing at home, with recent intracranial bleeding, now progressive symptoms, 2 weeks later..  I obtained additional historical information from wife at bedside.  Clinical Laboratory Tests Ordered, included CBC and Metabolic panel. Review indicates normal except glucose high, total protein low, AST low, hemoglobin low. Radiologic Tests Ordered, included CT head.  I independently Visualized: CT images, which show progression of prior intracranial bleeding, likely evolution  Cardiac Monitor Tracing which shows normal sinus rhythm  Critical Interventions-clinical evaluation, CT imaging, laboratory testing, discussion with neurology, observation reassessment.  After These Interventions, the Patient was reevaluated and was found to require hospitalization for stabilization.  An EEG was ordered, it could not be done initially, and the patient ultimately required hospitalization with formal neurology consultation, to ensure stability and complete work-up, expectantly while in the hospital.  CRITICAL CARE-no Performed by: Daleen Bo  Nursing Notes Reviewed/ Care Coordinated Applicable Imaging Reviewed Interpretation of Laboratory Data incorporated into ED treatment  8:10 PM-Consult complete with hospitalist. Patient case explained and discussed. He agrees to admit  patient for further evaluation and treatment. Call  ended at 8:25 PM  Plan: Admit    Final Clinical Impression(s) / ED Diagnoses Final diagnoses:  Intracranial bleed ([place])  Confusion    Rx / DC Orders ED Discharge Orders    None       Daleen Bo, MD 07/18/20 2026

## 2020-07-18 NOTE — H&P (Signed)
History and Physical   Justin Gomez:[id number] DOB: 1940/08/09 DOA: 07/18/2020  PCP: Justin Gravel, MD   Patient coming from: Home  Chief Complaint: Intermittent confusion and dysarthria  HPI: Justin Gomez is a 80 y.o. male with medical history significant of CVA, hypertension, GERD, Parkinson's disease, postural hypotension,'s diabetes, hypothyroidism, recent admission for subdural hematoma who presents with some intermittent confusion and difficulty speaking for the past 2 days.  Patient was recently admitted from 1/21-1/22 after he had a fall and hit his head and was noted to have a subdural hematoma and associated slurred speech.  A repeat CT the following day was stable and there was no indication for surgical intervention and his slurred speech was improving so he was discharged home.  He was discharged on Keppra due to a 15-second seizure-like episode in the ED during that admission.  He has been taking all prescribed medications including Keppra at home.  Patient reports 2 days of increased intermittent confusion and difficulty speaking as above he states it has been there more often than not.  His wife reported  episodes where he tried to use a rug to clean himself after dribbling urine in the bathroom as well as putting things in the freezer which he should not.  He is also has intermittent dysarthria and word finding difficulty as above. Reports chronic cough and chronic frequent urination and urinary incontinence.  Denies fevers, chills, chest pain, shortness of breath, abdominal pain, constipation, nausea, vomiting, diarrhea.  ED Course: In the ED vitals significant for blood pressure in the 272Z to 366 systolic.  Lab work-up showed normal CMP except for glucose 124 and protein 6.3.  CBC showed hemoglobin 11.8 which is stable.  PT, PTT INR normal.  Respiratory panel for Covid pending.  CT head showed a slight decrease of subdural hematoma at the left frontal parietal region which as  above is decreased from 1/22.  There is a new thin acute subdural hematoma at the left tentorium which is consistent with an extension of the previous bleed.  No new/acute bleed suspected.  Neurosurgery consulted by EDP who states that due to this being an extension and not significant in size there is no indication for operative intervention, some irritation may be causing his waxing and waning symptoms.  Neurology was consulted and recommended observation and EEG.  Review of Systems: As per HPI otherwise all other systems reviewed and are negative.  Past Medical History:  Diagnosis Date  . BPH (benign prostatic hypertrophy)   . 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 Diverticulosis   . Essential hypertension 11/29/2015  . Gastroesophageal reflux disease 04/13/2016  . Hiatal hernia   . Hyperlipidemia   . Hypothyroidism   . Lumbar herniated disc    L4  . Mild neurocognitive disorder, unclear etiology 09/24/2019  . Multiple system atrophy   . Parkinson's disease 07/31/2017  . Postural dizziness with near syncope 07/14/2017  . Postural hypotension 01/22/2017  . 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 Type 2 diabetes mellitus without complication, without long-term current use of insulin ([place]) 11/29/2015    Past Surgical History:  Procedure Laterality Date  . APPENDECTOMY  1956  . KNEE SURGERY  2009   right  . SHOULDER SURGERY  2010   left    Social History  reports that he has never smoked. He has never used smokeless tobacco. He reports current alcohol use. He reports that he does not use drugs.  Allergies  Allergen Reactions  . Benzonatate Anaphylaxis    Closed throat; difficulty swallowing    Family History  Problem Relation Age of Onset  . Cerebral aneurysm Father   . Hypertension Father   . Diabetes Mother   . Hypertension Brother   . Diabetes Sister   . Hypertension Sister   . Healthy Son   . Diverticulitis Daughter    . Colon cancer Neg Hx   Reviewed on admission  Prior to Admission medications   Medication Sig Start Date End Date Taking? Authorizing Provider  atorvastatin (LIPITOR) 20 MG tablet Take 1 tablet (20 mg total) by mouth daily at 6 PM. 07/16/17  Yes Hall, Carole N, DO  carbidopa-levodopa (SINEMET IR) 25-100 MG tablet Take 2 tablets by mouth 3 (three) times daily. 06/24/20  Yes Tat, Eustace Quail, DO  finasteride (PROSCAR) 5 MG tablet Take 5 mg by mouth daily.   Yes [provider]  levETIRAcetam (KEPPRA) 500 MG tablet Take 1 tablet (500 mg total) by mouth 2 (two) times daily. 07/10/20  Yes Oswald Hillock, MD  levothyroxine (SYNTHROID, LEVOTHROID) 75 MCG tablet Take 75 mcg by mouth daily.   Yes [provider]  losartan (COZAAR) 25 MG tablet Take 25 mg by mouth daily.   Yes [provider]  metFORMIN (GLUCOPHAGE-XR) 750 MG 24 hr tablet Take 750 mg by mouth 2 (two) times daily.  11/03/15  Yes [provider]  mirabegron ER (MYRBETRIQ) 50 MG TB24 tablet Take 50 mg by mouth daily.   Yes [provider]  pantoprazole (PROTONIX) 40 MG tablet Take 40 mg by mouth daily.   Yes [provider]    Physical Exam: Vitals:   07/18/20 1700 07/18/20 1800 07/18/20 1900 07/18/20 2000  BP: (!) 175/86 (!) 166/74 (!) 175/81 (!) 170/72  Pulse: 80 67 78 78  Resp: 14 13 12 18   Temp:      TempSrc:      SpO2: 100% 98% 100% 100%   Physical Exam Constitutional:      General: He is not in acute distress.    Appearance: Normal appearance.  HENT:     Head: Normocephalic and atraumatic.     Mouth/Throat:     Mouth: Mucous membranes are moist.     Pharynx: Oropharynx is clear.  Eyes:     Extraocular Movements: Extraocular movements intact.     Pupils: Pupils are equal, round, and reactive to light.  Cardiovascular:     Rate and Rhythm: Normal rate and regular rhythm.     Pulses: Normal pulses.     Heart sounds: Normal heart sounds.  Pulmonary:     Effort:  Pulmonary effort is normal. No respiratory distress.     Breath sounds: Normal breath sounds.  Abdominal:     General: Bowel sounds are normal. There is no distension.     Palpations: Abdomen is soft.     Tenderness: There is no abdominal tenderness.  Musculoskeletal:        General: No swelling or deformity.  Skin:    General: Skin is warm and dry.  Neurological:     Mental Status: Mental status is at baseline.     Comments: Mental Status: Patient is awake, alert, oriented Some mild word finding difficulty at times.  Some slurred speech. No signs of neglect. Cranial Nerves: II: Pupils equal, round, and reactive to light.  III,IV, VI: EOMI without ptosis or diploplia.  V: Facial sensation is symmetric tolight touch. VII: Facial  movement is symmetric.  VIII: hearing is intact to voice X: Uvula elevates symmetrically XI: Shoulder shrug is symmetric. XII: tongue is midline without atrophy or fasciculations.  Motor: Good effort thorughout, at Least 5/5 bilateral UE, 5/5 bilateral lower extremitiy Sensory: Sensation is grossly intact bilateral UEs & LEs    Labs on Admission: I have personally reviewed following labs and imaging studies  CBC: Recent Labs  Lab 07/18/20 1136  WBC 7.0  NEUTROABS 5.5  HGB 11.8*  HCT 36.3*  MCV 93.1  PLT 191    Basic Metabolic Panel: Recent Labs  Lab 07/18/20 1136  NA 138  K 4.1  CL 102  CO2 26  GLUCOSE 124*  BUN 20  CREATININE 0.79  CALCIUM 9.1    GFR: Estimated Creatinine Clearance: 76.6 mL/min (by C-G formula based on SCr of 0.79 mg/dL).  Liver Function Tests: Recent Labs  Lab 07/18/20 1136  AST 13*  ALT 5  ALKPHOS 67  BILITOT 1.1  PROT 6.3*  ALBUMIN 3.6    Urine analysis:    Component Value Date/Time   COLORURINE YELLOW 07/09/2020 1558   APPEARANCEUR CLEAR 07/09/2020 1558   LABSPEC 1.015 07/09/2020 1558   PHURINE 5.0 07/09/2020 [place] 07/09/2020 1558   HGBUR NEGATIVE 07/09/2020 1558    BILIRUBINUR NEGATIVE 07/09/2020 1558   KETONESUR 5 (A) 07/09/2020 1558   PROTEINUR NEGATIVE 07/09/2020 1558   NITRITE NEGATIVE 07/09/2020 1558   LEUKOCYTESUR NEGATIVE 07/09/2020 1558    Radiological Exams on Admission: CT HEAD WO CONTRAST  Result Date: 07/18/2020 CLINICAL DATA:  Headache. Intracranial hemorrhage suspected. Fall last week, with slurred speech and difficulty speaking since fall. EXAM: CT HEAD WITHOUT CONTRAST TECHNIQUE: Contiguous axial images were obtained from the base of the skull through the vertex without intravenous contrast. COMPARISON:  Head CT dated 07/10/2020 FINDINGS: Brain: Ventricles are stable in size and configuration. Mild chronic small vessel ischemic changes noted within the bilateral periventricular white matter regions. Slight decrease in size of the subdural hemorrhage overlying the LEFT frontoparietal lobe compared to head CT of 07/10/2020, and also decreased in density per expected aging (now subacute in appearance). There is now a thin amount of acute-appearing subdural hemorrhage overlying the LEFT tentorium, likely extension from the aforementioned hemorrhage overlying the LEFT frontoparietal lobe. No evidence of additional acute intracranial hemorrhage. No midline shift or herniation. Vascular: Chronic calcified atherosclerotic changes of the large vessels at the skull base. Focal heavy atherosclerotic calcification of the LEFT upper vertebral artery, versus stent. No unexpected hyperdense vessel. Skull: Normal. Negative for fracture or focal lesion. Sinuses/Orbits: No acute finding. Other: Fairly large amount of cerumen within the external auditory canal regions bilaterally. IMPRESSION: 1. Slight decrease in size of the subdural hemorrhage overlying the LEFT frontoparietal lobe compared to head CT of 07/10/2020, and also decreased in density per expected aging (now subacute in appearance). Mild mass effect with sulcal effacement in the LEFT frontoparietal lobe,  but no midline shift or herniation. 2. There is now a thin amount of acute-appearing subdural hemorrhage seen overlying the LEFT tentorium, likely interval extension from the aforementioned hemorrhage overlying the LEFT frontoparietal lobe. 3. No evidence of additional acute intracranial hemorrhage. 4. Mild chronic small vessel ischemic changes within the white matter. Electronically Signed   By: Franki Cabot M.D.   On: 07/18/2020 12:01   EKG: Independently reviewed.  Normal sinus rhythm at 70 bpm.  Minimal baseline wander V2.  Assessment/Plan Principal Problem:   Intracranial bleeding (HCC) Active Problems:  Essential hypertension   Type 2 diabetes mellitus without complication, without long-term current use of insulin (HCC)   Gastroesophageal reflux disease   Postural hypotension   Cerebral thrombosis with cerebral infarction   Parkinson's disease   SDH (subdural hematoma) (HCC)  Intracranial bleeding Recent subdural hematoma > As above patient admitted from 1/21-1/25 with subdural hematoma after fall where he hit his head with some slurred speech on presentation at that time.  Discharge next day due to stable CT and improved slurred speech.  Discharged on Keppra due to a 15-second seizure-like episode. > Has had 2 days of intermittent confusion and difficulty speaking. > Neurosurgery consulted in ED who states this is a nonoperative case due to minimal bleeding and expected extension on CT head (which did show decrease in initial area of bleeding with some new bleeding in tentorium).  > Neurology consulted who recommended observation and EEG, at this time recommending EEG to be in the morning.  Neurology states they will place a formal consult. - Appreciate neurology's assistance with this patient - Monitor on telemetry - Seizure precautions - EEG in the a.m. - Continue Keppra  Parkinson's disease - Continue home Sinemet  Urinary frequency and incotninence - Continue Myrbetriq  and finasteride - Place external urinary catheter  Diabetes - Continue Metformi - CBG monitoring  Hypertension - Continue home losartan  History of CVA - Continue home atorvastatin  GERD - Continue home PPI  Hypothyroidism - Continue home Synthroid  DVT prophylaxis: SCDs Code Status:   Full  Family Communication:  Wife called at [phone] and updated Disposition Plan:   Patient is from:  Home  Anticipated DC to:  Home  Anticipated DC date:  1 to 3 days  Anticipated DC barriers: None  Consults called:  Neurology consulted by EDP and will see the patient.  Neurosurgery spoke with the EDP by phone and will not be seeing the patient.  Admission status:  Observation, telemetry  Severity of Illness: The appropriate patient status for this patient is OBSERVATION. Observation status is judged to be reasonable and necessary in order to provide the required intensity of service to ensure the patient's safety. The patient's presenting symptoms, physical exam findings, and initial radiographic and laboratory data in the context of their medical condition is felt to place them at decreased risk for further clinical deterioration. Furthermore, it is anticipated that the patient will be medically stable for discharge from the hospital within 2 midnights of admission. The following factors support the patient status of observation.   " The patient's presenting symptoms include intermittent dysarthria and confusion. " The physical exam findings include mild word finding difficulty and slurred speech. " The initial radiographic and laboratory data are significant for extension of prior subdural hematoma to left tentorium though with this extension shows just a thin subdural hematoma.Marcelyn Bruins MD [facility]  How to contact the [facility] Attending or Consulting provider [place] or covering provider during after hours [place], for this patient?   1. Check the care team in [facility]  and look for a) attending/consulting [facility] provider listed and b) the [facility] team listed 2. Log into [email or web address] and use [place]'s universal password to access. If you do not have the password, please contact the hospital operator. 3. Locate the [facility] provider you are looking for under [facility] and page to a number that you can be directly reached. 4. If you still have difficulty reaching the provider, please page the  DOC (Director on Call) for the Hospitalists listed on amion for assistance.  07/18/2020, 9:16 PM

## 2020-07-18 NOTE — Consult Note (Addendum)
NEURO HOSPITALIST CONSULT NOTE   Requestig physician: Dr. Trilby Drummer  Reason for Consult: Altered mental status  History obtained from:  Patient and Chart    HPI:                                                                                                                                          Justin Gomez is a 80 year old male with a PMHx of Parkinson's disease, multisystem atrophy, postural hypotension, stroke, HTN, HLD, hypothyroidism and DM2 who sustained a recent traumatic left sided subdural hematoma on 1/21. He did not undergo surgical intervention at that time. He re-presents to the ED with slurred speech and difficulty speaking since the fall.   His wife describes him as having fluctuating levels of confusion and trouble talking over the past 48 hours. Wife further specified his confusion as including taking cornbread with ice cream and putting a tray of cornbread in the freezer.  Also last night he was in the bathroom urinating, dribbled, and picked up a rug from the floor to use as a towel to clean himself.  His wife thought that this was unusual because he usually can use a towel, when needed.   Of note, the patient had a seizure after his initial admission for the subdural. He had been started on Keppra and has been compliant with such.    CT head in the ED this visit reveals a slight decrease in size of the subdural hemorrhage overlying the LEFT frontoparietal lobe compared to head CT of 07/10/2020. However, there is now a thin amount of acute-appearing subdural hemorrhage seen overlying the LEFT tentorium, likely interval extension from the aforementioned hemorrhage overlying the LEFT frontoparietal lobe.  Neurology was called to further evaluate.   Home medications include Sinemet.   Past Medical History:  Diagnosis Date  . BPH (benign prostatic hypertrophy)   . 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 Common peroneal neuropathy  02/07/2016  . Diverticulosis   . Essential hypertension 11/29/2015  . Gastroesophageal reflux disease 04/13/2016  . Hiatal hernia   . Hyperlipidemia   . Hypothyroidism   . Lumbar herniated disc    L4  . Mild neurocognitive disorder, unclear etiology 09/24/2019  . Multiple system atrophy   . Parkinson's disease 07/31/2017  . Postural dizziness with near syncope 07/14/2017  . Postural hypotension 01/22/2017  . 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 Type 2 diabetes mellitus without complication, without long-term current use of insulin ([place]) 11/29/2015    Past Surgical History:  Procedure Laterality Date  . APPENDECTOMY  1956  . KNEE SURGERY  2009   right  . SHOULDER SURGERY  2010   left    Family History  Problem Relation Age of Onset  . Cerebral aneurysm Father   .  Hypertension Father   . Diabetes Mother   . Hypertension Brother   . Diabetes Sister   . Hypertension Sister   . Healthy Son   . Diverticulitis Daughter   . Colon cancer Neg Hx               Social History:  reports that he has never smoked. He has never used smokeless tobacco. He reports current alcohol use. He reports that he does not use drugs.  Allergies  Allergen Reactions  . Benzonatate Anaphylaxis    Closed throat; difficulty swallowing    HOME MEDICATIONS:                                                                                                                      No current facility-administered medications on file prior to encounter.   Current Outpatient Medications on File Prior to Encounter  Medication Sig Dispense Refill  . atorvastatin (LIPITOR) 20 MG tablet Take 1 tablet (20 mg total) by mouth daily at 6 PM. 30 tablet 0  . carbidopa-levodopa (SINEMET IR) 25-100 MG tablet Take 2 tablets by mouth 3 (three) times daily. 540 tablet 1  . finasteride (PROSCAR) 5 MG tablet Take 5 mg by mouth daily.    Marland Kitchen levETIRAcetam (KEPPRA) 500 MG tablet Take 1 tablet (500 mg total) by mouth 2 (two) times daily. 60  tablet 3  . levothyroxine (SYNTHROID, LEVOTHROID) 75 MCG tablet Take 75 mcg by mouth daily.    Marland Kitchen losartan (COZAAR) 25 MG tablet Take 25 mg by mouth daily.    . metFORMIN (GLUCOPHAGE-XR) 750 MG 24 hr tablet Take 750 mg by mouth 2 (two) times daily.     . mirabegron ER (MYRBETRIQ) 50 MG TB24 tablet Take 50 mg by mouth daily.    . pantoprazole (PROTONIX) 40 MG tablet Take 40 mg by mouth daily.      ROS:                                                                                                                                       No fevers, chills, SOB, N/V or CP. Denies any focal weakness or sensory changes.    Blood pressure (!) 170/72, pulse 78, temperature 98.4 F (36.9 C), temperature source Oral, resp. rate 18, SpO2 100 %.   General Examination:  Physical Exam  HEENT-  [place]/AT   Lungs- Respirations unlabored Extremities- No edema  Neurological Examination Mental Status: Awake and alert. Speech is severely dysarthric and mildly hypophonic, but fluent with intact naming and comprehension. He tends to speak tangentially when answering open-ended questions, but with answers to focused questions his thought process is linear. The patient has good insight regarding the reason for his admission. He is pleasant and cooperative. No dyscalculia. Concentration is intact with patient able to recite the months of the year forwards and backwards without difficulty. Able to abstractly categorize a bicycle and a car as things that you ride. Good fund of knowledge: Knows the capital of [place] and the countries bordering the [place] to the [place] and [place].  Cranial Nerves: II: Visual fields intact with no extinction to DSS. PERRL.  III,IV, VI: No ptosis. EOMI but with saccadic quality.  V,VII: Smile is symmetric, but with hypomimia. Temp sensation equal bilaterally  VIII: hearing intact to voice IX,X:  Hypophonic speech.  XI: Symmetric XII: Midline tongue extension Motor: Right : Upper extremity   5/5    Left:     Upper extremity   5/5  Lower extremity   5/5     Lower extremity   5/5 Movements are mildly bradykinetic and tone is also mildly increased x 4 Sensory: Temp and light touch intact throughout, bilaterally Deep Tendon Reflexes: 2+ bilateral brachioradialis and patellae.  Cerebellar: No ataxia with FNF bilaterally. No tremor noted.   Gait: Deferred   Lab Results: Basic Metabolic Panel: Recent Labs  Lab 07/18/20 1136  NA 138  K 4.1  CL 102  CO2 26  GLUCOSE 124*  BUN 20  CREATININE 0.79  CALCIUM 9.1    CBC: Recent Labs  Lab 07/18/20 1136  WBC 7.0  NEUTROABS 5.5  HGB 11.8*  HCT 36.3*  MCV 93.1  PLT 165    Cardiac Enzymes: No results for input(s): CKTOTAL, CKMB, CKMBINDEX, TROPONINI in the last 168 hours.  Lipid Panel: No results for input(s): CHOL, TRIG, HDL, CHOLHDL, VLDL, LDLCALC in the last 168 hours.  Imaging: CT HEAD WO CONTRAST  Result Date: 07/18/2020 CLINICAL DATA:  Headache. Intracranial hemorrhage suspected. Fall last week, with slurred speech and difficulty speaking since fall. EXAM: CT HEAD WITHOUT CONTRAST TECHNIQUE: Contiguous axial images were obtained from the base of the skull through the vertex without intravenous contrast. COMPARISON:  Head CT dated 07/10/2020 FINDINGS: Brain: Ventricles are stable in size and configuration. Mild chronic small vessel ischemic changes noted within the bilateral periventricular white matter regions. Slight decrease in size of the subdural hemorrhage overlying the LEFT frontoparietal lobe compared to head CT of 07/10/2020, and also decreased in density per expected aging (now subacute in appearance). There is now a thin amount of acute-appearing subdural hemorrhage overlying the LEFT tentorium, likely extension from the aforementioned hemorrhage overlying the LEFT frontoparietal lobe. No evidence of additional  acute intracranial hemorrhage. No midline shift or herniation. Vascular: Chronic calcified atherosclerotic changes of the large vessels at the skull base. Focal heavy atherosclerotic calcification of the LEFT upper vertebral artery, versus stent. No unexpected hyperdense vessel. Skull: Normal. Negative for fracture or focal lesion. Sinuses/Orbits: No acute finding. Other: Fairly large amount of cerumen within the external auditory canal regions bilaterally. IMPRESSION: 1. Slight decrease in size of the subdural hemorrhage overlying the LEFT frontoparietal lobe compared to head CT of 07/10/2020, and also decreased in density per expected aging (now subacute in appearance). Mild mass effect with sulcal effacement in  the LEFT frontoparietal lobe, but no midline shift or herniation. 2. There is now a thin amount of acute-appearing subdural hemorrhage seen overlying the LEFT tentorium, likely interval extension from the aforementioned hemorrhage overlying the LEFT frontoparietal lobe. 3. No evidence of additional acute intracranial hemorrhage. 4. Mild chronic small vessel ischemic changes within the white matter. Electronically Signed   By: Franki Cabot M.D.   On: 07/18/2020 12:01    Assessment: 80 year old male with enlarging subdural hematoma and worsened confusion 1. Exam reveals hypophonic and severely dysarthric speech, but no aphasia or confusion. He does have mildly increased tone x 4 as well as hypomimia, consistent with his known Parkinson's disease. 2. CT head: Slight decrease in size of the subdural hemorrhage overlying the LEFT frontoparietal lobe compared to head CT of 07/10/2020, and also decreased in density per expected aging (now subacute in appearance). Mild mass effect with sulcal effacement in the LEFT frontoparietal lobe, but no midline shift or herniation. There is now a thin amount of acute-appearing subdural hemorrhage seen overlying the LEFT tentorium, likely interval extension from the  aforementioned hemorrhage overlying the LEFT frontoparietal lobe. No evidence of additional acute intracranial hemorrhage. Mild chronic small vessel ischemic changes within the white matter.  Recommendations: 1. EEG in AM.  2. PT/OT and Speech therapy.  3. Continue Keppra 4. Continue Sinemet  Electronically signed: Dr. Kerney Elbe 07/18/2020, 9:31 PM

## 2020-07-18 NOTE — ED Notes (Signed)
Pt family member upset because EMS told her it was best for him to come here and now he is out waiting and don't have his home meds. She stated that she thought that he was going to get a room once he got here.

## 2020-07-18 NOTE — ED Notes (Signed)
NS contacted EEG to call Dr Eulis Foster.

## 2020-07-19 ENCOUNTER — Telehealth: Payer: Self-pay | Admitting: Neurology

## 2020-07-19 ENCOUNTER — Observation Stay (HOSPITAL_COMMUNITY): Payer: HMO

## 2020-07-19 ENCOUNTER — Ambulatory Visit: Payer: HMO | Admitting: Neurology

## 2020-07-19 DIAGNOSIS — Z8249 Family history of ischemic heart disease and other diseases of the circulatory system: Secondary | ICD-10-CM | POA: Diagnosis not present

## 2020-07-19 DIAGNOSIS — G9349 Other encephalopathy: Secondary | ICD-10-CM | POA: Diagnosis present

## 2020-07-19 DIAGNOSIS — R410 Disorientation, unspecified: Secondary | ICD-10-CM | POA: Diagnosis present

## 2020-07-19 DIAGNOSIS — G20 Parkinson's disease: Secondary | ICD-10-CM | POA: Diagnosis present

## 2020-07-19 DIAGNOSIS — S065X9A Traumatic subdural hemorrhage with loss of consciousness of unspecified duration, initial encounter: Secondary | ICD-10-CM | POA: Diagnosis present

## 2020-07-19 DIAGNOSIS — Z20822 Contact with and (suspected) exposure to covid-19: Secondary | ICD-10-CM | POA: Diagnosis present

## 2020-07-19 DIAGNOSIS — I951 Orthostatic hypotension: Secondary | ICD-10-CM | POA: Diagnosis present

## 2020-07-19 DIAGNOSIS — R350 Frequency of micturition: Secondary | ICD-10-CM | POA: Diagnosis present

## 2020-07-19 DIAGNOSIS — Z888 Allergy status to other drugs, medicaments and biological substances status: Secondary | ICD-10-CM | POA: Diagnosis not present

## 2020-07-19 DIAGNOSIS — I10 Essential (primary) hypertension: Secondary | ICD-10-CM | POA: Diagnosis present

## 2020-07-19 DIAGNOSIS — R471 Dysarthria and anarthria: Secondary | ICD-10-CM | POA: Diagnosis present

## 2020-07-19 DIAGNOSIS — E119 Type 2 diabetes mellitus without complications: Secondary | ICD-10-CM | POA: Diagnosis present

## 2020-07-19 DIAGNOSIS — Z79890 Hormone replacement therapy (postmenopausal): Secondary | ICD-10-CM | POA: Diagnosis not present

## 2020-07-19 DIAGNOSIS — Z781 Physical restraint status: Secondary | ICD-10-CM | POA: Diagnosis not present

## 2020-07-19 DIAGNOSIS — Z8673 Personal history of transient ischemic attack (TIA), and cerebral infarction without residual deficits: Secondary | ICD-10-CM | POA: Diagnosis not present

## 2020-07-19 DIAGNOSIS — E785 Hyperlipidemia, unspecified: Secondary | ICD-10-CM | POA: Diagnosis present

## 2020-07-19 DIAGNOSIS — I629 Nontraumatic intracranial hemorrhage, unspecified: Secondary | ICD-10-CM | POA: Diagnosis not present

## 2020-07-19 DIAGNOSIS — I619 Nontraumatic intracerebral hemorrhage, unspecified: Secondary | ICD-10-CM | POA: Diagnosis present

## 2020-07-19 DIAGNOSIS — R29810 Facial weakness: Secondary | ICD-10-CM | POA: Diagnosis present

## 2020-07-19 DIAGNOSIS — N401 Enlarged prostate with lower urinary tract symptoms: Secondary | ICD-10-CM | POA: Diagnosis present

## 2020-07-19 DIAGNOSIS — Z833 Family history of diabetes mellitus: Secondary | ICD-10-CM | POA: Diagnosis not present

## 2020-07-19 DIAGNOSIS — Z7984 Long term (current) use of oral hypoglycemic drugs: Secondary | ICD-10-CM | POA: Diagnosis not present

## 2020-07-19 DIAGNOSIS — E039 Hypothyroidism, unspecified: Secondary | ICD-10-CM | POA: Diagnosis present

## 2020-07-19 DIAGNOSIS — K219 Gastro-esophageal reflux disease without esophagitis: Secondary | ICD-10-CM | POA: Diagnosis present

## 2020-07-19 DIAGNOSIS — Z79899 Other long term (current) drug therapy: Secondary | ICD-10-CM | POA: Diagnosis not present

## 2020-07-19 LAB — COMPREHENSIVE METABOLIC PANEL
ALT: 6 U/L (ref 0–44)
AST: 15 U/L (ref 15–41)
Albumin: 3.7 g/dL (ref 3.5–5.0)
Alkaline Phosphatase: 65 U/L (ref 38–126)
Anion gap: 13 (ref 5–15)
BUN: 18 mg/dL (ref 8–23)
CO2: 24 mmol/L (ref 22–32)
Calcium: 9.2 mg/dL (ref 8.9–10.3)
Chloride: 103 mmol/L (ref 98–111)
Creatinine, Ser: 0.74 mg/dL (ref 0.61–1.24)
GFR, Estimated: >60 mL/min (ref >60)
Glucose, Bld: 100 mg/dL — ABNORMAL HIGH (ref 70–99)
Potassium: 3.8 mmol/L (ref 3.5–5.1)
Sodium: 140 mmol/L (ref 135–145)
Total Bilirubin: 1.3 mg/dL — ABNORMAL HIGH (ref 0.3–1.2)
Total Protein: 6.7 g/dL (ref 6.5–8.1)

## 2020-07-19 LAB — CBC
HCT: 35.5 % — ABNORMAL LOW (ref 39.0–52.0)
Hemoglobin: 12 g/dL — ABNORMAL LOW (ref 13.0–17.0)
MCH: 31.4 pg (ref 26.0–34.0)
MCHC: 33.8 g/dL (ref 30.0–36.0)
MCV: 92.9 fL (ref 80.0–100.0)
Platelets: 185 10*3/uL (ref 150–400)
RBC: 3.82 MIL/uL — ABNORMAL LOW (ref 4.22–5.81)
RDW: 13.5 % (ref 11.5–15.5)
WBC: 10.2 10*3/uL (ref 4.0–10.5)
nRBC: 0 % (ref 0.0–0.2)

## 2020-07-19 MED ORDER — LACTATED RINGERS IV SOLN: INTRAVENOUS | Status: DC

## 2020-07-19 MED ORDER — ZIPRASIDONE MESYLATE 20 MG IM SOLR
10.0000 mg | Freq: Once | INTRAMUSCULAR | Status: AC
  Administered 2020-07-19: 10 mg via INTRAMUSCULAR

## 2020-07-19 NOTE — Telephone Encounter (Signed)
Patient's wife called and requested home health care for the patient for when he gets out of the hospital.

## 2020-07-19 NOTE — Telephone Encounter (Signed)
Spoke with Dr Tat who states since the patient is currently admitted then this can be addressed at the hospital.    Called patient and was hung up on will try to call again.   Called patient back and no answer. Left message for patients wife to contact office.

## 2020-07-19 NOTE — ED Notes (Signed)
This Rn to bedside as patient is hitting the ED tech and blood dripping down his hand from hitting the door. Patient has pulled off the condom cath. Patient saying "Fuck You" when asked what was wrong. Patient also with very dysarthric speech saying he wants to go home. Patient refusing breakfast and anything to drink as well as meds  when offered in attempt to distract him. Patient went to hit the MD passing by who came in to help and threw the MD's paper out of his hand and onto the floor. One Rn on each arm helping a very shaky patient to stay seated on stretcher. Patient assisted to a reclining position after medication administration to the left deltoid per MD order. Patient continues to grab RN hands and try to bend our fingers backwards as well as dig in his nails. Patient placed in upper soft restraints and MD alerted to come to bedside to assess.

## 2020-07-19 NOTE — Progress Notes (Signed)
Pt has been admitted to the unit. Wife is at bedside. All belongings are present. Telephone and call light are within reach.  07/19/20 1359  Vitals  Temp 97.8 F (36.6 C)  Temp Source Oral  BP (!) 153/75  MAP (mmHg) 99  BP Location Right Arm  BP Method Automatic  Patient Position (if appropriate) Lying  Pulse Rate 87  Pulse Rate Source Monitor  Resp 18  Level of Consciousness  Level of Consciousness Alert  MEWS COLOR  MEWS Score Color Green  Oxygen Therapy  SpO2 97 %  O2 Device Room Air  Pain Assessment  Pain Scale 0-10  Pain Score 0  PCA/Epidural/Spinal Assessment  Respiratory Pattern Regular;Unlabored  ECG Monitoring  Tele Box Verification Completed by Second Verifier Completed  Glasgow Coma Scale  Eye Opening 4  Best Verbal Response (NON-intubated) 4  Best Motor Response 6  Glasgow Coma Scale Score 14  MEWS Score  MEWS Temp 0  MEWS Systolic 0  MEWS Pulse 0  MEWS RR 0  MEWS LOC 0  MEWS Score 0

## 2020-07-19 NOTE — Procedures (Signed)
Patient Name: Justin Gomez  MRN: [id number]  Epilepsy Attending: Lora Havens  Referring Physician/Provider:  Date: 07/19/2020 Duration: 23.45 mins  Patient history: 80 year old male with enlarging subdural hematoma and worsened confusion. EEG to evaluate for seizure  Level of alertness: Awake  AEDs during EEG study: LEV  Technical aspects: This EEG study was done with scalp electrodes positioned according to the 10-20 International system of electrode placement. Electrical activity was acquired at a sampling rate of 500Hz  and reviewed with a high frequency filter of 70Hz  and a low frequency filter of 1Hz . EEG data were recorded continuously and digitally stored.   Description: No posterior dominant rhythm was seen. EEG showed continuous generalized and maximal left tempora region5-7hz  theta as well as 2-3 hz delta slowing.  Hyperventilation and photic stimulation were not performed.     ABNORMALITY -Continuous slow, generalized  IMPRESSION: This study is suggestive of cortical dysfunction in left temporal region likely secondary to underlying bleed. There is also moderate diffuse encephalopathy, nonspecific etiology. No seizures or epileptiform discharges were seen throughout the recording.  Araya Roel Barbra Sarks

## 2020-07-19 NOTE — ED Notes (Signed)
Breakfast ordered 

## 2020-07-19 NOTE — Telephone Encounter (Signed)
Noted and Dr Tat made aware.

## 2020-07-19 NOTE — ED Notes (Signed)
Pt assisted to recliner. 

## 2020-07-19 NOTE — ED Notes (Signed)
Pt assisted to recliner for breakfast.

## 2020-07-19 NOTE — ED Notes (Signed)
PT combative with tech while EEG was placed. Tech Harley sustained superficial nail wounds on right arm from trying to help patient remain still while EEG tech placed electrodes. Restraints reapplied. Spouse at bedside.

## 2020-07-19 NOTE — ED Notes (Addendum)
Pt becoming combative with staff. Phyisically hitting stafff when RN and tech attempt to redirect pt. Pt asked if they needed to be reposition, transfer to chair, and whether they would like breakfast.  MD to be paged.

## 2020-07-19 NOTE — Plan of Care (Signed)
Neurology plan of care.   EEG: This study is suggestive of cortical dysfunction in left temporal region likely secondary to underlying bleed. There is also moderate diffuse encephalopathy, nonspecific etiology. No seizures or epileptiform discharges were seen throughout the recording.  Neurology will be available for questions.   No charge note.   Clance Boll, NP. Plan discussed with Dr. Theda Sers.

## 2020-07-19 NOTE — ED Notes (Signed)
Attempted report x1. 

## 2020-07-19 NOTE — ED Notes (Signed)
Pt assisted to a recliner due to being uncomfortable in bed. Pt was able to stand up with 1 person assist and was able to slowly shuffle over to recliner and sit on his own. Pt given warm blankets and call bell. Pt has no complaints at this time.

## 2020-07-19 NOTE — ED Notes (Signed)
Pt assisted back to stretcher. Linens changed.

## 2020-07-19 NOTE — Progress Notes (Signed)
PROGRESS NOTE    Justin Gomez  NUU:[id number] DOB: December 17, 1940 DOA: 07/18/2020 PCP: Jani Gravel, MD   Brief Narrative:  Justin Gomez is a 80 y.o. male with medical history significant of CVA, hypertension, GERD, Parkinson's disease, postural hypotension,'s diabetes, hypothyroidism, recent admission for subdural hematoma who presents with some intermittent confusion and difficulty speaking for the past 2 days.   Patient was recently admitted from 1/21-1/22 after he had a fall with note subdural hematoma and associated slurred speech - repeat imaging negative and subsequently discharged home on keppra due to questionable seizure like activity. About 48h after starting keppra/returning home patient had increased intermittent confusion and difficulty speaking. In the ED: CT head showed a slight decrease of subdural hematoma at the left frontal parietal region which as above is decreased from 1/22.  There is a new thin acute subdural hematoma at the left tentorium which is consistent with an extension of the previous bleed.  No new/acute bleed suspected.  Neurosurgery consulted by EDP who states that due to this being an extension and not significant in size there is no indication for operative intervention, some irritation may be causing his waxing and waning symptoms.  Neurology was consulted as well, [facility] requested to admit.  Assessment & Plan:   Principal Problem:   Intracranial bleeding (HCC) Active Problems:   Essential hypertension   Type 2 diabetes mellitus without complication, without long-term current use of insulin (HCC)   Gastroesophageal reflux disease   Postural hypotension   Cerebral thrombosis with cerebral infarction   Parkinson's disease   SDH (subdural hematoma) (HCC)  Intracranial bleeding with recent subdural hematoma Acute onset delirium/behavioral changes/encephalopathy Rule out atypical seizure/todds paralysis Rule out polypharmacy - At baseline per wife prior to  fall and brain bleed- no speech deficits; standup/walked without assistance; minimal help with dressing; AOx4 with 'sharp mind' but occasionally became confused or misplaced items frequently - Unfortunately early this morning patient became markedly combative, required Geodon and restraints to maintain safety for the patient and staff. - Wife now at bedside states he has never done this before - concerned new Keppra could be causing confusion which isn't unreasonable - Will attempt medication holiday (patient unsafe to take PO today regardless) - Neurosurgery consulted in ED who states this is a nonoperative case due to minimal bleeding and expected extension on CT head - Neurology following, EEG pending- Appreciate neurology's assistance with this patient - Continue restraints, 1:1 sitter, and if necessary (but only as a last resort) sedating medications   Chronic Parkinson's disease - Continue home Sinemet once patient able to take p.o. appropriately  Urinary frequency and incotninence - Continue Myrbetriq and finasteride once able to properly take p.o. safely - Place external urinary catheter  Diabetes - Continue Metformin once able to take p.o. safely - CBG monitoring  Hypertension - Continue home losartan once able to take p.o. safely -Continue as needed medications in the meantime IV  History of CVA - Continue home atorvastatin once able to take p.o. safely  GERD - Continue home PPI  Hypothyroidism - Continue home Synthroid once able to take p.o. safely  DVT prophylaxis: SCDs Code Status: Full Family Communication: Wife at bedside this afternoon, spoke with her at length about patient's prognosis, current situation disposition and treatment.  At this time we discussed multiple possible causes for patient's acute confusion  Status is: Inpatient  Dispo: The patient is from: Home  Anticipated d/c is to: To be determined              Anticipated d/c date is:  48 to 72 hours              Patient currently not medically stable for discharge  Consultants:   Neurology, neurosurgery  Procedures:   None indicated  Antimicrobials:  None indicated  Subjective: Acutely this morning patient became markedly combative requiring restraints and Geodon, speech somewhat garbled but appears to have appropriate strength in all 4 limbs without deficits.  Objective: Vitals:   07/19/20 0300 07/19/20 0400 07/19/20 0500 07/19/20 0800  BP: 122/69 (!) 160/84 (!) 163/70 (!) 116/54  Pulse: 84 88 (!) 109 81  Resp: 14 15 13 15   Temp:      TempSrc:      SpO2: 97% 98% 96% 95%    Intake/Output Summary (Last 24 hours) at 07/19/2020 [id number] Last data filed at 07/19/2020 0139 Gross per 24 hour  Intake --  Output 700 ml  Net -700 ml   There were no vitals filed for this visit.  Examination:  General:  Pleasantly resting in bed in bilateral wrist restraints, somnolent no acute distress at this time. HEENT:  Normocephalic atraumatic.  Sclerae nonicteric, noninjected.  Extraocular movements intact bilaterally. Neck:  Without mass or deformity.  Trachea is midline. Lungs:  Clear to auscultate bilaterally without rhonchi, wheeze, or rales. Heart:  Regular rate and rhythm.  Without murmurs, rubs, or gallops. Abdomen:  Soft, nontender, nondistended.  Without guarding or rebound. Extremities: Without cyanosis, clubbing, edema, or obvious deformity.  Moving all limbs attempting to push myself and staff away with appropriate strength Vascular:  Dorsalis pedis and posterior tibial pulses palpable bilaterally. Skin:  Warm and dry, no erythema, no ulcerations.  Data Reviewed: I have personally reviewed following labs and imaging studies  CBC: Recent Labs  Lab 07/18/20 1136 07/19/20 0333  WBC 7.0 10.2  NEUTROABS 5.5  --   HGB 11.8* 12.0*  HCT 36.3* 35.5*  MCV 93.1 92.9  PLT 165 [id number]   Basic Metabolic Panel: Recent Labs  Lab 07/18/20 1136 07/19/20 0333  NA 138  140  K 4.1 3.8  CL 102 103  CO2 26 24  GLUCOSE 124* 100*  BUN 20 18  CREATININE 0.79 0.74  CALCIUM 9.1 9.2   GFR: Estimated Creatinine Clearance: 76.6 mL/min (by C-G formula based on SCr of 0.74 mg/dL). Liver Function Tests: Recent Labs  Lab 07/18/20 1136 07/19/20 0333  AST 13* 15  ALT 5 6  ALKPHOS 67 65  BILITOT 1.1 1.3*  PROT 6.3* 6.7  ALBUMIN 3.6 3.7   No results for input(s): LIPASE, AMYLASE in the last 168 hours. No results for input(s): AMMONIA in the last 168 hours. Coagulation Profile: Recent Labs  Lab 07/18/20 1136  INR 1.2   Cardiac Enzymes: No results for input(s): CKTOTAL, CKMB, CKMBINDEX, TROPONINI in the last 168 hours. BNP (last 3 results) No results for input(s): PROBNP in the last 8760 hours. HbA1C: No results for input(s): HGBA1C in the last 72 hours. CBG: No results for input(s): GLUCAP in the last 168 hours. Lipid Profile: No results for input(s): CHOL, HDL, LDLCALC, TRIG, CHOLHDL, LDLDIRECT in the last 72 hours. Thyroid Function Tests: No results for input(s): TSH, T4TOTAL, FREET4, T3FREE, THYROIDAB in the last 72 hours. Anemia Panel: No results for input(s): VITAMINB12, FOLATE, FERRITIN, TIBC, IRON, RETICCTPCT in the last 72 hours. Sepsis Labs: No results for input(s): PROCALCITON, LATICACIDVEN in the  last 168 hours.  Recent Results (from the past 240 hour(s))  SARS Coronavirus 2 by RT PCR (hospital order, performed in [facility] hospital lab) Nasopharyngeal Nasopharyngeal Swab     Status: None   Collection Time: 07/09/20  5:09 PM   Specimen: Nasopharyngeal Swab  Result Value Ref Range Status   SARS Coronavirus 2 NEGATIVE NEGATIVE Final    Comment: (NOTE) SARS-CoV-2 target nucleic acids are NOT DETECTED.  The SARS-CoV-2 RNA is generally detectable in upper and lower respiratory specimens during the acute phase of infection. The lowest concentration of SARS-CoV-2 viral copies this assay can detect is 250 copies / mL. A negative result  does not preclude SARS-CoV-2 infection and should not be used as the sole basis for treatment or other patient management decisions.  A negative result may occur with improper specimen collection / handling, submission of specimen other than nasopharyngeal swab, presence of viral mutation(s) within the areas targeted by this assay, and inadequate number of viral copies (<250 copies / mL). A negative result must be combined with clinical observations, patient history, and epidemiological information.  Fact Sheet for Patients:   StrictlyIdeas.no  Fact Sheet for Healthcare Providers: BankingDealers.co.za  This test is not yet approved or  cleared by the [place] FDA and has been authorized for detection and/or diagnosis of SARS-CoV-2 by FDA under an Emergency Use Authorization (EUA).  This EUA will remain in effect (meaning this test can be used) for the duration of the COVID-19 declaration under Section 564(b)(1) of the Act, 21 U.S.C. section 360bbb-3(b)(1), unless the authorization is terminated or revoked sooner.  Performed at [facility], [place] [address] [place], [place] [address]   SARS CORONAVIRUS 2 (TAT 6-24 HRS) Nasopharyngeal Nasopharyngeal Swab     Status: None   Collection Time: 07/18/20  4:53 PM   Specimen: Nasopharyngeal Swab  Result Value Ref Range Status   SARS Coronavirus 2 NEGATIVE NEGATIVE Final    Comment: (NOTE) SARS-CoV-2 target nucleic acids are NOT DETECTED.  The SARS-CoV-2 RNA is generally detectable in upper and lower respiratory specimens during the acute phase of infection. Negative results do not preclude SARS-CoV-2 infection, do not rule out co-infections with other pathogens, and should not be used as the sole basis for treatment or other patient management decisions. Negative results must be combined with clinical observations, patient history, and epidemiological information.  The expected result is Negative.  Fact Sheet for Patients: SugarRoll.be  Fact Sheet for Healthcare Providers: https://www.woods-mathews.com/  This test is not yet approved or cleared by the [place] FDA and  has been authorized for detection and/or diagnosis of SARS-CoV-2 by FDA under an Emergency Use Authorization (EUA). This EUA will remain  in effect (meaning this test can be used) for the duration of the COVID-19 declaration under Se ction 564(b)(1) of the Act, 21 U.S.C. section 360bbb-3(b)(1), unless the authorization is terminated or revoked sooner.  Performed at [facility], [place] [address], [place], [place] [address]          Radiology Studies: CT HEAD WO CONTRAST  Result Date: 07/18/2020 CLINICAL DATA:  Headache. Intracranial hemorrhage suspected. Fall last week, with slurred speech and difficulty speaking since fall. EXAM: CT HEAD WITHOUT CONTRAST TECHNIQUE: Contiguous axial images were obtained from the base of the skull through the vertex without intravenous contrast. COMPARISON:  Head CT dated 07/10/2020 FINDINGS: Brain: Ventricles are stable in size and configuration. Mild chronic small vessel ischemic changes noted within the bilateral periventricular white matter  regions. Slight decrease in size of the subdural hemorrhage overlying the LEFT frontoparietal lobe compared to head CT of 07/10/2020, and also decreased in density per expected aging (now subacute in appearance). There is now a thin amount of acute-appearing subdural hemorrhage overlying the LEFT tentorium, likely extension from the aforementioned hemorrhage overlying the LEFT frontoparietal lobe. No evidence of additional acute intracranial hemorrhage. No midline shift or herniation. Vascular: Chronic calcified atherosclerotic changes of the large vessels at the skull base. Focal heavy atherosclerotic calcification of the LEFT upper vertebral artery, versus  stent. No unexpected hyperdense vessel. Skull: Normal. Negative for fracture or focal lesion. Sinuses/Orbits: No acute finding. Other: Fairly large amount of cerumen within the external auditory canal regions bilaterally. IMPRESSION: 1. Slight decrease in size of the subdural hemorrhage overlying the LEFT frontoparietal lobe compared to head CT of 07/10/2020, and also decreased in density per expected aging (now subacute in appearance). Mild mass effect with sulcal effacement in the LEFT frontoparietal lobe, but no midline shift or herniation. 2. There is now a thin amount of acute-appearing subdural hemorrhage seen overlying the LEFT tentorium, likely interval extension from the aforementioned hemorrhage overlying the LEFT frontoparietal lobe. 3. No evidence of additional acute intracranial hemorrhage. 4. Mild chronic small vessel ischemic changes within the white matter. Electronically Signed   By: Franki Cabot M.D.   On: 07/18/2020 12:01   Scheduled Meds: . atorvastatin  20 mg Oral q1800  . carbidopa-levodopa  2 tablet Oral TID  . finasteride  5 mg Oral Daily  . levETIRAcetam  500 mg Oral BID  . levothyroxine  75 mcg Oral Daily  . losartan  25 mg Oral Daily  . metFORMIN  750 mg Oral BID  . mirabegron ER  50 mg Oral Daily  . pantoprazole  40 mg Oral Daily  . sodium chloride flush  3 mL Intravenous Q12H   Continuous Infusions:   LOS: 0 days   Time spent: 40 minutes  Little Ishikawa, DO [facility]  If 7PM-7AM, please contact night-coverage [email or web address]  07/19/2020, 8:11 AM

## 2020-07-19 NOTE — Progress Notes (Signed)
EEG complete - results pending 

## 2020-07-20 ENCOUNTER — Ambulatory Visit: Payer: HMO | Admitting: Physical Therapy

## 2020-07-20 ENCOUNTER — Encounter: Payer: HMO | Admitting: Occupational Therapy

## 2020-07-20 DIAGNOSIS — I10 Essential (primary) hypertension: Secondary | ICD-10-CM | POA: Diagnosis not present

## 2020-07-20 LAB — COMPREHENSIVE METABOLIC PANEL
ALT: 18 U/L (ref 0–44)
AST: 15 U/L (ref 15–41)
Albumin: 3.4 g/dL — ABNORMAL LOW (ref 3.5–5.0)
Alkaline Phosphatase: 65 U/L (ref 38–126)
Anion gap: 12 (ref 5–15)
BUN: 16 mg/dL (ref 8–23)
CO2: 24 mmol/L (ref 22–32)
Calcium: 9 mg/dL (ref 8.9–10.3)
Chloride: 104 mmol/L (ref 98–111)
Creatinine, Ser: 0.84 mg/dL (ref 0.61–1.24)
GFR, Estimated: >60 mL/min (ref >60)
Glucose, Bld: 88 mg/dL (ref 70–99)
Potassium: 3.8 mmol/L (ref 3.5–5.1)
Sodium: 140 mmol/L (ref 135–145)
Total Bilirubin: 1.6 mg/dL — ABNORMAL HIGH (ref 0.3–1.2)
Total Protein: 6.1 g/dL — ABNORMAL LOW (ref 6.5–8.1)

## 2020-07-20 LAB — CBC
HCT: 34.2 % — ABNORMAL LOW (ref 39.0–52.0)
Hemoglobin: 11.3 g/dL — ABNORMAL LOW (ref 13.0–17.0)
MCH: 30.1 pg (ref 26.0–34.0)
MCHC: 33 g/dL (ref 30.0–36.0)
MCV: 91 fL (ref 80.0–100.0)
Platelets: 190 10*3/uL (ref 150–400)
RBC: 3.76 MIL/uL — ABNORMAL LOW (ref 4.22–5.81)
RDW: 13.2 % (ref 11.5–15.5)
WBC: 7.9 10*3/uL (ref 4.0–10.5)
nRBC: 0 % (ref 0.0–0.2)

## 2020-07-20 NOTE — Evaluation (Addendum)
Physical Therapy Evaluation Patient Details Name: Justin Gomez MRN: [id number] DOB: Apr 30, 1941 Today's Date: 07/20/2020   History of Present Illness  Justin Gomez is a 80 y.o. male with medical history significant of CVA, hypertension, GERD, Parkinson's disease, postural hypotension,'s diabetes, hypothyroidism, recent admission 1-21-1/22 for subdural hematoma s/p fall who presents with some intermittent confusion and difficulty speaking for the past 2 days. Pt found to have new thin acute subdural hematoma at L tentorium.    Clinical Impression  Pt was able to ambulate without AD and climb a flight of stairs PTA however pt now requiring use of RW for safe ambulation and at this time would require assist for stair negotiation in which wife can not provide. Spouse reports she can not physically provide assistance as she has a knee injury. Recommend OT eval for assessment of ADL capability. Pt and spouse report they are looking for a new place to live that doesn't have stairs and their daughter is looking at a place on Friday. Acute Pt to cont to follow.    Follow Up Recommendations SNF;Supervision/Assistance - 24 hour    Equipment Recommendations  None recommended by PT    Recommendations for Other Services       Precautions / Restrictions Precautions Precautions: Fall Precaution Comments: seizure precautions Restrictions Weight Bearing Restrictions: No      Mobility  Bed Mobility Overal bed mobility: Needs Assistance Bed Mobility: Supine to Sit     Supine to sit: Min assist     General bed mobility comments: HOB all the way up, increased time, minA for LE management off EOB, minA for trunk elevation    Transfers Overall transfer level: Needs assistance Equipment used: Rolling walker (2 wheeled) Transfers: Sit to/from Stand Sit to Stand: Min assist         General transfer comment: max verbal cues for safe hand placement as pt trying to pull up on walker and  falling backwards, minA to power up and steady during transition of hands  Ambulation/Gait Ambulation/Gait assistance: Min assist Gait Distance (Feet): 120 Feet Assistive device: Rolling walker (2 wheeled) Gait Pattern/deviations: Step-through pattern;Decreased stride length;Shuffle (parkinsonian type pattern) Gait velocity: impulsively fast Gait velocity interpretation: 1.31 - 2.62 ft/sec, indicative of limited community ambulator General Gait Details: pt with short, shuffled fast steps, verbal cues to stay in walker, minA for walker management around obstacles  Stairs            Wheelchair Mobility    Modified Rankin (Stroke Patients Only) Modified Rankin (Stroke Patients Only) Pre-Morbid Rankin Score: Moderate disability Modified Rankin: Moderately severe disability     Balance Overall balance assessment: Mild deficits observed, not formally tested Sitting-balance support: Feet supported;No upper extremity supported Sitting balance-Leahy Scale: Good       Standing balance-Leahy Scale: Fair Standing balance comment: bilat UE support on walker                             Pertinent Vitals/Pain Pain Assessment: No/denies pain    Home Living Family/patient expects to be discharged to:: Private residence Living Arrangements: Spouse/significant other Available Help at Discharge: Family;Available 24 hours/day (however spouse unable to physical assist) Type of Home: Apartment Home Access: Stairs to enter Entrance Stairs-Rails: Left Entrance Stairs-Number of Steps: 1 flight/10 steps Home Layout: One level Home Equipment: Walker - 2 wheels;Grab bars - toilet;Shower seat      Prior Function Level of Independence: Needs assistance  Gait / Transfers Assistance Needed: pt was ambulating without AD  ADL's / Homemaking Assistance Needed: wife reports patient didn't want help with ADLs however will not want to bath or change clothes due to being difficulty         Hand Dominance   Dominant Hand: Right    Extremity/Trunk Assessment   Upper Extremity Assessment Upper Extremity Assessment: Overall WFL for tasks assessed    Lower Extremity Assessment Lower Extremity Assessment: Overall WFL for tasks assessed (coordination deficits due to parkinsons)    Cervical / Trunk Assessment Cervical / Trunk Assessment: Kyphotic  Communication   Communication: Expressive difficulties  Cognition Arousal/Alertness: Awake/alert Behavior During Therapy: WFL for tasks assessed/performed Overall Cognitive Status: Within Functional Limits for tasks assessed                                        General Comments General comments (skin integrity, edema, etc.): VSS    Exercises     Assessment/Plan    PT Assessment Patient needs continued PT services  PT Problem List Decreased strength;Decreased activity tolerance;Decreased range of motion;Decreased balance;Decreased mobility;Decreased coordination;Decreased knowledge of use of DME;Decreased safety awareness;Decreased knowledge of precautions       PT Treatment Interventions DME instruction;Gait training;Stair training;Functional mobility training;Therapeutic activities;Therapeutic exercise;Balance training    PT Goals (Current goals can be found in the Care Plan section)  Acute Rehab PT Goals Patient Stated Goal: home PT Goal Formulation: With patient Time For Goal Achievement: 08-21-2020 Potential to Achieve Goals: Good    Frequency Min 3X/week   Barriers to discharge Decreased caregiver support;Inaccessible home environment pt with flight of stairs to enter home and spouse unable to provide physical assist    Co-evaluation               AM-PAC PT "6 Clicks" Mobility  Outcome Measure Help needed turning from your back to your side while in a flat bed without using bedrails?: A Little Help needed moving from lying on your back to sitting on the side of a flat bed  without using bedrails?: A Little Help needed moving to and from a bed to a chair (including a wheelchair)?: A Little Help needed standing up from a chair using your arms (e.g., wheelchair or bedside chair)?: A Little Help needed to walk in hospital room?: A Little Help needed climbing 3-5 steps with a railing? : A Lot 6 Click Score: 17    End of Session Equipment Utilized During Treatment: Gait belt Activity Tolerance: Patient tolerated treatment well Patient left: in bed;with call bell/phone within reach;with bed alarm set Nurse Communication: Mobility status PT Visit Diagnosis: Unsteadiness on feet (R26.81);Difficulty in walking, not elsewhere classified (R26.2)    Time: 1157-2620 PT Time Calculation (min) (ACUTE ONLY): 29 min   Charges:   PT Evaluation $PT Eval Moderate Complexity: 1 Mod PT Treatments $Gait Training: 8-22 mins        Kittie Plater, PT, DPT Acute Rehabilitation Services Pager #: [phone] Office #: [phone]   Berline Lopes 07/20/2020, 4:36 PM

## 2020-07-20 NOTE — Progress Notes (Signed)
PROGRESS NOTE    Justin Gomez  [id number] DOB: 1941/06/09 DOA: 07/18/2020 PCP: Jani Gravel, MD   Brief Narrative:  Justin Gomez is a 80 y.o. male with medical history significant of CVA, hypertension, GERD, Parkinson's disease, postural hypotension,'s diabetes, hypothyroidism, recent admission for subdural hematoma who presents with some intermittent confusion and difficulty speaking for the past 2 days. Patient was recently admitted from 1/21-1/22 after he had a fall with note subdural hematoma and associated slurred speech - repeat imaging negative and subsequently discharged home on keppra due to questionable seizure like activity. About 48h after starting keppra/returning home patient had increased intermittent confusion and difficulty speaking. In the ED: CT head showed a slight decrease of subdural hematoma at the left frontal parietal region which as above is decreased from 1/22.  There is a new thin acute subdural hematoma at the left tentorium which is consistent with an extension of the previous bleed.  No new/acute bleed suspected.  Neurosurgery consulted by EDP who states that due to this being an extension and not significant in size there is no indication for operative intervention, some irritation may be causing his waxing and waning symptoms.  Neurology was consulted as well, TRH requested to admit.  Assessment & Plan:   Principal Problem:   Intracranial bleeding (HCC) Active Problems:   Essential hypertension   Type 2 diabetes mellitus without complication, without long-term current use of insulin (HCC)   Gastroesophageal reflux disease   Postural hypotension   Cerebral thrombosis with cerebral infarction   Parkinson's disease   SDH (subdural hematoma) (HCC)   ICH (intracerebral hemorrhage) (HCC)   Intracranial bleeding with recent subdural hematoma Acute onset delirium/behavioral changes/encephalopathy, resolving Rule out atypical seizure/todds paralysis Rule  out polypharmacy - At baseline per wife prior to fall and brain bleed- no speech deficits; standup/walked without assistance; minimal help with dressing; AOx4 with 'sharp mind' but occasionally became confused or misplaced items frequently -Patient's mental status is improving drastically compared to yesterday, much more awake alert, cooperative.  Slurred speech still ongoing but patient able to communicate discernibly well. - Neurosurgery consulted in ED who states this is a nonoperative case due to minimal bleeding and expected extension on CT head - Neurology following, EEG shows diffuse encephalopathy without epileptiform discharges - Discontinue restraints, sitter now the patient is more awake alert and able to be oriented   Chronic Parkinson's disease - Continue home Sinemet  Urinary frequency and incotninence - Continue Myrbetriq and finasteride - Place external urinary catheter  Diabetes - Continue Metformin once able to take p.o. safely - CBG monitoring  Hypertension - Continue home losartan once able to take p.o. safely -Continue as needed medications in the meantime IV  History of CVA - Continue home atorvastatin once able to take p.o. safely  GERD - Continue home PPI  Hypothyroidism - Continue home Synthroid once able to take p.o. safely  DVT prophylaxis: SCDs Code Status: Full Family Communication: Wife updated -discussed patient's prognosis, current situation disposition and treatment.  At this time we discussed multiple possible causes for patient's acute confusion  Status is: Inpatient  Dispo: The patient is from: Home              Anticipated d/c is to: To be determined              Anticipated d/c date is: 48 to 72 hours              Patient currently not medically stable for  discharge  Consultants:   Neurology, neurosurgery  Procedures:   None indicated  Antimicrobials:  None indicated  Subjective: No acute issues or events overnight  patient much more awake alert oriented this morning, discontinue restraints, patient otherwise denies nausea vomiting diarrhea constipation headache fevers or chills.  Objective: Vitals:   07/19/20 1754 07/19/20 1940 07/19/20 2333 07/20/20 0411  BP: (!) 166/82 (!) 156/71 (!) 174/70 (!) 167/91  Pulse: 95 96 93 93  Resp: 20 17 18 17   Temp: 98.6 F (37 C) 98.3 F (36.8 C) 98.4 F (36.9 C) 98.7 F (37.1 C)  TempSrc: Oral Oral Oral Oral  SpO2: 99% 95% 96% 94%   No intake or output data in the 24 hours ending 07/20/20 0730 There were no vitals filed for this visit.  Examination:  General: Pleasantly resting in bed no acute distress oriented to person place and situation HEENT:  Normocephalic atraumatic.  Sclerae nonicteric, noninjected.  Extraocular movements intact bilaterally. Neck:  Without mass or deformity.  Trachea is midline. Lungs:  Clear to auscultate bilaterally without rhonchi, wheeze, or rales. Heart:  Regular rate and rhythm.  Without murmurs, rubs, or gallops. Abdomen:  Soft, nontender, nondistended.  Without guarding or rebound. Extremities: Without cyanosis, clubbing, edema, or obvious deformity.  Moving all limbs attempting to push myself and staff away with appropriate strength Vascular:  Dorsalis pedis and posterior tibial pulses palpable bilaterally. Skin:  Warm and dry, no erythema, no ulcerations.  Data Reviewed: I have personally reviewed following labs and imaging studies  CBC: Recent Labs  Lab 07/18/20 1136 07/19/20 0333 07/20/20 0335  WBC 7.0 10.2 7.9  NEUTROABS 5.5  --   --   HGB 11.8* 12.0* 11.3*  HCT 36.3* 35.5* 34.2*  MCV 93.1 92.9 91.0  PLT 165 185 [id number]   Basic Metabolic Panel: Recent Labs  Lab 07/18/20 1136 07/19/20 0333 07/20/20 0335  NA 138 140 140  K 4.1 3.8 3.8  CL 102 103 104  CO2 26 24 24   GLUCOSE 124* 100* 88  BUN 20 18 16   CREATININE 0.79 0.74 0.84  CALCIUM 9.1 9.2 9.0   GFR: Estimated Creatinine Clearance: 72.9 mL/min (by  C-G formula based on SCr of 0.84 mg/dL). Liver Function Tests: Recent Labs  Lab 07/18/20 1136 07/19/20 0333 07/20/20 0335  AST 13* 15 15  ALT 5 6 18   ALKPHOS 67 65 65  BILITOT 1.1 1.3* 1.6*  PROT 6.3* 6.7 6.1*  ALBUMIN 3.6 3.7 3.4*   No results for input(s): LIPASE, AMYLASE in the last 168 hours. No results for input(s): AMMONIA in the last 168 hours. Coagulation Profile: Recent Labs  Lab 07/18/20 1136  INR 1.2   Cardiac Enzymes: No results for input(s): CKTOTAL, CKMB, CKMBINDEX, TROPONINI in the last 168 hours. BNP (last 3 results) No results for input(s): PROBNP in the last 8760 hours. HbA1C: No results for input(s): HGBA1C in the last 72 hours. CBG: No results for input(s): GLUCAP in the last 168 hours. Lipid Profile: No results for input(s): CHOL, HDL, LDLCALC, TRIG, CHOLHDL, LDLDIRECT in the last 72 hours. Thyroid Function Tests: No results for input(s): TSH, T4TOTAL, FREET4, T3FREE, THYROIDAB in the last 72 hours. Anemia Panel: No results for input(s): VITAMINB12, FOLATE, FERRITIN, TIBC, IRON, RETICCTPCT in the last 72 hours. Sepsis Labs: No results for input(s): PROCALCITON, LATICACIDVEN in the last 168 hours.  Recent Results (from the past 240 hour(s))  SARS CORONAVIRUS 2 (TAT 6-24 HRS) Nasopharyngeal Nasopharyngeal Swab     Status: None  Collection Time: 07/18/20  4:53 PM   Specimen: Nasopharyngeal Swab  Result Value Ref Range Status   SARS Coronavirus 2 NEGATIVE NEGATIVE Final    Comment: (NOTE) SARS-CoV-2 target nucleic acids are NOT DETECTED.  The SARS-CoV-2 RNA is generally detectable in upper and lower respiratory specimens during the acute phase of infection. Negative results do not preclude SARS-CoV-2 infection, do not rule out co-infections with other pathogens, and should not be used as the sole basis for treatment or other patient management decisions. Negative results must be combined with clinical observations, patient history, and  epidemiological information. The expected result is Negative.  Fact Sheet for Patients: SugarRoll.be  Fact Sheet for Healthcare Providers: https://www.woods-mathews.com/  This test is not yet approved or cleared by the [place] FDA and  has been authorized for detection and/or diagnosis of SARS-CoV-2 by FDA under an Emergency Use Authorization (EUA). This EUA will remain  in effect (meaning this test can be used) for the duration of the COVID-19 declaration under Se ction 564(b)(1) of the Act, 21 U.S.C. section 360bbb-3(b)(1), unless the authorization is terminated or revoked sooner.  Performed at [facility], [place] [address], [place], [place] [address]     Radiology Studies: CT HEAD WO CONTRAST  Result Date: 07/18/2020 CLINICAL DATA:  Headache. Intracranial hemorrhage suspected. Fall last week, with slurred speech and difficulty speaking since fall. EXAM: CT HEAD WITHOUT CONTRAST TECHNIQUE: Contiguous axial images were obtained from the base of the skull through the vertex without intravenous contrast. COMPARISON:  Head CT dated 07/10/2020 FINDINGS: Brain: Ventricles are stable in size and configuration. Mild chronic small vessel ischemic changes noted within the bilateral periventricular white matter regions. Slight decrease in size of the subdural hemorrhage overlying the LEFT frontoparietal lobe compared to head CT of 07/10/2020, and also decreased in density per expected aging (now subacute in appearance). There is now a thin amount of acute-appearing subdural hemorrhage overlying the LEFT tentorium, likely extension from the aforementioned hemorrhage overlying the LEFT frontoparietal lobe. No evidence of additional acute intracranial hemorrhage. No midline shift or herniation. Vascular: Chronic calcified atherosclerotic changes of the large vessels at the skull base. Focal heavy atherosclerotic calcification of the LEFT upper vertebral  artery, versus stent. No unexpected hyperdense vessel. Skull: Normal. Negative for fracture or focal lesion. Sinuses/Orbits: No acute finding. Other: Fairly large amount of cerumen within the external auditory canal regions bilaterally. IMPRESSION: 1. Slight decrease in size of the subdural hemorrhage overlying the LEFT frontoparietal lobe compared to head CT of 07/10/2020, and also decreased in density per expected aging (now subacute in appearance). Mild mass effect with sulcal effacement in the LEFT frontoparietal lobe, but no midline shift or herniation. 2. There is now a thin amount of acute-appearing subdural hemorrhage seen overlying the LEFT tentorium, likely interval extension from the aforementioned hemorrhage overlying the LEFT frontoparietal lobe. 3. No evidence of additional acute intracranial hemorrhage. 4. Mild chronic small vessel ischemic changes within the white matter. Electronically Signed   By: Franki Cabot M.D.   On: 07/18/2020 12:01   EEG adult  Result Date: 07/19/2020 Lora Havens, MD     07/19/2020  2:22 PM Patient Name: Justin Gomez MRN: [id number] Epilepsy Attending: Lora Havens Referring Physician/Provider: Date: 07/19/2020 Duration: 23.45 mins Patient history: 80 year old male with enlarging subdural hematoma and worsened confusion. EEG to evaluate for seizure Level of alertness: Awake AEDs during EEG study: LEV Technical aspects: This EEG study was done with scalp electrodes positioned  according to the 10-20 International system of electrode placement. Electrical activity was acquired at a sampling rate of 500Hz  and reviewed with a high frequency filter of 70Hz  and a low frequency filter of 1Hz . EEG data were recorded continuously and digitally stored. Description: No posterior dominant rhythm was seen. EEG showed continuous generalized and maximal left tempora region5-7hz  theta as well as 2-3 hz delta slowing.  Hyperventilation and photic stimulation were not performed.    ABNORMALITY -Continuous slow, generalized IMPRESSION: This study is suggestive of cortical dysfunction in left temporal region likely secondary to underlying bleed. There is also moderate diffuse encephalopathy, nonspecific etiology. No seizures or epileptiform discharges were seen throughout the recording. Priyanka Barbra Sarks   Scheduled Meds: . atorvastatin  20 mg Oral q1800  . carbidopa-levodopa  2 tablet Oral TID  . finasteride  5 mg Oral Daily  . levETIRAcetam  500 mg Oral BID  . levothyroxine  75 mcg Oral Daily  . losartan  25 mg Oral Daily  . metFORMIN  750 mg Oral BID  . mirabegron ER  50 mg Oral Daily  . pantoprazole  40 mg Oral Daily  . sodium chloride flush  3 mL Intravenous Q12H   Continuous Infusions: . lactated ringers 50 mL/hr at 07/19/20 1811     LOS: 1 day   Time spent: 40 minutes  Little Ishikawa, DO [facility]  If 7PM-7AM, please contact night-coverage [email or web address]  07/20/2020, 7:30 AM

## 2020-07-20 NOTE — Evaluation (Signed)
Clinical/Bedside Swallow Evaluation Patient Details  Name: Justin Gomez MRN: [id number] Date of Birth: June 09, 1941  Today's Date: 07/20/2020 Time: SLP Start Time (ACUTE ONLY): 5188 SLP Stop Time (ACUTE ONLY): 1040 SLP Time Calculation (min) (ACUTE ONLY): 47 min  Past Medical History:  Past Medical History:  Diagnosis Date  . BPH (benign prostatic hypertrophy)   . 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 Diverticulosis   . Essential hypertension 11/29/2015  . Gastroesophageal reflux disease 04/13/2016  . Hiatal hernia   . Hyperlipidemia   . Hypothyroidism   . Lumbar herniated disc    L4  . Mild neurocognitive disorder, unclear etiology 09/24/2019  . Multiple system atrophy   . Parkinson's disease 07/31/2017  . Postural dizziness with near syncope 07/14/2017  . Postural hypotension 01/22/2017  . 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 Type 2 diabetes mellitus without complication, without long-term current use of insulin ([place]) 11/29/2015   Past Surgical History:  Past Surgical History:  Procedure Laterality Date  . APPENDECTOMY  1956  . KNEE SURGERY  2009   right  . SHOULDER SURGERY  2010   left   HPI:  80 y.o. male with medical history significant of CVA, hypertension, GERD, Parkinson's disease, postural hypotension,'s diabetes, hypothyroidism, recent admission for subdural hematoma who presents with some intermittent confusion and difficulty speaking for the past 2 days. Patient was recently admitted from 1/21-1/22 after he had a fall and hit his head and was noted to have a subdural hematoma and associated slurred speech.  A repeat CT the following day was stable and there was no indication for surgical intervention and his slurred speech was improving so he was discharged home.  He was discharged on Keppra due to a 15-second seizure-like episode in the ED during that admission.  He has been taking all prescribed medications including  Keppra at home. Repeat CT of head this admission showed There is now a thin amount of acute-appearing subdural hemorrhage seen overlying the LEFT tentorium, likely interval extension from the aforementioned hemorrhage overlying the LEFT frontoparietal lobe.   Assessment / Plan / Recommendation Clinical Impression  Pt with hx of dysphagia in setting of PD and CVA. This admission swallow function appears worsened (as compared to chart review and most recent MBSS 06/2019). Pt mentation greatly improving, aroused well, agreeable and able to follow simple commands. Provided diligent oral care as xerostomia and dried secrertions noted; improving greatly post oral care. Pt with baseline wet vocal quality (suspect secretions) clearing with cues for throat clear. Pt with overt s/sx of suspicion for reduced airway protection with ice chips, thin, nectar thick liquids, and solid PO including wet vocal quality, throat clear, and couging. Puree and honey thick liquids were without overt coughing. Trace wet vocal quality noted, however pt able to clear throat and volitionally swallow clearing voice. Recommend conservative dypshagia 1 (puree) and honey thick liuids with medicines crushed in puree. SLP to follow up.  SLP Visit Diagnosis: Dysphagia, unspecified (R13.10);Dysphagia, oropharyngeal phase (R13.12)    Aspiration Risk  Moderate aspiration risk    Diet Recommendation   Dysphagia 1 (puree), honey thick liquids   Medication Administration: Crushed with puree    Other  Recommendations Oral Care Recommendations: Oral care BID Other Recommendations: Order thickener from pharmacy   Follow up Recommendations 24 hour supervision/assistance      Frequency and Duration min 2x/week  2 weeks       Prognosis Prognosis for Safe Diet Advancement: Good  Barriers to Reach Goals: Motivation      Swallow Study   General Date of Onset: 07/18/20 HPI: 80 y.o. male with medical history significant of CVA,  hypertension, GERD, Parkinson's disease, postural hypotension,'s diabetes, hypothyroidism, recent admission for subdural hematoma who presents with some intermittent confusion and difficulty speaking for the past 2 days. Patient was recently admitted from 1/21-1/22 after he had a fall and hit his head and was noted to have a subdural hematoma and associated slurred speech.  A repeat CT the following day was stable and there was no indication for surgical intervention and his slurred speech was improving so he was discharged home.  He was discharged on Keppra due to a 15-second seizure-like episode in the ED during that admission.  He has been taking all prescribed medications including Keppra at home. Repeat CT of head this admission showed There is now a thin amount of acute-appearing subdural hemorrhage seen overlying the LEFT tentorium, likely interval extension from the aforementioned hemorrhage overlying the LEFT frontoparietal lobe. Type of Study: Bedside Swallow Evaluation Previous Swallow Assessment: MBSS 06/2019; mild pharyngeal dysphagia Diet Prior to this Study: NPO Temperature Spikes Noted: No Respiratory Status: Room air History of Recent Intubation: No Behavior/Cognition: Alert;Cooperative;Requires cueing Oral Cavity Assessment: Dry Oral Care Completed by SLP: Yes Oral Cavity - Dentition: Adequate natural dentition Vision: Functional for self-feeding Self-Feeding Abilities: Needs assist Patient Positioning: Upright in bed Baseline Vocal Quality: Low vocal intensity;Wet Volitional Cough: Weak Volitional Swallow: Able to elicit    Oral/Motor/Sensory Function Overall Oral Motor/Sensory Function: Generalized oral weakness   Ice Chips Ice chips: Impaired Presentation: Spoon Oral Phase Impairments: Reduced lingual movement/coordination;Impaired mastication Oral Phase Functional Implications: Prolonged oral transit Pharyngeal Phase Impairments: Suspected delayed Swallow;Decreased  hyoid-laryngeal movement;Multiple swallows;Wet Vocal Quality;Cough - Delayed   Thin Liquid Thin Liquid: Impaired Presentation: Cup Oral Phase Functional Implications: Prolonged oral transit Pharyngeal  Phase Impairments: Suspected delayed Swallow;Multiple swallows;Decreased hyoid-laryngeal movement;Wet Vocal Quality;Cough - Delayed;Throat Clearing - Delayed;Throat Clearing - Immediate    Nectar Thick Nectar Thick Liquid: Impaired Presentation: Cup Oral phase functional implications: Prolonged oral transit Pharyngeal Phase Impairments: Suspected delayed Swallow;Multiple swallows;Decreased hyoid-laryngeal movement;Wet Vocal Quality;Throat Clearing - Delayed;Throat Clearing - Immediate;Cough - Delayed   Honey Thick Honey Thick Liquid: Impaired Presentation: Cup Oral Phase Functional Implications: Prolonged oral transit Pharyngeal Phase Impairments: Suspected delayed Swallow;Multiple swallows;Decreased hyoid-laryngeal movement;Wet Vocal Quality   Puree Puree: Impaired Presentation: Self Fed Oral Phase Functional Implications: Prolonged oral transit Pharyngeal Phase Impairments: Suspected delayed Swallow;Multiple swallows;Decreased hyoid-laryngeal movement;Throat Clearing - Delayed   Solid     Solid: Impaired Presentation: Self Fed Oral Phase Impairments: Impaired mastication;Reduced lingual movement/coordination Oral Phase Functional Implications: Prolonged oral transit;Impaired mastication Pharyngeal Phase Impairments: Suspected delayed Swallow;Cough - Immediate;Multiple swallows;Decreased hyoid-laryngeal movement;Wet Vocal Quality      Donzell Coller H. MA, CCC-SLP Acute Rehabilitation Services   07/20/2020,10:48 AM

## 2020-07-21 ENCOUNTER — Inpatient Hospital Stay (HOSPITAL_COMMUNITY): Payer: HMO

## 2020-07-21 DIAGNOSIS — I10 Essential (primary) hypertension: Secondary | ICD-10-CM | POA: Diagnosis not present

## 2020-07-21 LAB — COMPREHENSIVE METABOLIC PANEL
ALT: 6 U/L (ref 0–44)
AST: 16 U/L (ref 15–41)
Albumin: 3 g/dL — ABNORMAL LOW (ref 3.5–5.0)
Alkaline Phosphatase: 49 U/L (ref 38–126)
Anion gap: 9 (ref 5–15)
BUN: 17 mg/dL (ref 8–23)
CO2: 26 mmol/L (ref 22–32)
Calcium: 8.9 mg/dL (ref 8.9–10.3)
Chloride: 105 mmol/L (ref 98–111)
Creatinine, Ser: 0.65 mg/dL (ref 0.61–1.24)
GFR, Estimated: >60 mL/min (ref >60)
Glucose, Bld: 111 mg/dL — ABNORMAL HIGH (ref 70–99)
Potassium: 3.5 mmol/L (ref 3.5–5.1)
Sodium: 140 mmol/L (ref 135–145)
Total Bilirubin: 0.6 mg/dL (ref 0.3–1.2)
Total Protein: 5.5 g/dL — ABNORMAL LOW (ref 6.5–8.1)

## 2020-07-21 LAB — CBC
HCT: 33.3 % — ABNORMAL LOW (ref 39.0–52.0)
Hemoglobin: 10.9 g/dL — ABNORMAL LOW (ref 13.0–17.0)
MCH: 29.9 pg (ref 26.0–34.0)
MCHC: 32.7 g/dL (ref 30.0–36.0)
MCV: 91.5 fL (ref 80.0–100.0)
Platelets: 167 10*3/uL (ref 150–400)
RBC: 3.64 MIL/uL — ABNORMAL LOW (ref 4.22–5.81)
RDW: 13.2 % (ref 11.5–15.5)
WBC: 6.6 10*3/uL (ref 4.0–10.5)
nRBC: 0 % (ref 0.0–0.2)

## 2020-07-21 LAB — GLUCOSE, CAPILLARY
Glucose-Capillary: 141 mg/dL — ABNORMAL HIGH (ref 70–99)
Glucose-Capillary: 147 mg/dL — ABNORMAL HIGH (ref 70–99)

## 2020-07-21 MED ORDER — LEVETIRACETAM 750 MG PO TABS: 750.0000 mg | ORAL_TABLET | Freq: Two times a day (BID) | ORAL | Status: DC

## 2020-07-21 MED ORDER — VALPROIC ACID 250 MG PO CAPS
500.0000 mg | ORAL_CAPSULE | Freq: Two times a day (BID) | ORAL | Status: DC
  Administered 2020-07-21 – 2020-07-22 (×4): 500 mg via ORAL
  Filled 2020-07-21 (×5): qty 2

## 2020-07-21 MED ORDER — LEVETIRACETAM IN NACL 500 MG/100ML IV SOLN: 500.0000 mg | Freq: Once | INTRAVENOUS | Status: DC

## 2020-07-21 NOTE — Progress Notes (Signed)
SLP Cancellation Note  Patient Details Name: Justin Gomez MRN: [id number] DOB: 04/20/1941   Cancelled treatment:        Pt going for procedure when therapist arrived. Will follow up tomorrow. This pt will likely need an MBS to fully assess swallow function and advance from thickened liquids.    Houston Siren 07/21/2020, 1:33 PM Orbie Pyo Colvin Caroli.Ed Risk analyst [phone] Office [phone]

## 2020-07-21 NOTE — Progress Notes (Signed)
OT notified staff that patient was displaying r sided facial drop; no new swallow difficulties noted today and no new unilateral deficits observed. OOB with PT, no new or worsening imbalance noted. Dr Avon Gully appraised of findings.

## 2020-07-21 NOTE — Progress Notes (Signed)
PROGRESS NOTE    Justin Gomez  VFI:[id number] DOB: [birth year] DOA: 07/18/2020 PCP: Jani Gravel, MD   Brief Narrative:  Justin Gomez is a 80 y.o. male with medical history significant of CVA, hypertension, GERD, Parkinson's disease, postural hypotension,'s diabetes, hypothyroidism, recent admission for subdural hematoma who presents with some intermittent confusion and difficulty speaking for the past 2 days. Patient was recently admitted from 1/21-1/22 after he had a fall with note subdural hematoma and associated slurred speech - repeat imaging negative and subsequently discharged home on keppra due to questionable seizure like activity. About 48h after starting keppra/returning home patient had increased intermittent confusion and difficulty speaking. In the ED: CT head showed a slight decrease of subdural hematoma at the left frontal parietal region which as above is decreased from 1/22.  There is a new thin acute subdural hematoma at the left tentorium which is consistent with an extension of the previous bleed.  No new/acute bleed suspected.  Neurosurgery consulted by EDP who states that due to this being an extension and not significant in size there is no indication for operative intervention, some irritation may be causing his waxing and waning symptoms.  Neurology was consulted as well, TRH requested to admit.  Assessment & Plan:   Principal Problem:   Intracranial bleeding (HCC) Active Problems:   Essential hypertension   Type 2 diabetes mellitus without complication, without long-term current use of insulin (HCC)   Gastroesophageal reflux disease   Postural hypotension   Cerebral thrombosis with cerebral infarction   Parkinson's disease   SDH (subdural hematoma) (HCC)   ICH (intracerebral hemorrhage) (HCC)  Intracranial bleeding with recent subdural hematoma Acute onset delirium/behavioral changes/encephalopathy, resolving Rule out atypical seizure/todds paralysis Rule out  polypharmacy - At baseline per wife prior to fall and brain bleed- no speech deficits; standup/walked without assistance; minimal help with dressing; AOx4 with 'sharp mind' but occasionally became confused or misplaced items frequently -Patient's mental status is improving drastically compared to admission, he is much more awake alert, cooperative.  Unfortunately patient's speech appears to be more slurred and undecipherable this morning compared to yesterday. - Neurosurgery consulted in ED who states this is a nonoperative case due to minimal bleeding and expected extension on CT head - Neurology following, EEG shows diffuse encephalopathy without epileptiform discharges, appreciate their evaluation today with patient's worsening speech patterns. -Strength intact and equal bilaterally, no other deficits noted other than speech   Chronic Parkinson's disease - Continue home Sinemet  Urinary frequency and incotninence - Continue Myrbetriq and finasteride - Place external urinary catheter  Diabetes - Continue Metformin once able to take p.o. safely - CBG monitoring  Hypertension - Continue home losartan once able to take p.o. safely -Continue as needed medications in the meantime IV  History of CVA - Continue home atorvastatin once able to take p.o. safely  GERD - Continue home PPI  Hypothyroidism - Continue home Synthroid once able to take p.o. safely  DVT prophylaxis: SCDs Code Status: Full Family Communication: Wife updated - discussed patient's prognosis, current situation disposition and treatment.  Status is: Inpatient  Dispo: The patient is from: Home              Anticipated d/c is to: To be determined              Anticipated d/c date is: 48 to 72 hours              Patient currently not medically stable for discharge  Consultants:   Neurology, neurosurgery  Procedures:   None indicated  Antimicrobials:  None indicated  Subjective: No acute issues or  events overnight, early this morning patient speech noted to be somewhat worse than previous baseline, patient denies headache, vision changes, weakness, nausea, vomiting, diarrhea, constipation, fevers, chills.  Objective: Vitals:   07/20/20 1617 07/20/20 1937 07/21/20 0348 07/21/20 0720  BP: (!) 124/53 (!) 168/85 (!) 153/72 (!) 150/72  Pulse: 80 92 74 71  Resp: 17 18 17 18   Temp: 99 F (37.2 C) 99 F (37.2 C) 98.1 F (36.7 C) 98.3 F (36.8 C)  TempSrc: Axillary Axillary Oral Oral  SpO2: 97%  97% 95%    Intake/Output Summary (Last 24 hours) at 07/21/2020 0745 Last data filed at 07/21/2020 [id number] Gross per 24 hour  Intake 1609.62 ml  Output 200 ml  Net 1409.62 ml   There were no vitals filed for this visit.  Examination:  General: Pleasantly resting in bed, awake alert, orientation difficult given patient's speech but reacting and interacting appropriately HEENT:  Normocephalic atraumatic.  Sclerae nonicteric, noninjected.  Extraocular movements intact bilaterally. Neck:  Without mass or deformity.  Trachea is midline. Lungs:  Clear to auscultate bilaterally without rhonchi, wheeze, or rales. Heart:  Regular rate and rhythm.  Without murmurs, rubs, or gallops. Abdomen:  Soft, nontender, nondistended.  Without guarding or rebound. Extremities: Without cyanosis, clubbing, edema, or obvious deformity.  Moving all limbs attempting to push myself and staff away with appropriate strength Vascular:  Dorsalis pedis and posterior tibial pulses palpable bilaterally. Skin:  Warm and dry, no erythema, no ulcerations.  Data Reviewed: I have personally reviewed following labs and imaging studies  CBC: Recent Labs  Lab 07/18/20 1136 07/19/20 0333 07/20/20 0335 07/21/20 0423  WBC 7.0 10.2 7.9 6.6  NEUTROABS 5.5  --   --   --   HGB 11.8* 12.0* 11.3* 10.9*  HCT 36.3* 35.5* 34.2* 33.3*  MCV 93.1 92.9 91.0 91.5  PLT 165 185 190 [id number]   Basic Metabolic Panel: Recent Labs  Lab  07/18/20 1136 07/19/20 0333 07/20/20 0335 07/21/20 0423  NA 138 140 140 140  K 4.1 3.8 3.8 3.5  CL 102 103 104 105  CO2 26 24 24 26   GLUCOSE 124* 100* 88 111*  BUN 20 18 16 17   CREATININE 0.79 0.74 0.84 0.65  CALCIUM 9.1 9.2 9.0 8.9   GFR: Estimated Creatinine Clearance: 76.6 mL/min (by C-G formula based on SCr of 0.65 mg/dL). Liver Function Tests: Recent Labs  Lab 07/18/20 1136 07/19/20 0333 07/20/20 0335 07/21/20 0423  AST 13* 15 15 16   ALT 5 6 18 6   ALKPHOS 67 65 65 49  BILITOT 1.1 1.3* 1.6* 0.6  PROT 6.3* 6.7 6.1* 5.5*  ALBUMIN 3.6 3.7 3.4* 3.0*   No results for input(s): LIPASE, AMYLASE in the last 168 hours. No results for input(s): AMMONIA in the last 168 hours. Coagulation Profile: Recent Labs  Lab 07/18/20 1136  INR 1.2   Cardiac Enzymes: No results for input(s): CKTOTAL, CKMB, CKMBINDEX, TROPONINI in the last 168 hours. BNP (last 3 results) No results for input(s): PROBNP in the last 8760 hours. HbA1C: No results for input(s): HGBA1C in the last 72 hours. CBG: No results for input(s): GLUCAP in the last 168 hours. Lipid Profile: No results for input(s): CHOL, HDL, LDLCALC, TRIG, CHOLHDL, LDLDIRECT in the last 72 hours. Thyroid Function Tests: No results for input(s): TSH, T4TOTAL, FREET4, T3FREE, THYROIDAB in the last 72 hours. Anemia Panel:  No results for input(s): VITAMINB12, FOLATE, FERRITIN, TIBC, IRON, RETICCTPCT in the last 72 hours. Sepsis Labs: No results for input(s): PROCALCITON, LATICACIDVEN in the last 168 hours.  Recent Results (from the past 240 hour(s))  SARS CORONAVIRUS 2 (TAT 6-24 HRS) Nasopharyngeal Nasopharyngeal Swab     Status: None   Collection Time: 07/18/20  4:53 PM   Specimen: Nasopharyngeal Swab  Result Value Ref Range Status   SARS Coronavirus 2 NEGATIVE NEGATIVE Final    Comment: (NOTE) SARS-CoV-2 target nucleic acids are NOT DETECTED.  The SARS-CoV-2 RNA is generally detectable in upper and lower respiratory  specimens during the acute phase of infection. Negative results do not preclude SARS-CoV-2 infection, do not rule out co-infections with other pathogens, and should not be used as the sole basis for treatment or other patient management decisions. Negative results must be combined with clinical observations, patient history, and epidemiological information. The expected result is Negative.  Fact Sheet for Patients: SugarRoll.be  Fact Sheet for Healthcare Providers: https://www.woods-mathews.com/  This test is not yet approved or cleared by the [place] FDA and  has been authorized for detection and/or diagnosis of SARS-CoV-2 by FDA under an Emergency Use Authorization (EUA). This EUA will remain  in effect (meaning this test can be used) for the duration of the COVID-19 declaration under Se ction 564(b)(1) of the Act, 21 U.S.C. section 360bbb-3(b)(1), unless the authorization is terminated or revoked sooner.  Performed at [facility], [place] [address] [place],  [address]     Radiology Studies: EEG adult  Result Date: 08/06/2020 Lora Havens, MD     2020-08-06  2:22 PM Patient Name: Justin Gomez MRN: [id number] Epilepsy Attending: Lora Havens Referring Physician/Provider: Date: 06-Aug-2020 Duration: 23.45 mins Patient history: 80 year old male with enlarging subdural hematoma and worsened confusion. EEG to evaluate for seizure Level of alertness: Awake AEDs during EEG study: LEV Technical aspects: This EEG study was done with scalp electrodes positioned according to the 10-20 International system of electrode placement. Electrical activity was acquired at a sampling rate of 500Hz  and reviewed with a high frequency filter of 70Hz  and a low frequency filter of 1Hz . EEG data were recorded continuously and digitally stored. Description: No posterior dominant rhythm was seen. EEG showed continuous generalized and maximal left  tempora region5-7hz  theta as well as 2-3 hz delta slowing.  Hyperventilation and photic stimulation were not performed.   ABNORMALITY -Continuous slow, generalized IMPRESSION: This study is suggestive of cortical dysfunction in left temporal region likely secondary to underlying bleed. There is also moderate diffuse encephalopathy, nonspecific etiology. No seizures or epileptiform discharges were seen throughout the recording. Priyanka Barbra Sarks   Scheduled Meds: . atorvastatin  20 mg Oral q1800  . carbidopa-levodopa  2 tablet Oral TID  . finasteride  5 mg Oral Daily  . levETIRAcetam  500 mg Oral BID  . levothyroxine  75 mcg Oral Daily  . losartan  25 mg Oral Daily  . metFORMIN  750 mg Oral BID  . mirabegron ER  50 mg Oral Daily  . pantoprazole  40 mg Oral Daily  . sodium chloride flush  3 mL Intravenous Q12H   Continuous Infusions: . lactated ringers 50 mL/hr at 2020/08/06 1811     LOS: 2 days   Time spent: 40 minutes  Justin Ishikawa, DO [facility]  If 7PM-7AM, please contact night-coverage [email or web address]  07/21/2020, 7:45 AM

## 2020-07-21 NOTE — Progress Notes (Signed)
Physical Therapy Treatment Patient Details Name: Justin Gomez MRN: [id number] DOB: 1940-11-26 Today's Date: 07/21/2020    History of Present Illness Justin Gomez is a 80 y.o. male with medical history significant of CVA, hypertension, GERD, Parkinson's disease, postural hypotension,'s diabetes, hypothyroidism, recent admission 1-21-1/22 for subdural hematoma s/p fall who presents with some intermittent confusion and difficulty speaking for the past 2 days. Pt found to have new thin acute subdural hematoma at L tentorium.    PT Comments    Upon arrival, pt was finishing session with OT and began to display an acute change in his speech and presentation, having difficulty forming and articulating words with him unable to state his name clearly and with R facial droop. RN immediately notified with vitals checks. RN cleared pt for PT session. MD also watched pt with gait and reported pt appeared to be presenting with no acute changes in his balance. Provided visual cues by directing pt to place one foot per square tile to increase his step length and to exaggerate his movements to increase foot clearance to decrease risk of falls, mod success. However, pt is at risk for injury and falls as he did display 1 LOB, needing minA to recover, and a tendency to drift to R with decreased attention to R side bumping obstacles intermittently. Will continue to follow acutely. Current recommendations remain appropriate.   Follow Up Recommendations  SNF;Supervision/Assistance - 24 hour (pt wants home but wife wants SNF)     Equipment Recommendations  None recommended by PT    Recommendations for Other Services       Precautions / Restrictions Precautions Precautions: Fall Precaution Comments: seizure precautions Restrictions Weight Bearing Restrictions: No    Mobility  Bed Mobility Overal bed mobility: Needs Assistance Bed Mobility: Supine to Sit     Supine to sit: Mod assist     General bed  mobility comments: Pt sitting up in recliner with OT upon arrival.  Transfers Overall transfer level: Needs assistance Equipment used: Rolling walker (2 wheeled) Transfers: Sit to/from Stand Sit to Stand: Min guard         General transfer comment: max verbal cues for safe hand placement as pt trying to pull up on walker and falling backwards, once corrected pt able to power up to stand with min guard and extra time.  Ambulation/Gait Ambulation/Gait assistance: Min assist;Min guard Gait Distance (Feet): 200 Feet Assistive device: Rolling walker (2 wheeled) Gait Pattern/deviations: Step-through pattern;Decreased stride length;Shuffle;Decreased step length - right;Decreased step length - left;Decreased dorsiflexion - right;Decreased dorsiflexion - left;Trunk flexed;Narrow base of support (parkinsonian type pattern) Gait velocity: reduced Gait velocity interpretation: 1.31 - 2.62 ft/sec, indicative of limited community ambulator General Gait Details: pt with short, shuffled steps, cuing pt to increase step length and height with use of visual cues from "squares" of tiles on ground and to exaggerate movement, mod success. Pt tends to drift to R and collide with obstacles on the R, needing cues to avoid for safety. 1 LOB, needing minA to recover. Practiced "red light, green light" for stop and go with pt able to perform both when cued, but unsteadiness with stopping.   Stairs             Wheelchair Mobility    Modified Rankin (Stroke Patients Only) Modified Rankin (Stroke Patients Only) Pre-Morbid Rankin Score: Moderate disability Modified Rankin: Moderately severe disability     Balance Overall balance assessment: Mild deficits observed, not formally tested Sitting-balance support: Feet supported;No upper  extremity supported Sitting balance-Leahy Scale: Good       Standing balance-Leahy Scale: Poor Standing balance comment: bilat UE support on walker                             Cognition Arousal/Alertness: Awake/alert Behavior During Therapy: WFL for tasks assessed/performed Overall Cognitive Status: Impaired/Different from baseline Area of Impairment: Memory                     Memory: Decreased short-term memory         General Comments: Pt unable to remember his wife's phone number.      Exercises      General Comments General comments (skin integrity, edema, etc.): patient with new onset slurred speech with OT upon arrival. Unable to say name or ABCs and R facial droop noted. RN notified. Vitals assessed; BP 115/74, HR 95bpm, and SpO2 96% on RA. Grip strength even bilaterally, per OT. MD watched pt with gait and reported similar balance as usual.      Pertinent Vitals/Pain Pain Assessment: Faces Faces Pain Scale: No hurt Pain Intervention(s): Monitored during session    Home Living Family/patient expects to be discharged to:: Private residence Living Arrangements: Spouse/significant other Available Help at Discharge: Family;Available 24 hours/day (however spouse unable to physical assist) Type of Home: Apartment Home Access: Stairs to enter Entrance Stairs-Rails: Left Home Layout: One level Home Equipment: Walker - 2 wheels;Grab bars - toilet;Shower seat      Prior Function Level of Independence: Needs assistance  Gait / Transfers Assistance Needed: pt was ambulating without AD ADL's / Homemaking Assistance Needed: wife reports patient didn't want help with ADLs however will not want to bath or change clothes 2/2 increased difficulty.     PT Goals (current goals can now be found in the care plan section) Acute Rehab PT Goals Patient Stated Goal: To return home. PT Goal Formulation: With patient Time For Goal Achievement: 07/26/2020 Potential to Achieve Goals: Good Progress towards PT goals: Progressing toward goals    Frequency    Min 3X/week      PT Plan Current plan remains appropriate     Co-evaluation              AM-PAC PT "6 Clicks" Mobility   Outcome Measure  Help needed turning from your back to your side while in a flat bed without using bedrails?: A Little Help needed moving from lying on your back to sitting on the side of a flat bed without using bedrails?: A Little Help needed moving to and from a bed to a chair (including a wheelchair)?: A Little Help needed standing up from a chair using your arms (e.g., wheelchair or bedside chair)?: A Little Help needed to walk in hospital room?: A Little Help needed climbing 3-5 steps with a railing? : A Lot 6 Click Score: 17    End of Session Equipment Utilized During Treatment: Gait belt Activity Tolerance: Patient tolerated treatment well Patient left: in chair;with call bell/phone within reach;with chair alarm set Nurse Communication: Mobility status;Other (comment) (acute speech changes and R facial droop) PT Visit Diagnosis: Unsteadiness on feet (R26.81);Difficulty in walking, not elsewhere classified (R26.2);Other abnormalities of gait and mobility (R26.89);History of falling (Z91.81);Other symptoms and signs involving the nervous system (R29.898)     Time: 1191-4782 PT Time Calculation (min) (ACUTE ONLY): 20 min  Charges:  $Gait Training: 8-22 mins  Moishe Spice, PT, DPT Acute Rehabilitation Services  Pager: [phone] Office: [place] 07/21/2020, 10:56 AM

## 2020-07-21 NOTE — Consult Note (Signed)
Neurology Consultation  Reason for Consult: right facial droop Referring Physician: Dr. Avon Gully  CC: Right facial droop  History is obtained from: chart review, patient, wife at patient bedside  HPI: Justin Gomez is a 80 y.o. male with a PMHx of Parkinson's disease, multisystem atrophy, postural hypotension, stroke, HTN, HLD, hypothyroidism and DM2 who sustained a recent traumatic left sided subdural hematoma on 1/21. He did not undergo surgical intervention at that time. He was discharged home and returned 07/18/20 with approximately 2 days of fluctuating confusion and lethargy noted by his wife. Wife further specified his confusion as including taking cornbread with ice cream and putting a tray of cornbread in the freezer. CT was obtained at this time which showed a slight decrease in size of the previous subdural in the left frontoparietal lobe but showed new thin amount of acute subdural overlying the left tentorium.   Of note, the patient had a seizure after his initial admission for the subdural. He had been started on Keppra and had been compliant with such but did not start the medication until Thursday or Friday per wife. He was noted to be combative and agitated requiring Geodon in the ED on 07/19/20. EEG was complete on his re-admission on 07/19/20 due to increased confusion that revealed cortical dysfunction in the left temporal region but no seizures or epileptiform discharges were seen at this time.   07/21/2020: Neurology was re-consulted by attending provider due to occupational therapy and RN visualizing an acute right-sided facial drooping this morning around 10:00. On assessment at 11:00, there was no further facial drooping noted though his wife reports that he has had transient right facial droop since his first hospital admission (though evidence of right facial droop not seen on previous clinician assessments). Of note is fluctuating severity of aphasia on examination. Per wife  his speech has fluctuated since his initial injury on 07/09/2020.  ROS: A 14 point ROS was performed and is negative except as noted in the HPI.   Past Medical History:  Diagnosis Date  . BPH (benign prostatic hypertrophy)   . 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 Diverticulosis   . Essential hypertension 11/29/2015  . Gastroesophageal reflux disease 04/13/2016  . Hiatal hernia   . Hyperlipidemia   . Hypothyroidism   . Lumbar herniated disc    L4  . Mild neurocognitive disorder, unclear etiology 09/24/2019  . Multiple system atrophy   . Parkinson's disease 07/31/2017  . Postural dizziness with near syncope 07/14/2017  . Postural hypotension 01/22/2017  . 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 Type 2 diabetes mellitus without complication, without long-term current use of insulin ([place]) 11/29/2015   Family History  Problem Relation Age of Onset  . Cerebral aneurysm Father   . Hypertension Father   . Diabetes Mother   . Hypertension Brother   . Diabetes Sister   . Hypertension Sister   . Healthy Son   . Diverticulitis Daughter   . Colon cancer Neg Hx    Social History:   reports that he has never smoked. He has never used smokeless tobacco. He reports current alcohol use. He reports that he does not use drugs.  Medications Current Outpatient Medications  Medication Instructions  . atorvastatin (LIPITOR) 20 mg, Oral, Daily-1800  . carbidopa-levodopa (SINEMET IR) 25-100 MG tablet 2 tablets, Oral, 3 times daily  . finasteride (PROSCAR) 5 mg, Oral, Daily  . levETIRAcetam (KEPPRA) 500 mg, Oral, 2 times daily  .  levothyroxine (SYNTHROID) 75 mcg, Oral, Daily  . losartan (COZAAR) 25 mg, Oral, Daily  . metFORMIN (GLUCOPHAGE-XR) 750 mg, Oral, 2 times daily  . mirabegron ER (MYRBETRIQ) 50 mg, Oral, Daily  . pantoprazole (PROTONIX) 40 mg, Oral, Daily   Exam: Current vital signs: BP 115/74   Pulse 71   Temp 98.3 F (36.8 C) (Oral)    Resp 16   SpO2 94%  Vital signs in last 24 hours: Temp:  [98.1 F (36.7 C)-99 F (37.2 C)] 98.3 F (36.8 C) (02/02 0720) Pulse Rate:  [71-92] 71 (02/02 0720) Resp:  [16-18] 16 (02/02 1002) BP: (115-168)/(53-85) 115/74 (02/02 1002) SpO2:  [94 %-97 %] 94 % (02/02 1002)  GENERAL: Awake, alert, no acute distress, sitting up in recliner at bedside PSYCH: appropriate mood for situation, slightly anxious, repeatedly asking to get out of the hospital and go home HEAD: - Normocephalic and atraumatic EENT: Normal conjunctiva, no OP obstruction LUNGS - Normal respiratory effort, symmetric chest rise with inspiration CV - Extremities warm, well perfused ABDOMEN - Soft, non-tender, non-distended Ext: warm, without edema  NEURO:  Mental Status: alert and oriented to person, place, time, and situation. Speech is slightly hypophonic and with varying degrees of aphasia. Some words and sentences more clear, other times, unintelligible speech. Follows all commands. Speech is aphasic without dysarthria.  Naming and comprehension intact.  Cranial Nerves:  II: PERRL 3 mm/brisk. Visual fields full, tracks examiner III, IV, VI: EOM unable to fully attend horizontally left or right.  V: Sensation is intact and symmetric to light touch on the face.  VII: Face is symmetric resting and smiling. Able to puff cheeks and raise eyebrows.  VIII: Hhearing intact to voice IX, X: Palate elevation is symmetric. Hypophonic voice.  XI: Normal sternocleidomastoid and trapezius muscle strength XII: Tongue protrudes midline. Motor: 5/5 strength is all muscle groups with antigravity movement. No drift noted in bilateral upper or lower extremities. Tone is mildly increased. Bulk is normal.  Sensation- Intact to light touch bilaterally in all four extremities. Extinction intact. 2 point discrimination.  Coordination: FTN intact bilaterally. HKS intact bilaterally. No pronator drift.  Gait- deferred  Labs I have reviewed  labs in epic and the results pertinent to this consultation are: CBC    Component Value Date/Time   WBC 6.6 07/21/2020 0423   RBC 3.64 (L) 07/21/2020 0423   HGB 10.9 (L) 07/21/2020 0423   HCT 33.3 (L) 07/21/2020 0423   PLT 167 07/21/2020 0423   MCV 91.5 07/21/2020 0423   MCH 29.9 07/21/2020 0423   MCHC 32.7 07/21/2020 0423   RDW 13.2 07/21/2020 0423   LYMPHSABS 1.0 07/18/2020 1136   MONOABS 0.5 07/18/2020 1136   EOSABS 0.0 07/18/2020 1136   BASOSABS 0.0 07/18/2020 1136  CMP     Component Value Date/Time   NA 140 07/21/2020 0423   K 3.5 07/21/2020 0423   CL 105 07/21/2020 0423   CO2 26 07/21/2020 0423   GLUCOSE 111 (H) 07/21/2020 0423   BUN 17 07/21/2020 0423   CREATININE 0.65 07/21/2020 0423   CALCIUM 8.9 07/21/2020 0423   PROT 5.5 (L) 07/21/2020 0423   ALBUMIN 3.0 (L) 07/21/2020 0423   AST 16 07/21/2020 0423   ALT 6 07/21/2020 0423   ALKPHOS 49 07/21/2020 0423   BILITOT 0.6 07/21/2020 0423   GFRNONAA >60 07/21/2020 0423   GFRAA >60 07/16/2017 0221   Lipid Panel     Component Value Date/Time   CHOL 141 07/15/2017 1523  TRIG 116 07/15/2017 1523   HDL 30 (L) 07/15/2017 1523   CHOLHDL 4.7 07/15/2017 1523   VLDL 23 07/15/2017 1523   LDLCALC 88 07/15/2017 1523     Imaging I have reviewed the images obtained: 07/18/20 CT-scan of the brain IMPRESSION: 1. Slight decrease in size of the subdural hemorrhage overlying the LEFT frontoparietal lobe compared to head CT of 07/10/2020, and also decreased in density per expected aging (now subacute in appearance). Mild mass effect with sulcal effacement in the LEFT frontoparietal lobe, but no midline shift or herniation. 2. There is now a thin amount of acute-appearing subdural hemorrhage seen overlying the LEFT tentorium, likely interval extension from the aforementioned hemorrhage overlying the LEFT frontoparietal lobe. 3. No evidence of additional acute intracranial hemorrhage. 4. Mild chronic small vessel ischemic changes  within the white matter.  07/09/2020 MRI examination of the brain IMPRESSION: Unchanged acute left cerebral convexity subdural hematoma measuring 1.0 cm. Mild cerebral atrophy.  Tiny remote right cerebellar lacunar insult.  EEG 07/19/20 IMPRESSION: This study is suggestive of cortical dysfunction in left temporal region likely secondary to underlying bleed. There is also moderate diffuse encephalopathy, nonspecific etiology. No seizures or epileptiform discharges were seen throughout the recording.  Assessment:  80 year old male with enlarging subdural hematoma and worsened confusion 1. Exam reveals patient is hypophonic with fluctuating severity of dysarthric speech.  He does have mildly increased tone x 4 as well as hypomimia, consistent with his known Parkinson's disease. Right facial droop noted by OT and RN resolved on neurology initial assessment today 07/21/2020. 2. Discharged home with progressive increase in confusion with return to ED 07/19/20, patient with agitation and combative behavior requiring Geodon. Keppra known to contribute to agitation and aggressive behavior.  3. CT head: Slight decrease in size of the subdural hemorrhage overlying the LEFT frontoparietal lobe compared to head CT of 07/10/2020, and also decreased in density per expected aging (now subacute in appearance). Mild mass effect with sulcal effacement in the LEFT frontoparietal lobe, but no midline shift or herniation. There is now a thin amount of acute-appearing subdural hemorrhage seen overlying the LEFT tentorium, likely interval extension from the aforementioned hemorrhage overlying the LEFT frontoparietal lobe. No evidence of additional acute intracranial hemorrhage. Mild chronic small vessel ischemic changes within the white matter.  Recommendations: - Due to combative behavior requiring Geodon 07/19/20, increasing confusion, and stuttering/fluctuating neurologic symptoms, Keppra discontinued and Depakote initiated  for seizure prophylaxis. Will re-evaluate but expect some cognitive fog/lethargy following Depakote administration.  - CTH pending for evaluation of transient neuro deficit - Consider repeating EEG with fluctuating mental status or seizures with AED change - Frequent neuro checks - Neurology will follow patient  Pt seen by NP alongside MD. Note/plan to be edited by MD as needed.  Anibal Henderson, AGAC-NP [facility] Pager: [phone]

## 2020-07-21 NOTE — TOC Initial Note (Signed)
Transition of Care [facility]) - Initial/Assessment Note    Patient Details  Name: Justin Gomez MRN: [id number] Date of Birth: [birth year]  [facility]) CM/SW Contact:    Marney Setting, [place] Work Phone Number: 07/21/2020, 3:18 PM  Clinical Narrative: MSW Student spoke with patient's wife Justin Gomez about SNF recommendation and Justin Gomez agreed to pursue a SNF placement and prefers one in [place]. MSW Student will follow up with options for patient.                   Expected Discharge Plan: Skilled Nursing Facility Barriers to Discharge: Continued Medical Work up   Patient Goals and CMS Choice Patient states their goals for this hospitalization and ongoing recovery are:: PT unable to participate only oriented to self      Expected Discharge Plan and Services Expected Discharge Plan: [place] arrangements for the past 2 months: Single Family Home                                      Prior Living Arrangements/Services Living arrangements for the past 2 months: Single Family Home Lives with:: Spouse Patient language and need for interpreter reviewed:: Yes Do you feel safe going back to the place where you live?: Yes      Need for Family Participation in Patient Care: Yes (Comment) Care giver support system in place?: No (comment)   Criminal Activity/Legal Involvement Pertinent to Current Situation/Hospitalization: No - Comment as needed  Activities of Daily Living      Permission Sought/Granted Permission sought to share information with : Family Chief Financial Officer Permission granted to share information with : Yes, Verbal Permission Granted  Share Information with NAME: Justin Gomez     Permission granted to share info w Relationship: Spouse     Emotional Assessment Appearance:: Appears stated age Attitude/Demeanor/Rapport: Unable to Assess Affect (typically observed): Unable to  Assess Orientation: : Oriented to Self Alcohol / Substance Use: Not Applicable Psych Involvement: No (comment)  Admission diagnosis:  Confusion [R41.0] Intracranial bleeding (HCC) [I62.9] Intracranial bleed (HCC) [I62.9] ICH (intracerebral hemorrhage) ([place]) [I61.9] Patient Active Problem List   Diagnosis Date Noted  . ICH (intracerebral hemorrhage) ([place]) 07/19/2020  . Intracranial bleeding ([place]) 07/18/2020  . SDH (subdural hematoma) ([place]) 07/09/2020  . Mild neurocognitive disorder, unclear etiology 09/24/2019  . Parkinson's disease 07/31/2017  . 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 Fall   . Multiple system atrophy   . 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 Postural dizziness with near syncope 07/14/2017  . Postural hypotension 01/22/2017  . Gastroesophageal reflux disease 04/13/2016  . Cough 04/13/2016  . 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 Essential hypertension 11/29/2015  . Type 2 diabetes mellitus without complication, without long-term current use of insulin ([place]) 11/29/2015   PCP:  Jani Gravel, MD Pharmacy:   [facility] [phone] [address] [place], [place] - [address] [place] [address] [place] [address] Phone: [phone] Fax: [place] [address], [place] - [address] [place] [place] [address] Phone: [phone] Fax: [phone]     Social Determinants of Health (SDOH) Interventions    Readmission Risk Interventions No flowsheet data found.

## 2020-07-21 NOTE — NC FL2 (Addendum)
[place] LEVEL OF CARE SCREENING TOOL     IDENTIFICATION  Patient Name: Justin Gomez Birthdate: Mar 17, 1941 Sex: male Admission Date (Current Location): 07/18/2020  [facility] and [place] Number:  Herbalist and Address:  [facility], [place] [address], [place], [place] [address]      Provider Number: [id number]  Attending Physician Name and Address:  Little Ishikawa, MD  Relative Name and Phone Number:       Current Level of Care: Hospital Recommended Level of Care: [place] Prior Approval Number:    Date Approved/Denied:   PASRR Number: [id number]  Discharge Plan: SNF    Current Diagnoses: Patient Active Problem List   Diagnosis Date Noted  . ICH (intracerebral hemorrhage) ([place]) 07/19/2020  . Intracranial bleeding ([place]) 07/18/2020  . SDH (subdural hematoma) ([place]) 07/09/2020  . Mild neurocognitive disorder, unclear etiology 09/24/2019  . Parkinson's disease 07/31/2017  . 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 Fall   . Multiple system atrophy   . 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 Postural dizziness with near syncope 07/14/2017  . Postural hypotension 01/22/2017  . Gastroesophageal reflux disease 04/13/2016  . Cough 04/13/2016  . 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 Essential hypertension 11/29/2015  . Type 2 diabetes mellitus without complication, without long-term current use of insulin ([place]) 11/29/2015    Orientation RESPIRATION BLADDER Height & Weight     Self  Normal Continent Weight:   Height:     BEHAVIORAL SYMPTOMS/MOOD NEUROLOGICAL BOWEL NUTRITION STATUS      Continent Diet (See DC Summary)  AMBULATORY STATUS COMMUNICATION OF NEEDS Skin   Limited Assist Verbally Normal                       Personal Care Assistance Level of Assistance  Bathing,Feeding,Dressing Bathing Assistance: Maximum assistance Feeding assistance:  Limited assistance Dressing Assistance: Limited assistance     Functional Limitations Info  Sight,Hearing,Speech Sight Info: Adequate Hearing Info: Adequate Speech Info: Impaired    SPECIAL CARE FACTORS FREQUENCY  PT (By licensed PT),OT (By licensed OT),Speech therapy     PT Frequency: 5xweek OT Frequency: 5xweek     Speech Therapy Frequency: 5xweek      Contractures Contractures Info: Not present    Additional Factors Info  Code Status,Allergies Code Status Info: Full Allergies Info: Benzonatate           Current Medications (07/21/2020):  This is the current hospital active medication list Current Facility-Administered Medications  Medication Dose Route Frequency Provider Last Rate Last Admin  . acetaminophen (TYLENOL) tablet 650 mg  650 mg Oral Q6H PRN Marcelyn Bruins, MD       Or  . acetaminophen (TYLENOL) suppository 650 mg  650 mg Rectal Q6H PRN Marcelyn Bruins, MD      . atorvastatin (LIPITOR) tablet 20 mg  20 mg Oral q1800 Marcelyn Bruins, MD   20 mg at 07/20/20 1636  . carbidopa-levodopa (SINEMET IR) 25-100 MG per tablet immediate release 2 tablet  2 tablet Oral TID Marcelyn Bruins, MD   2 tablet at 07/21/20 0815  . finasteride (PROSCAR) tablet 5 mg  5 mg Oral Daily Marcelyn Bruins, MD   5 mg at 07/21/20 1962  . lactated ringers infusion   Intravenous Continuous Little Ishikawa, MD 50 mL/hr at 07/19/20 [place] at 07/19/20 1811  . levothyroxine (SYNTHROID) tablet 75  mcg  75 mcg Oral Daily Marcelyn Bruins, MD   75 mcg at 07/21/20 0815  . losartan (COZAAR) tablet 25 mg  25 mg Oral Daily Marcelyn Bruins, MD   25 mg at 07/21/20 0815  . metFORMIN (GLUCOPHAGE-XR) 24 hr tablet 750 mg  750 mg Oral BID Marcelyn Bruins, MD   750 mg at 07/21/20 0815  . mirabegron ER (MYRBETRIQ) tablet 50 mg  50 mg Oral Daily Marcelyn Bruins, MD   50 mg at 07/21/20 9532  . pantoprazole (PROTONIX) EC tablet 40 mg  40 mg Oral Daily Marcelyn Bruins, MD   40 mg at 07/21/20 0815  . polyethylene glycol (MIRALAX / GLYCOLAX) packet 17 g  17 g Oral Daily PRN Marcelyn Bruins, MD      . sodium chloride flush (NS) 0.9 % injection 3 mL  3 mL Intravenous Q12H Marcelyn Bruins, MD   3 mL at 07/20/20 2045  . valproic acid (DEPAKENE) 250 MG capsule 500 mg  500 mg Oral BID Rikki Spearing, NP         Discharge Medications: Please see discharge summary for a list of discharge medications.  Relevant Imaging Results:  Relevant Lab Results:   Additional Information SSN: [id number]  Marney Setting, Student-Social Work

## 2020-07-21 NOTE — Evaluation (Signed)
Occupational Therapy Evaluation Patient Details Name: ALDINE GRAINGER MRN: [id number] DOB: March 11, 1941 Today's Date: 07/21/2020    History of Present Illness JAXXON NAEEM is a 80 y.o. male with medical history significant of CVA, hypertension, GERD, Parkinson's disease, postural hypotension,'s diabetes, hypothyroidism, recent admission 1-21-1/22 for subdural hematoma s/p fall who presents with some intermittent confusion and difficulty speaking for the past 2 days. Pt found to have new thin acute subdural hematoma at L tentorium.   Clinical Impression   PTA patient was living in a 2nd floor apartment with his wife and a full flight of steps to enter. Per chart review, patient did not want external assistance with self-care tasks but would avoid tasks including bathing/dressing 2/2 increased difficulty. Patient currently demonstrates deficits including decreased balance, decreased knowledge of current deficits, and need for Min A overall for short-distance functional mobility and ADL tasks. Patient would benefit from continued acute OT services to maximize safety and independence with self-care tasks in prep for safe d/c to next level of care. Recommendation for SNF rehab as wife is unable to provide current level of assist or assist patient with stair negotiation. Patient with acute change in status at end of therapy session demonstrating slurred speech and R facial droop. RN notified. Please refer to "general comments" section below for additional details.     Follow Up Recommendations  SNF;Supervision/Assistance - 24 hour    Equipment Recommendations  None recommended by OT    Recommendations for Other Services       Precautions / Restrictions Precautions Precautions: Fall Precaution Comments: seizure precautions Restrictions Weight Bearing Restrictions: No      Mobility Bed Mobility Overal bed mobility: Needs Assistance Bed Mobility: Supine to Sit     Supine to sit: Mod  assist     General bed mobility comments: Patient able to advance BLE toward EOB but required Mod A to elevate trunk. HOB elevated to 40 degrees.    Transfers Overall transfer level: Needs assistance Equipment used: Rolling walker (2 wheeled) Transfers: Sit to/from Stand Sit to Stand: Min assist         General transfer comment: max verbal cues for safe hand placement as pt trying to pull up on walker and falling backwards, minA to power up and steady during transition of hands    Balance Overall balance assessment: Mild deficits observed, not formally tested Sitting-balance support: Feet supported;No upper extremity supported Sitting balance-Leahy Scale: Good       Standing balance-Leahy Scale: Fair Standing balance comment: bilat UE support on walker                           ADL either performed or assessed with clinical judgement   ADL Overall ADL's : Needs assistance/impaired     Grooming: Minimal assistance;Standing Grooming Details (indicate cue type and reason): Hand washing standing at sink level with Min A and no AD.             Lower Body Dressing: Minimal assistance;Sit to/from stand Lower Body Dressing Details (indicate cue type and reason): Able to adjsut bilateral socks seated EOB with close supervision A. Would likely require Min A to thread BLE and hike over hips in standing. Toilet Transfer: Minimal Print production planner Details (indicate cue type and reason): Simulated. Cues for hand placement and proximity to chair.         Functional mobility during ADLs: Minimal assistance General ADL Comments: +1 HHA and Min  A for short-distance mobility in room. Patient with shuffle-like gait.     Vision   Vision Assessment?: No apparent visual deficits     Perception     Praxis      Pertinent Vitals/Pain Pain Assessment: No/denies pain     Hand Dominance Right   Extremity/Trunk Assessment Upper Extremity Assessment Upper  Extremity Assessment: Overall WFL for tasks assessed   Lower Extremity Assessment Lower Extremity Assessment: Defer to PT evaluation   Cervical / Trunk Assessment Cervical / Trunk Assessment: Kyphotic   Communication Communication Communication: Expressive difficulties   Cognition Arousal/Alertness: Awake/alert Behavior During Therapy: WFL for tasks assessed/performed Overall Cognitive Status: Impaired/Different from baseline Area of Impairment: Memory                     Memory: Decreased short-term memory         General Comments: Patient states that he hadn't seen his wife in 3 days. Per chart review, patient present yesterday at time of PT eval. Patient unable to recall.   General Comments  Patient able to hold casual conversation and make needs known at start of session. After hand hygiene at sink level and short-distance functional mobility patient with new onset slurred speech. Unable to say name or ABCs and R facial droop noted. RN notified. Vitals assessed; BP 115/74, HR 95bpm, and SpO2 96% on RA. Grip strength even bilaterally.    Exercises     Shoulder Instructions      Home Living Family/patient expects to be discharged to:: Private residence Living Arrangements: Spouse/significant other Available Help at Discharge: Family;Available 24 hours/day (however spouse unable to physical assist) Type of Home: Apartment Home Access: Stairs to enter Entrance Stairs-Number of Steps: 1 flight/10 steps Entrance Stairs-Rails: Left Home Layout: One level     Bathroom Shower/Tub: Teacher, early years/pre: Standard     Home Equipment: Walker - 2 wheels;Grab bars - toilet;Shower seat          Prior Functioning/Environment Level of Independence: Needs assistance  Gait / Transfers Assistance Needed: pt was ambulating without AD ADL's / Homemaking Assistance Needed: wife reports patient didn't want help with ADLs however will not want to bath or change  clothes 2/2 increased difficulty. Communication / Swallowing Assistance Needed: pt on puree diet and very dysarthric          OT Problem List: Decreased activity tolerance;Impaired balance (sitting and/or standing);Decreased coordination;Decreased cognition;Decreased knowledge of use of DME or AE      OT Treatment/Interventions: Self-care/ADL training;Therapeutic exercise;Neuromuscular education;DME and/or AE instruction;Therapeutic activities;Cognitive remediation/compensation;Patient/family education;Balance training    OT Goals(Current goals can be found in the care plan section) Acute Rehab OT Goals Patient Stated Goal: To return home. OT Goal Formulation: With patient Time For Goal Achievement: 08/04/20 Potential to Achieve Goals: Good ADL Goals Pt Will Perform Eating: Independently;sitting Pt Will Perform Grooming: with supervision;standing Pt Will Perform Upper Body Dressing: with supervision;sitting Pt Will Perform Lower Body Dressing: with supervision;sit to/from stand Pt Will Transfer to Toilet: with supervision;ambulating Pt Will Perform Toileting - Clothing Manipulation and hygiene: with supervision;sit to/from stand Pt/caregiver will Perform Home Exercise Program: Both right and left upper extremity;With written HEP provided;With Supervision  OT Frequency: Min 2X/week   Barriers to D/C: Decreased caregiver support  Wife unable to provide current level of function.       Co-evaluation              AM-PAC OT "6 Clicks" Daily Activity  Outcome Measure Help from another person eating meals?: A Little (Purred diet and honey thick liquids) Help from another person taking care of personal grooming?: A Little Help from another person toileting, which includes using toliet, bedpan, or urinal?: A Little Help from another person bathing (including washing, rinsing, drying)?: A Little Help from another person to put on and taking off regular upper body clothing?: A  Little Help from another person to put on and taking off regular lower body clothing?: A Little 6 Click Score: 18   End of Session Equipment Utilized During Treatment: Gait belt Nurse Communication: Other (comment) (Patient with change in status.)  Activity Tolerance: Patient tolerated treatment well Patient left: in chair;with call bell/phone within reach;with nursing/sitter in room (RN to assess after change in status)  OT Visit Diagnosis: Unsteadiness on feet (R26.81);Other abnormalities of gait and mobility (R26.89);Muscle weakness (generalized) (M62.81)                Time: 3086-5784 OT Time Calculation (min): 25 min Charges:  OT General Charges $OT Visit: 1 Visit OT Evaluation $OT Eval Moderate Complexity: 1 Mod OT Treatments $Self Care/Home Management : 8-22 mins  Deaysia Grigoryan H. OTR/L Supplemental OT, Department of rehab services [phone]  Nasirah Sachs R H. 07/21/2020, 10:25 AM

## 2020-07-22 ENCOUNTER — Inpatient Hospital Stay (HOSPITAL_COMMUNITY): Payer: HMO

## 2020-07-22 DIAGNOSIS — S065X9A Traumatic subdural hemorrhage with loss of consciousness of unspecified duration, initial encounter: Principal | ICD-10-CM

## 2020-07-22 DIAGNOSIS — I10 Essential (primary) hypertension: Secondary | ICD-10-CM | POA: Diagnosis not present

## 2020-07-22 LAB — COMPREHENSIVE METABOLIC PANEL
ALT: 8 U/L (ref 0–44)
AST: 15 U/L (ref 15–41)
Albumin: 2.9 g/dL — ABNORMAL LOW (ref 3.5–5.0)
Alkaline Phosphatase: 53 U/L (ref 38–126)
Anion gap: 11 (ref 5–15)
BUN: 18 mg/dL (ref 8–23)
CO2: 28 mmol/L (ref 22–32)
Calcium: 8.8 mg/dL — ABNORMAL LOW (ref 8.9–10.3)
Chloride: 103 mmol/L (ref 98–111)
Creatinine, Ser: 0.86 mg/dL (ref 0.61–1.24)
GFR, Estimated: >60 mL/min (ref >60)
Glucose, Bld: 111 mg/dL — ABNORMAL HIGH (ref 70–99)
Potassium: 3.5 mmol/L (ref 3.5–5.1)
Sodium: 142 mmol/L (ref 135–145)
Total Bilirubin: 0.8 mg/dL (ref 0.3–1.2)
Total Protein: 5.6 g/dL — ABNORMAL LOW (ref 6.5–8.1)

## 2020-07-22 LAB — CBC
HCT: 32.4 % — ABNORMAL LOW (ref 39.0–52.0)
Hemoglobin: 11.3 g/dL — ABNORMAL LOW (ref 13.0–17.0)
MCH: 31.2 pg (ref 26.0–34.0)
MCHC: 34.9 g/dL (ref 30.0–36.0)
MCV: 89.5 fL (ref 80.0–100.0)
Platelets: 175 10*3/uL (ref 150–400)
RBC: 3.62 MIL/uL — ABNORMAL LOW (ref 4.22–5.81)
RDW: 13.2 % (ref 11.5–15.5)
WBC: 5.1 10*3/uL (ref 4.0–10.5)
nRBC: 0 % (ref 0.0–0.2)

## 2020-07-22 MED ORDER — BOOST / RESOURCE BREEZE PO LIQD CUSTOM
1.0000 | Freq: Three times a day (TID) | ORAL | Status: DC
  Administered 2020-07-22 – 2020-07-26 (×6): 1 via ORAL

## 2020-07-22 MED ORDER — DIPHENHYDRAMINE-ZINC ACETATE 2-0.1 % EX CREA
1.0000 "application " | TOPICAL_CREAM | Freq: Two times a day (BID) | CUTANEOUS | Status: DC | PRN
  Filled 2020-07-22: qty 28

## 2020-07-22 NOTE — Progress Notes (Signed)
Physical Therapy Treatment Patient Details Name: Justin Gomez MRN: [id number] DOB: [birth year] Today's Date: 07/22/2020    History of Present Illness Justin Gomez is a 80 y.o. male with medical history significant of CVA, hypertension, GERD, Parkinson's disease, postural hypotension,'s diabetes, hypothyroidism, recent admission 1-21-1/22 for subdural hematoma s/p fall who presents with some intermittent confusion and difficulty speaking for the past 2 days. Pt found to have new thin acute subdural hematoma at L tentorium.    PT Comments    Pt with improved bil step length and feet clearance this date, decreasing his risk for tripping and falling. However, he displays difficulty with negotiating around obstacles or in tight spaces or turning with the RW, displaying Parkinsonian shuffling gait. Practiced sequencing RW then feet with turns, but no success as he had difficulty stopping and re-initiating movement and sequencing the task. Will continue to follow acutely. Current recommendations remain appropriate.   Follow Up Recommendations  SNF;Supervision/Assistance - 24 hour (pt wants home but wife wants SNF)     Equipment Recommendations  None recommended by PT    Recommendations for Other Services       Precautions / Restrictions Precautions Precautions: Fall Precaution Comments: seizure precautions Restrictions Weight Bearing Restrictions: No    Mobility  Bed Mobility               General bed mobility comments: Pt sitting up in recliner upon arrival.  Transfers Overall transfer level: Needs assistance Equipment used: Rolling walker (2 wheeled) Transfers: Sit to/from Stand Sit to Stand: Min assist         General transfer comment: max verbal cues for safe hand placement as pt trying to pull up on walker and falling backwards, once corrected with 1 hand on RW and one on recliner arm rest pt able to power up to stand with minA and extra  time.  Ambulation/Gait Ambulation/Gait assistance: Min assist;Min guard Gait Distance (Feet): 325 Feet Assistive device: Rolling walker (2 wheeled) Gait Pattern/deviations: Step-through pattern;Decreased stride length;Shuffle;Decreased step length - right;Decreased step length - left;Trunk flexed;Narrow base of support (parkinsonian type pattern) Gait velocity: reduced Gait velocity interpretation: >2.62 ft/sec, indicative of community ambulatory General Gait Details: Pt with increased bil step length this date when navigating straight in wide hallways, but returned to shuffling gait when trying to avoid obstacles, walking through tight spaces, or turning. Pt cued to inc gait speed with success. Educated pt to move RW then feet and repeat when turning, but pt displaying increased difficulty stopping and re-initiating to manage RW then feet (parkinsons) thus cued pt to inctead keep moving with RW during turns but inc stpe length, mod success.   Stairs             Wheelchair Mobility    Modified Rankin (Stroke Patients Only) Modified Rankin (Stroke Patients Only) Pre-Morbid Rankin Score: Moderate disability Modified Rankin: Moderately severe disability     Balance Overall balance assessment: Mild deficits observed, not formally tested           Standing balance-Leahy Scale: Poor Standing balance comment: bilat UE support on walker                            Cognition Arousal/Alertness: Awake/alert Behavior During Therapy: WFL for tasks assessed/performed Overall Cognitive Status: Impaired/Different from baseline Area of Impairment: Memory;Problem solving  Memory: Decreased short-term memory       Problem Solving: Slow processing;Difficulty sequencing;Requires verbal cues;Requires tactile cues General Comments: Pt requires repeated cues for sequencing RW and feet with turns.      Exercises      General Comments         Pertinent Vitals/Pain Pain Assessment: Faces Faces Pain Scale: No hurt Pain Intervention(s): Monitored during session    Home Living                      Prior Function            PT Goals (current goals can now be found in the care plan section) Acute Rehab PT Goals Patient Stated Goal: to improve PT Goal Formulation: With patient Time For Goal Achievement: Aug 11, 2020 Potential to Achieve Goals: Good Progress towards PT goals: Progressing toward goals    Frequency    Min 3X/week      PT Plan Current plan remains appropriate    Co-evaluation              AM-PAC PT "6 Clicks" Mobility   Outcome Measure  Help needed turning from your back to your side while in a flat bed without using bedrails?: A Little Help needed moving from lying on your back to sitting on the side of a flat bed without using bedrails?: A Little Help needed moving to and from a bed to a chair (including a wheelchair)?: A Little Help needed standing up from a chair using your arms (e.g., wheelchair or bedside chair)?: A Little Help needed to walk in hospital room?: A Little Help needed climbing 3-5 steps with a railing? : A Lot 6 Click Score: 17    End of Session Equipment Utilized During Treatment: Gait belt Activity Tolerance: Patient tolerated treatment well Patient left: in chair;with call bell/phone within reach;with chair alarm set;with family/visitor present   PT Visit Diagnosis: Unsteadiness on feet (R26.81);Difficulty in walking, not elsewhere classified (R26.2);Other abnormalities of gait and mobility (R26.89);History of falling (Z91.81);Other symptoms and signs involving the nervous system (R29.898)     Time: 0174-9449 PT Time Calculation (min) (ACUTE ONLY): 25 min  Charges:  $Gait Training: 23-37 mins                     Moishe Spice, PT, DPT Acute Rehabilitation Services  Pager: [phone] Office: [place] 07/22/2020, 12:28 PM

## 2020-07-22 NOTE — TOC Progression Note (Signed)
Transition of Care [facility]) - Progression Note    Patient Details  Name: Justin Gomez MRN: [id number] Date of Birth: 02/21/1941  [facility] CM/SW [place], [place] Phone Number: 07/22/2020, 2:30 PM  Clinical Narrative:   CSW met with patient and wife at bedside to provide bed offers for SNF. Patient and wife to review, and CSW to follow back up with them on choice. CSW contacted patient's insurance provider to initiate insurance authorization request. CSW to follow.    Expected Discharge Plan: [place] Barriers to Discharge: Continued Medical Work up  Expected Discharge Plan and Services Expected Discharge Plan: [place] arrangements for the past 2 months: Single Family Home                                       Social Determinants of Health (SDOH) Interventions    Readmission Risk Interventions No flowsheet data found.

## 2020-07-22 NOTE — Progress Notes (Signed)
PROGRESS NOTE    OCIE TINO  IHW:[id number] DOB: 10/28/1940 DOA: 07/18/2020 PCP: Jani Gravel, MD   Brief Narrative:  Justin Gomez is a 80 y.o. male with medical history significant of CVA, hypertension, GERD, Parkinson's disease, postural hypotension,'s diabetes, hypothyroidism, recent admission for subdural hematoma who presents with some intermittent confusion and difficulty speaking for the past 2 days. Patient was recently admitted from 1/21-1/22 after he had a fall with note subdural hematoma and associated slurred speech - repeat imaging negative and subsequently discharged home on keppra due to questionable seizure like activity. About 48h after starting keppra/returning home patient had increased intermittent confusion and difficulty speaking. In the ED: CT head showed a slight decrease of subdural hematoma at the left frontal parietal region which as above is decreased from 1/22.  There is a new thin acute subdural hematoma at the left tentorium which is consistent with an extension of the previous bleed.  No new/acute bleed suspected.  Neurosurgery consulted by EDP who states that due to this being an extension and not significant in size there is no indication for operative intervention, some irritation may be causing his waxing and waning symptoms.  Neurology was consulted as well, TRH requested to admit.  Assessment & Plan:   Principal Problem:   Intracranial bleeding (HCC) Active Problems:   Essential hypertension   Type 2 diabetes mellitus without complication, without long-term current use of insulin (HCC)   Gastroesophageal reflux disease   Postural hypotension   Cerebral thrombosis with cerebral infarction   Parkinson's disease   SDH (subdural hematoma) (HCC)   ICH (intracerebral hemorrhage) (HCC)   Intracranial bleeding with recent subdural hematoma Acute onset delirium/behavioral changes/encephalopathy, resolving Rule out atypical seizure/todds paralysis Rule  out polypharmacy - At baseline per wife prior to fall and brain bleed- no speech deficits; standup/walked without assistance; minimal help with dressing; AOx4 with 'sharp mind' but occasionally became confused or misplaced items frequently - Patient's mental status is improving drastically compared to admission, he is much more awake alert, cooperative.  Waxing and waning speech but improved today - Neurosurgery consulted in ED who states this is a nonoperative case due to minimal bleeding and expected extension on CT head - Neurology following, EEG shows diffuse encephalopathy without epileptiform discharges, appreciate their evaluation today with patient's worsening speech patterns - discontinued keppra 07/21/20 and started depakote -Strength intact and equal bilaterally, no other deficits noted other than speech   Chronic Parkinson's disease - Continue home Sinemet  Urinary frequency and incotninence - Continue Myrbetriq and finasteride - Place external urinary catheter  Diabetes - Continue Metformin once able to take p.o. safely - CBG monitoring  Hypertension - Continue home losartan once able to take p.o. safely - Continue as needed medications in the meantime IV  History of CVA - Continue home atorvastatin once able to take p.o. safely  GERD - Continue home PPI  Hypothyroidism - Continue home Synthroid once able to take p.o. safely  DVT prophylaxis: SCDs Code Status: Full Family Communication: Wife updated - discussed patient's prognosis, current situation disposition and treatment.  Status is: Inpatient  Dispo: The patient is from: Home              Anticipated d/c is to: To be determined              Anticipated d/c date is: 48 to 72 hours              Patient currently not medically stable for  discharge  Consultants:   Neurology, neurosurgery  Procedures:   None indicated  Antimicrobials:  None indicated  Subjective: No acute issues or events  overnight, speech markedly improved today, patient denies headache, vision changes, weakness, nausea, vomiting, diarrhea, constipation, fevers, chills.  Objective: Vitals:   07/21/20 1513 07/21/20 1952 07/21/20 2331 07/22/20 0332  BP: (!) 107/53 134/68 (!) 153/78 (!) 146/73  Pulse: 81 81 74 78  Resp: 16 16 17 18   Temp: 97.6 F (36.4 C) 97.9 F (36.6 C) (!) 97.5 F (36.4 C) 97.9 F (36.6 C)  TempSrc: Oral Oral Oral Oral  SpO2: 100% 97% 98% 95%    Intake/Output Summary (Last 24 hours) at 07/22/2020 9518 Last data filed at 07/22/2020 0400 Gross per 24 hour  Intake 801.31 ml  Output 1050 ml  Net -248.69 ml   There were no vitals filed for this visit.  Examination:  General: Pleasantly resting in bed, awake alert, oriented x4 HEENT:  Normocephalic atraumatic.  Sclerae nonicteric, noninjected.  Extraocular movements intact bilaterally. Neck:  Without mass or deformity.  Trachea is midline. Lungs:  Clear to auscultate bilaterally without rhonchi, wheeze, or rales. Heart:  Regular rate and rhythm.  Without murmurs, rubs, or gallops. Abdomen:  Soft, nontender, nondistended.  Without guarding or rebound. Extremities: Without cyanosis, clubbing, edema, or obvious deformity/weakness. Vascular:  Dorsalis pedis and posterior tibial pulses palpable bilaterally. Skin:  Warm and dry, no erythema, no ulcerations.  Data Reviewed: I have personally reviewed following labs and imaging studies  CBC: Recent Labs  Lab 07/18/20 1136 07/19/20 0333 07/20/20 0335 07/21/20 0423 07/22/20 0344  WBC 7.0 10.2 7.9 6.6 5.1  NEUTROABS 5.5  --   --   --   --   HGB 11.8* 12.0* 11.3* 10.9* 11.3*  HCT 36.3* 35.5* 34.2* 33.3* 32.4*  MCV 93.1 92.9 91.0 91.5 89.5  PLT 165 185 190 167 841   Basic Metabolic Panel: Recent Labs  Lab 07/18/20 1136 07/19/20 0333 07/20/20 0335 07/21/20 0423 07/22/20 0344  NA 138 140 140 140 142  K 4.1 3.8 3.8 3.5 3.5  CL 102 103 104 105 103  CO2 26 24 24 26 28   GLUCOSE  124* 100* 88 111* 111*  BUN 20 18 16 17 18   CREATININE 0.79 0.74 0.84 0.65 0.86  CALCIUM 9.1 9.2 9.0 8.9 8.8*   GFR: Estimated Creatinine Clearance: 71.2 mL/min (by C-G formula based on SCr of 0.86 mg/dL). Liver Function Tests: Recent Labs  Lab 07/18/20 1136 07/19/20 0333 07/20/20 0335 07/21/20 0423 07/22/20 0344  AST 13* 15 15 16 15   ALT 5 6 18 6 8   ALKPHOS 67 65 65 49 53  BILITOT 1.1 1.3* 1.6* 0.6 0.8  PROT 6.3* 6.7 6.1* 5.5* 5.6*  ALBUMIN 3.6 3.7 3.4* 3.0* 2.9*   No results for input(s): LIPASE, AMYLASE in the last 168 hours. No results for input(s): AMMONIA in the last 168 hours. Coagulation Profile: Recent Labs  Lab 07/18/20 1136  INR 1.2   Cardiac Enzymes: No results for input(s): CKTOTAL, CKMB, CKMBINDEX, TROPONINI in the last 168 hours. BNP (last 3 results) No results for input(s): PROBNP in the last 8760 hours. HbA1C: No results for input(s): HGBA1C in the last 72 hours. CBG: Recent Labs  Lab 07/21/20 1033 07/21/20 2139  GLUCAP 141* 147*   Lipid Profile: No results for input(s): CHOL, HDL, LDLCALC, TRIG, CHOLHDL, LDLDIRECT in the last 72 hours. Thyroid Function Tests: No results for input(s): TSH, T4TOTAL, FREET4, T3FREE, THYROIDAB in the last 72  hours. Anemia Panel: No results for input(s): VITAMINB12, FOLATE, FERRITIN, TIBC, IRON, RETICCTPCT in the last 72 hours. Sepsis Labs: No results for input(s): PROCALCITON, LATICACIDVEN in the last 168 hours.  Recent Results (from the past 240 hour(s))  SARS CORONAVIRUS 2 (TAT 6-24 HRS) Nasopharyngeal Nasopharyngeal Swab     Status: None   Collection Time: 07/18/20  4:53 PM   Specimen: Nasopharyngeal Swab  Result Value Ref Range Status   SARS Coronavirus 2 NEGATIVE NEGATIVE Final    Comment: (NOTE) SARS-CoV-2 target nucleic acids are NOT DETECTED.  The SARS-CoV-2 RNA is generally detectable in upper and lower respiratory specimens during the acute phase of infection. Negative results do not preclude  SARS-CoV-2 infection, do not rule out co-infections with other pathogens, and should not be used as the sole basis for treatment or other patient management decisions. Negative results must be combined with clinical observations, patient history, and epidemiological information. The expected result is Negative.  Fact Sheet for Patients: SugarRoll.be  Fact Sheet for Healthcare Providers: https://www.woods-mathews.com/  This test is not yet approved or cleared by the [place] FDA and  has been authorized for detection and/or diagnosis of SARS-CoV-2 by FDA under an Emergency Use Authorization (EUA). This EUA will remain  in effect (meaning this test can be used) for the duration of the COVID-19 declaration under Se ction 564(b)(1) of the Act, 21 U.S.C. section 360bbb-3(b)(1), unless the authorization is terminated or revoked sooner.  Performed at [facility], [place] [address] [place], [place] [address]     Radiology Studies: CT HEAD WO CONTRAST  Result Date: 07/21/2020 CLINICAL DATA:  Acute neuro deficit. Right facial droop. History of subdural hematoma. EXAM: CT HEAD WITHOUT CONTRAST TECHNIQUE: Contiguous axial images were obtained from the base of the skull through the vertex without intravenous contrast. COMPARISON:  07/18/2020 FINDINGS: Brain: Small persistent but fading left-sided subdural hematoma with maximum diameter of 5 mm. This previously 7.5 mm. Stable subacute subdural fluid more anteriorly in the left frontal area. Stable subdural blood or layering subarachnoid blood along the left tentorium. No progressive findings. No findings for new/acute hemorrhage when compared to the prior study. The ventricles are in the midline without mass effect or shift. Stable age related cerebral atrophy, ventriculomegaly and periventricular white matter disease. Vascular: Stable vascular calcifications. No aneurysm or hyperdense vessels. Skull: No  skull fracture or bone lesions. Sinuses/Orbits: The paranasal sinuses and mastoid air cells are clear. The globes are intact. Other: No scalp lesions or hematoma. IMPRESSION: 1. Slight interval decrease in size of the left-sided subdural hematoma. 2. Stable subacute subdural fluid more anteriorly in the left frontal area. 3. Stable subdural blood or layering subarachnoid blood along the left tentorium. 4. Stable age related cerebral atrophy, ventriculomegaly and periventricular white matter disease. Electronically Signed   By: Marijo Sanes M.D.   On: 07/21/2020 13:47   Scheduled Meds: . atorvastatin  20 mg Oral q1800  . carbidopa-levodopa  2 tablet Oral TID  . finasteride  5 mg Oral Daily  . levothyroxine  75 mcg Oral Daily  . losartan  25 mg Oral Daily  . metFORMIN  750 mg Oral BID  . mirabegron ER  50 mg Oral Daily  . pantoprazole  40 mg Oral Daily  . sodium chloride flush  3 mL Intravenous Q12H  . valproic acid  500 mg Oral BID   Continuous Infusions: . lactated ringers 50 mL/hr at 07/22/20 0400     LOS: 3 days   Time  spent: 40 minutes  Little Ishikawa, DO [facility]  If 7PM-7AM, please contact night-coverage [email or web address]  07/22/2020, 7:42 AM

## 2020-07-22 NOTE — Care Management Important Message (Signed)
Important Message  Patient Details  Name: Justin Gomez MRN: [id number] Date of Birth: 1940/08/26   Medicare Important Message Given:  Yes     Orbie Pyo 07/22/2020, 2:30 PM

## 2020-07-22 NOTE — Progress Notes (Signed)
Modified Barium Swallow Progress Note  Patient Details  Name: BROWNIE NEHME MRN: [id number] Date of Birth: 11-Mar-1941  Today's Date: 07/22/2020  Modified Barium Swallow completed.  Full report located under Chart Review in the Imaging Section.  Brief recommendations include the following:  Clinical Impression  Pt presents with an acute on chronic dysphagia, suspected to be exacerbated by recent hospital stays and in setting of PD. Mild oral dysphagia was exhbited and moderate to severe pharyngeal dysphagia was noted. Oral deficits included delayed AP transit, mild oral residuals, and reduced bolus cohesion with mechanical soft textures.  Global pharyngeal deficits included reduced base of tongue retraction, poor epiglottic deflection (likely impacted by protrusion of posterior pharyngeal wall from cervical osteophyte), reduced laryngeal elevation, poor UES opening, reduced laryngeal vestibule and glottic closure, and diminished sensation.  This allowed for frequent pre and during the swallow penetration of ALL consistencies tested with episodic aspiration. Gross silent aspiration noted with thins via straw use. Smaller amounts of aspiration noted with nectar and honey thick liquids from post swallow residuals. Residual amounts noted with all PO, moderate in the valleculae and pyriform sinuses. Pt dysphagia has progressed since last objective swallow study in 06/2019. SLP discussed with pt and spouse, notified MD. Aspiration risk cannot be eliminated with PO diet. Pt and spouse wish to continue PO diet with known risk and use of safe swallowing strategies, oropharyngeal rehabilitation with SLP, and diligent oral care. Pt without recent PNA per family report. SLP to closely monitor. Recommend dysphagia 3 (mechanical soft) and thin liquids with no straws and medicines crushed in puree.     Swallow Evaluation Recommendations       SLP Diet Recommendations: Thin liquid;Dysphagia 3 (Mech soft)  solids   Liquid Administration via: Cup;No straw       Supervision: Staff to assist with self feeding;Full supervision/cueing for compensatory strategies   Compensations: Small sips/bites;Slow rate;Minimize environmental distractions;Clear throat after each swallow;Multiple dry swallows after each bite/sip;Effortful swallow   Postural Changes: Remain semi-upright after after feeds/meals (Comment);Seated upright at 90 degrees   Oral Care Recommendations: Oral care BID;Oral care before and after PO        Hayden Rasmussen MA, CCC-SLP Acute Rehabilitation Services   07/22/2020,12:07 PM

## 2020-07-22 NOTE — Progress Notes (Signed)
  Speech Language Pathology Treatment: Dysphagia  Patient Details Name: Justin Gomez MRN: [id number] DOB: 08-05-1940 Today's Date: 07/22/2020 Time: 8343-7357 SLP Time Calculation (min) (ACUTE ONLY): 25 min  Assessment / Plan / Recommendation Clinical Impression  Pt clinically appearing much improved with swallow function as compared to interaction Tuesday however s/sx of dysphagia still persist. With hx of Parkinson's, dysphagia, and silent aspiration on most recent MBSS 06/2019 recommend proceed with instrumental assessment prior to diet advancement. MBSS planned 10:30 am.     HPI HPI: 80 y.o. male with medical history significant of CVA, hypertension, GERD, Parkinson's disease, postural hypotension,'s diabetes, hypothyroidism, recent admission for subdural hematoma who presents with some intermittent confusion and difficulty speaking for the past 2 days. Patient was recently admitted from 1/21-1/22 after he had a fall and hit his head and was noted to have a subdural hematoma and associated slurred speech.  A repeat CT the following day was stable and there was no indication for surgical intervention and his slurred speech was improving so he was discharged home.  He was discharged on Keppra due to a 15-second seizure-like episode in the ED during that admission.  He has been taking all prescribed medications including Keppra at home. Repeat CT of head this admission showed There is now a thin amount of acute-appearing subdural hemorrhage seen overlying the LEFT tentorium, likely interval extension from the aforementioned hemorrhage overlying the LEFT frontoparietal lobe.      SLP Plan  MBS       Recommendations  Diet recommendations: Other(comment) (pending MBSS planned 10:30 am this date) Liquids provided via: Cup Medication Administration: Crushed with puree Supervision: Staff to assist with self feeding;Full supervision/cueing for compensatory strategies Compensations: Small  sips/bites;Multiple dry swallows after each bite/sip;Effortful swallow Postural Changes and/or Swallow Maneuvers: Upright 30-60 min after meal;Seated upright 90 degrees                Oral Care Recommendations: Oral care BID Follow up Recommendations: 24 hour supervision/assistance SLP Visit Diagnosis: Dysphagia, unspecified (R13.10);Dysphagia, oropharyngeal phase (R13.12) Plan: MBS       GO               Reizy Dunlow H. MA, CCC-SLP Acute Rehabilitation Services   07/22/2020, 9:20 AM

## 2020-07-22 NOTE — Plan of Care (Signed)
Neurology Plan of Care Note Subjective: No acute events overnight.  Vitals:   07/22/20 0332 07/22/20 0758  BP: (!) 146/73 138/75  Pulse: 78 88  Resp: 18 18  Temp: 97.9 F (36.6 C) 97.8 F (36.6 C)  SpO2: 95% 96%   Objective: Patient is awake, calm, cooperative, no notable facial droop present on brief assessment as patient was being transported off of the floor. Patient is in no acute distress.  07/21/20 CT head personally reviewed IMPRESSION: 1. Slight interval decrease in size of the left-sided subdural hematoma. 2. Stable subacute subdural fluid more anteriorly in the left frontal area. 3. Stable subdural blood or layering subarachnoid blood along the left tentorium. 4. Stable age related cerebral atrophy, ventriculomegaly and periventricular white matter disease.   Recommendations: - Yesterday patient with stuttering neurologic signs likely from initial injury; fluctuating severity of aphasia and transient right facial droop but with resolution on Neurology assessment; CT head obtained with stable/improved findings. -  Nursing notes with evidence of patient combative behavior on arrival to ED requiring Geodon; patient on Keppra at home for seizure, recommended discontinuation of Keppra and initiation of Depakote yesterday 07/21/2020.  - Recommend continuing Depakote 500 mg BID at discharge with follow up with neurology in 4-6 weeks for further management of AED.  - Neurology without further inpatient recommendations at this time, low suspicion for ischemia due to negative imaging, prior cerebral insult, and resolution/stuttering signs and symptoms consistent with initial injury.  - Thank you for allowing [place] to participate in this patient's care. Please reach out for further questions or concerns.   Anibal Henderson, AGACNP-BC [facility]  [phone]

## 2020-07-23 ENCOUNTER — Ambulatory Visit: Payer: HMO | Admitting: Physical Therapy

## 2020-07-23 DIAGNOSIS — I10 Essential (primary) hypertension: Secondary | ICD-10-CM | POA: Diagnosis not present

## 2020-07-23 LAB — COMPREHENSIVE METABOLIC PANEL
ALT: 7 U/L (ref 0–44)
AST: 14 U/L — ABNORMAL LOW (ref 15–41)
Albumin: 3 g/dL — ABNORMAL LOW (ref 3.5–5.0)
Alkaline Phosphatase: 53 U/L (ref 38–126)
Anion gap: 12 (ref 5–15)
BUN: 13 mg/dL (ref 8–23)
CO2: 27 mmol/L (ref 22–32)
Calcium: 8.8 mg/dL — ABNORMAL LOW (ref 8.9–10.3)
Chloride: 101 mmol/L (ref 98–111)
Creatinine, Ser: 0.7 mg/dL (ref 0.61–1.24)
GFR, Estimated: >60 mL/min (ref >60)
Glucose, Bld: 113 mg/dL — ABNORMAL HIGH (ref 70–99)
Potassium: 3.5 mmol/L (ref 3.5–5.1)
Sodium: 140 mmol/L (ref 135–145)
Total Bilirubin: 1 mg/dL (ref 0.3–1.2)
Total Protein: 5.6 g/dL — ABNORMAL LOW (ref 6.5–8.1)

## 2020-07-23 LAB — CBC
HCT: 34.3 % — ABNORMAL LOW (ref 39.0–52.0)
Hemoglobin: 11.7 g/dL — ABNORMAL LOW (ref 13.0–17.0)
MCH: 30.5 pg (ref 26.0–34.0)
MCHC: 34.1 g/dL (ref 30.0–36.0)
MCV: 89.6 fL (ref 80.0–100.0)
Platelets: 184 10*3/uL (ref 150–400)
RBC: 3.83 MIL/uL — ABNORMAL LOW (ref 4.22–5.81)
RDW: 13 % (ref 11.5–15.5)
WBC: 11.3 10*3/uL — ABNORMAL HIGH (ref 4.0–10.5)
nRBC: 0 % (ref 0.0–0.2)

## 2020-07-23 MED ORDER — PANTOPRAZOLE SODIUM 40 MG PO PACK
40.0000 mg | PACK | Freq: Every day | ORAL | Status: DC
  Administered 2020-07-24 – 2020-07-27 (×4): 40 mg via ORAL
  Filled 2020-07-23 (×4): qty 20

## 2020-07-23 MED ORDER — METFORMIN HCL 500 MG PO TABS
750.0000 mg | ORAL_TABLET | Freq: Two times a day (BID) | ORAL | Status: DC
  Administered 2020-07-23 – 2020-07-24 (×4): 750 mg via ORAL
  Filled 2020-07-23 (×4): qty 2

## 2020-07-23 MED ORDER — DIVALPROEX SODIUM 125 MG PO CSDR
500.0000 mg | DELAYED_RELEASE_CAPSULE | Freq: Two times a day (BID) | ORAL | Status: DC
  Administered 2020-07-23 – 2020-07-27 (×9): 500 mg via ORAL
  Filled 2020-07-23 (×9): qty 4

## 2020-07-23 NOTE — Progress Notes (Signed)
Pharmacy and MD notified that patient takes medications crushed. Per pharmacy Metformin ER, and Mirabegron ER cannot be crushed. Patient also cannot take Valproic acid capsules, needs sprinkles

## 2020-07-23 NOTE — TOC Progression Note (Signed)
Transition of Care [facility]) - Progression Note    Patient Details  Name: Justin Gomez MRN: [id number] Date of Birth: May 05, 1941  Transition of Care [facility]) CM/SW [place], [place] Phone Number: 07/23/2020, 3:21 PM  Clinical Narrative:   CSW worked towards SNF placement throughout the day today, coordinating with medical team. PT worked with patient, who is not at his baseline with mobility. MD completed peer to peer with insurance, and patient has been approved for SNF placement, per MD. CSW still awaiting call with approval details. Patient and wife agreeable to SNF, has chosen bed offer at [facility]. [facility] unable to accept the patient until Monday. CSW updated MD and the patient and his wife on discharge plan. CSW to follow.    Expected Discharge Plan: [place] Barriers to Discharge: Continued Medical Work up  Expected Discharge Plan and Services Expected Discharge Plan: [place] Choice: [place] arrangements for the past 2 months: Single Family Home                                       Social Determinants of Health (SDOH) Interventions    Readmission Risk Interventions No flowsheet data found.

## 2020-07-23 NOTE — Progress Notes (Signed)
  Speech Language Pathology Treatment: Dysphagia  Patient Details Name: Justin Gomez MRN: [id number] DOB: [birth year] Today's Date: 07/23/2020 Time: 9371-6967 SLP Time Calculation (min) (ACUTE ONLY): 39 min  Assessment / Plan / Recommendation Clinical Impression  Upon entering room, Mr. Foti presented with unremitting cough.  He had just taken his meds per Mrs. Vargus, and he coughed for ten minutes without relief, producing minimal expectorations. Cough was weak, wet, with audible secretions interfering with speech clarity. Mr. Dugdale stated adamantly that he wanted to swallow his pills whole without being crushed. [facility] from [facility] was retrieved on computer in room, and video of pt's swallowing was reviewed.  Reiterated the acute on chronic nature of his dysphagia, the aspiration that was observed pre-, during, and post-swallow regardless of consistency; moderate residuals in throat that persisted throughout exam. We discussed the dysphagia that accompanies PD and the rapid deterioration that can occur during acute medical crises.  I also spoke with Mrs. Ala about the benefits of a palliative care consult to help make decisions about nutrition and overall quality of life as the Parkinson's progresses. She agreed a consult may be helpful in information-gathering.    Respiratory muscle trainer was provided, with resistance set at 13 after adjusting for pt's self-rating of exertion.  With  visual/verbal cues provided, pt completed four sets of five.  Mrs. Ryans was shown how to help her husband use the trainer, and we discussed its use as another therapeutic tool to assist with respiratory support, cough strength. She verbalized understanding.  SLP will follow for swallowing education and therapy.   HPI HPI: 80 y.o. male with medical history significant of CVA, hypertension, GERD, Parkinson's disease, postural hypotension,'s diabetes, hypothyroidism, recent admission for subdural hematoma who  presents with some intermittent confusion and difficulty speaking for the past 2 days. Patient was recently admitted from 1/21-1/22 after he had a fall and hit his head and was noted to have a subdural hematoma and associated slurred speech.  A repeat CT the following day was stable and there was no indication for surgical intervention and his slurred speech was improving so he was discharged home.  He was discharged on Keppra due to a 15-second seizure-like episode in the ED during that admission.  He has been taking all prescribed medications including Keppra at home. Repeat CT of head this admission showed There is now a thin amount of acute-appearing subdural hemorrhage seen overlying the LEFT tentorium, likely interval extension from the aforementioned hemorrhage overlying the LEFT frontoparietal lobe.      SLP Plan  Continue with current plan of care       Recommendations  Diet recommendations: Dysphagia 3 (mechanical soft);Thin liquid Liquids provided via: Cup Medication Administration: Crushed with puree Supervision: Staff to assist with self feeding;Full supervision/cueing for compensatory strategies Compensations: Small sips/bites;Slow rate;Minimize environmental distractions;Clear throat after each swallow;Multiple dry swallows after each bite/sip;Effortful swallow Postural Changes and/or Swallow Maneuvers: Upright 30-60 min after meal;Seated upright 90 degrees                Oral Care Recommendations: Oral care BID Follow up Recommendations: Skilled Nursing facility SLP Visit Diagnosis: Dysphagia, oropharyngeal phase (R13.12) Plan: Continue with current plan of care       GO              Lareen Mullings L. Tivis Ringer, [place] Office number [phone] Pager [phone]   Juan Quam Laurice 07/23/2020, 4:09 PM

## 2020-07-23 NOTE — Plan of Care (Signed)

## 2020-07-23 NOTE — Progress Notes (Signed)
Physical Therapy Treatment Patient Details Name: Justin Gomez MRN: [id number] DOB: 1941/05/10 Today's Date: 07/23/2020    History of Present Illness Justin Gomez is a 80 y.o. male with medical history significant of CVA, hypertension, GERD, Parkinson's disease, postural hypotension,'s diabetes, hypothyroidism, recent admission 1-21-1/22 for subdural hematoma s/p fall who presents with some intermittent confusion and difficulty speaking for the past 2 days. Pt found to have new thin acute subdural hematoma at L tentorium.    PT Comments    Focused session on stair negotiation as he would need to negotiate an entire flight of stairs to enter/exit his home. PTA, he was able to navigate stairs with supervision. Currently, he needed minA to ascend and maxA to descend only 2 stairs with 1 hand rail and 1 HHA. He is at high risk for falls and injury and would be unable to exit his home in case of emergency with level of assistance his wife is able to provide. Pt would therefore greatly benefit from skilled PT at a SNF prior to return home to increase his independence and safety and thereby his quality of life and decrease his burden of care. Will continue to follow acutely.   Follow Up Recommendations  SNF;Supervision/Assistance - 24 hour (pt wants home but wife wants SNF)     Equipment Recommendations  None recommended by PT    Recommendations for Other Services       Precautions / Restrictions Precautions Precautions: Fall Precaution Comments: seizure precautions Restrictions Weight Bearing Restrictions: No    Mobility  Bed Mobility Overal bed mobility: Needs Assistance Bed Mobility: Supine to Sit     Supine to sit: Mod assist     General bed mobility comments: Pt sitting up in recliner upon arrival.  Transfers Overall transfer level: Needs assistance Equipment used: Rolling walker (2 wheeled) Transfers: Sit to/from Stand Sit to Stand: Min assist         General  transfer comment: max verbal cues for safe hand placement as pt trying to pull up on walker, once corrected pt able to power up to stand with minA and extra time. Poor peripheral vision with max cues to place hands on recliner arm rests.  Ambulation/Gait Ambulation/Gait assistance: Min assist;Min guard Gait Distance (Feet): 100 Feet Assistive device: Rolling walker (2 wheeled) Gait Pattern/deviations: Step-through pattern;Decreased stride length;Shuffle;Decreased step length - right;Decreased step length - left;Trunk flexed;Narrow base of support (parkinsonian type pattern) Gait velocity: reduced Gait velocity interpretation: 1.31 - 2.62 ft/sec, indicative of limited community ambulator General Gait Details: Pt with increased bil step length this date when navigating straight in wide hallways, but returned to shuffling gait when trying to avoid obstacles, walking through tight spaces, or turning.   Stairs Stairs: Yes Stairs assistance: Max assist Stair Management: One rail Left;One rail Right;Alternating pattern;Step to pattern Number of Stairs: 2 General stair comments: Ascending with L rail and R HHA and descending with R rail and L HHA. reciprocal ascenduing but step-to descending. MinA to ascend with cues to get proximal to step before starting, needing cues for placing entire foot on step. Mod-maxA to descend due to poor coordination and ability to clear heel.   Wheelchair Mobility    Modified Rankin (Stroke Patients Only) Modified Rankin (Stroke Patients Only) Pre-Morbid Rankin Score: Moderate disability Modified Rankin: Moderately severe disability     Balance Overall balance assessment: Mild deficits observed, not formally tested Sitting-balance support: Feet supported;No upper extremity supported Sitting balance-Leahy Scale: Good  Standing balance-Leahy Scale: Poor Standing balance comment: bilat UE support on walker                             Cognition Arousal/Alertness: Awake/alert Behavior During Therapy: WFL for tasks assessed/performed Overall Cognitive Status: Impaired/Different from baseline Area of Impairment: Memory;Problem solving                     Memory: Decreased short-term memory       Problem Solving: Slow processing;Difficulty sequencing;Requires verbal cues;Requires tactile cues General Comments: Pt requires repeated cues for sequencing RW and feet with turns and stairs. Decreased awareness of peripheral.      Exercises      General Comments General comments (skin integrity, edema, etc.): Patient lethargic at conclusion of treatment session.      Pertinent Vitals/Pain Pain Assessment: Faces Faces Pain Scale: No hurt Pain Intervention(s): Monitored during session    Home Living                      Prior Function            PT Goals (current goals can now be found in the care plan section) Acute Rehab PT Goals Patient Stated Goal: to improve PT Goal Formulation: With patient Time For Goal Achievement: 2020/08/22 Potential to Achieve Goals: Good Progress towards PT goals: Progressing toward goals    Frequency    Min 3X/week      PT Plan Current plan remains appropriate    Co-evaluation              AM-PAC PT "6 Clicks" Mobility   Outcome Measure  Help needed turning from your back to your side while in a flat bed without using bedrails?: A Little Help needed moving from lying on your back to sitting on the side of a flat bed without using bedrails?: A Little Help needed moving to and from a bed to a chair (including a wheelchair)?: A Little Help needed standing up from a chair using your arms (e.g., wheelchair or bedside chair)?: A Little Help needed to walk in hospital room?: A Little Help needed climbing 3-5 steps with a railing? : A Lot 6 Click Score: 17    End of Session Equipment Utilized During Treatment: Gait belt Activity Tolerance: Patient  tolerated treatment well Patient left: in chair;with call bell/phone within reach;with chair alarm set;with family/visitor present Nurse Communication: Mobility status PT Visit Diagnosis: Unsteadiness on feet (R26.81);Difficulty in walking, not elsewhere classified (R26.2);Other abnormalities of gait and mobility (R26.89);History of falling (Z91.81);Other symptoms and signs involving the nervous system (R29.898)     Time: 1305-1330 PT Time Calculation (min) (ACUTE ONLY): 25 min  Charges:  $Gait Training: 23-37 mins                     Moishe Spice, PT, DPT Acute Rehabilitation Services  Pager: [phone] Office: [place] 07/23/2020, 1:38 PM

## 2020-07-23 NOTE — Progress Notes (Signed)
Occupational Therapy Treatment Patient Details Name: Justin Gomez MRN: [id number] DOB: 06-09-1941 Today's Date: 07/23/2020    History of present illness Justin Gomez is a 80 y.o. male with medical history significant of CVA, hypertension, GERD, Parkinson's disease, postural hypotension,'s diabetes, hypothyroidism, recent admission 1-21-1/22 for subdural hematoma s/p fall who presents with some intermittent confusion and difficulty speaking for the past 2 days. Pt found to have new thin acute subdural hematoma at L tentorium.   OT comments  OT treatment session with focus on self-care re-education, ADL transfers, safety awareness, and household mobility with use of RW. Patient currently requiring Min A for ADL transfers, Min guard to Min A for household mobility with RW, Min A with UB ADLs and Mod A for LB ADLs and toileting. If patient were to d/c home with wife, she would need to be able to provide this level of assist. Patient continues to be limited by decreased strength, decreased standing balance with high fall risk, decreased safety awareness, and decreased cognition. Patient would benefit from continued acute OT services to maximize safety and independence with self-care tasks and decrease caregiver burden. If patient is to return home, recommendation for family education session prior to d/c.    Follow Up Recommendations  SNF;Home health OT;Supervision/Assistance - 24 hour    Equipment Recommendations  3 in 1 bedside commode    Recommendations for Other Services      Precautions / Restrictions Precautions Precautions: Fall Precaution Comments: seizure precautions Restrictions Weight Bearing Restrictions: No       Mobility Bed Mobility Overal bed mobility: Needs Assistance Bed Mobility: Supine to Sit     Supine to sit: Mod assist     General bed mobility comments: Patient able to advance BLE toward EOB but required Mod A to elevate trunk. HOB elevated to 40  degrees.  Transfers Overall transfer level: Needs assistance Equipment used: Rolling walker (2 wheeled) Transfers: Sit to/from Stand Sit to Stand: Min assist         General transfer comment: Cues for hand placement and Min A for power up.    Balance Overall balance assessment: Mild deficits observed, not formally tested Sitting-balance support: Feet supported;No upper extremity supported Sitting balance-Leahy Scale: Good       Standing balance-Leahy Scale: Poor Standing balance comment: Min A to maintain static standing at sink level during ADLs.                           ADL either performed or assessed with clinical judgement   ADL Overall ADL's : Needs assistance/impaired     Grooming: Minimal assistance;Standing Grooming Details (indicate cue type and reason): 3/3 parts of grooming tasks standing at sink level with Min A to maintain standing balance.             Lower Body Dressing: Sit to/from stand;Moderate assistance Lower Body Dressing Details (indicate cue type and reason): Mod A to thread BLE and hike brief over hips in standing. Toilet Transfer: Minimal Print production planner Details (indicate cue type and reason): Simulated. Multimodal cues for hand placement and proximity to chair.         Functional mobility during ADLs: Minimal assistance General ADL Comments: Min A for short-distance functional mobility in room with RW. Shuffle-like gait noted.     Vision   Vision Assessment?: No apparent visual deficits   Perception     Praxis      Cognition Arousal/Alertness: Awake/alert  Behavior During Therapy: WFL for tasks assessed/performed Overall Cognitive Status: Impaired/Different from baseline Area of Impairment: Memory                     Memory: Decreased short-term memory       Problem Solving: Slow processing;Difficulty sequencing;Requires verbal cues;Requires tactile cues General Comments: Patient able to  sequence familiar ADL tasks without cues. Decreased STM noted.        Exercises     Shoulder Instructions       General Comments Patient lethargic at conclusion of treatment session.    Pertinent Vitals/ Pain       Pain Assessment: No/denies pain  Home Living                                          Prior Functioning/Environment              Frequency  Min 2X/week        Progress Toward Goals  OT Goals(current goals can now be found in the care plan section)  Progress towards OT goals: Progressing toward goals  Acute Rehab OT Goals Patient Stated Goal: To return home. OT Goal Formulation: With patient Time For Goal Achievement: 08/04/20 Potential to Achieve Goals: Good ADL Goals Pt Will Perform Eating: Independently;sitting Pt Will Perform Grooming: with supervision;standing Pt Will Perform Upper Body Dressing: with supervision;sitting Pt Will Perform Lower Body Dressing: with supervision;sit to/from stand Pt Will Transfer to Toilet: with supervision;ambulating Pt Will Perform Toileting - Clothing Manipulation and hygiene: with supervision;sit to/from stand Pt/caregiver will Perform Home Exercise Program: Both right and left upper extremity;With written HEP provided;With Supervision  Plan Discharge plan remains appropriate;Frequency remains appropriate    Co-evaluation                 AM-PAC OT "6 Clicks" Daily Activity     Outcome Measure   Help from another person eating meals?: A Little (Purred diet and honey thick liquids) Help from another person taking care of personal grooming?: A Little Help from another person toileting, which includes using toliet, bedpan, or urinal?: A Little Help from another person bathing (including washing, rinsing, drying)?: A Lot Help from another person to put on and taking off regular upper body clothing?: A Little Help from another person to put on and taking off regular lower body clothing?: A  Lot 6 Click Score: 16    End of Session Equipment Utilized During Treatment: Gait belt  OT Visit Diagnosis: Unsteadiness on feet (R26.81);Other abnormalities of gait and mobility (R26.89);Muscle weakness (generalized) (M62.81)   Activity Tolerance Patient tolerated treatment well   Patient Left in chair;with call bell/phone within reach   Nurse Communication          Time: 1000-1030 OT Time Calculation (min): 30 min  Charges: OT General Charges $OT Visit: 1 Visit OT Treatments $Self Care/Home Management : 23-37 mins  Korie Streat H. OTR/L Supplemental OT, Department of rehab services [phone]   Tyrees Chopin R H. 07/23/2020, 11:36 AM

## 2020-07-23 NOTE — Progress Notes (Addendum)
PROGRESS NOTE    Justin Gomez  [id number] DOB: [birth year] DOA: 07/18/2020 PCP: Jani Gravel, MD   Brief Narrative:  Justin Gomez is a 80 y.o. male with medical history significant of CVA, hypertension, GERD, Parkinson's disease, postural hypotension,'s diabetes, hypothyroidism, recent admission for subdural hematoma who presents with some intermittent confusion and difficulty speaking for the past 2 days. Patient was recently admitted from 1/21-1/22 after he had a fall with note subdural hematoma and associated slurred speech - repeat imaging negative and subsequently discharged home on keppra due to questionable seizure like activity. About 48h after starting keppra/returning home patient had increased intermittent confusion and difficulty speaking. In the ED: CT head showed a slight decrease of subdural hematoma at the left frontal parietal region which as above is decreased from 1/22.  There is a new thin acute subdural hematoma at the left tentorium which is consistent with an extension of the previous bleed.  No new/acute bleed suspected.  Neurosurgery consulted by EDP who states that due to this being an extension and not significant in size there is no indication for operative intervention, some irritation may be causing his waxing and waning symptoms.  Neurology was consulted as well, TRH requested to admit.  Assessment & Plan:   Principal Problem:   Intracranial bleeding (HCC) Active Problems:   Essential hypertension   Type 2 diabetes mellitus without complication, without long-term current use of insulin (HCC)   Gastroesophageal reflux disease   Postural hypotension   Cerebral thrombosis with cerebral infarction   Parkinson's disease   SDH (subdural hematoma) (HCC)   ICH (intracerebral hemorrhage) (HCC)  Intracranial bleeding with recent subdural hematoma Acute onset delirium/behavioral changes/encephalopathy, resolving Rule out atypical seizure/todds paralysis Rule out  polypharmacy - At baseline per wife prior to fall and brain bleed- no speech deficits; standup/walked without assistance; minimal help with dressing; AOx4 with 'sharp mind' but occasionally became confused or misplaced items frequently - Patient's mental status is improving drastically compared to admission, he is much more awake alert, cooperative.  Waxing and waning speech but improved today - Neurology signing off, EEG shows diffuse encephalopathy without epileptiform discharges, appreciate their evaluation today with patient's worsening speech patterns - discontinued keppra 07/21/20 and started depakote 500 BID - outpatient follow up in 4-6 weeks   Chronic Parkinson's disease - Continue home Sinemet  Urinary frequency and incotninence - Continue finasteride, cannot continue Myrbetriq -cannot be crushed. - Place external urinary catheter  Diabetes - Continue Metformin -changed to non-XR formula so it can be crushed - CBG monitoring  Hypertension - Continue home losartan - Continue as needed medications in the meantime IV  History of CVA - Continue home atorvastatin once able to take p.o. safely  GERD - Continue home PPI  Hypothyroidism - Continue home Synthroid once able to take p.o. safely  DVT prophylaxis: SCDs Code Status: Full Family Communication: Wife updated - discussed patient's prognosis, current situation disposition and treatment.  Status is: Inpatient  Dispo: The patient is from: Home              Anticipated d/c is to: SNF              Anticipated d/c date is: 24-48              Patient currently IS medically stable for discharge  Consultants:   Neurology, neurosurgery  Procedures:   None indicated  Antimicrobials:  None indicated  Subjective: No acute issues or events overnight, speech somewhat still  slurred today, patient denies headache, vision changes, weakness, nausea, vomiting, diarrhea, constipation, fevers, chills.  Objective: Vitals:    07/22/20 0758 07/22/20 1235 07/22/20 2000 07/23/20 0024  BP: 138/75 111/79 (!) 151/69 (!) 150/66  Pulse: 88 71 78 80  Resp: 18 18 17 17   Temp: 97.8 F (36.6 C) 97.7 F (36.5 C) (!) 97.4 F (36.3 C) 97.9 F (36.6 C)  TempSrc: Oral Oral Oral Oral  SpO2: 96% 100% 98% 94%    Intake/Output Summary (Last 24 hours) at 07/23/2020 [id number] Last data filed at 07/22/2020 1242 Gross per 24 hour  Intake 261.18 ml  Output 551 ml  Net -289.82 ml   There were no vitals filed for this visit.  Examination:  General: Pleasantly resting in bed, awake alert, oriented x4 HEENT:  Normocephalic atraumatic.  Sclerae nonicteric, noninjected.  Extraocular movements intact bilaterally. Slurred speech ongoing but improved. Neck:  Without mass or deformity.  Trachea is midline. Lungs:  Clear to auscultate bilaterally without rhonchi, wheeze, or rales. Heart:  Regular rate and rhythm.  Without murmurs, rubs, or gallops. Abdomen:  Soft, nontender, nondistended.  Without guarding or rebound. Extremities: Without cyanosis, clubbing, edema, or obvious deformity/weakness. Vascular:  Dorsalis pedis and posterior tibial pulses palpable bilaterally. Skin:  Warm and dry, no erythema, no ulcerations.  Data Reviewed: I have personally reviewed following labs and imaging studies  CBC: Recent Labs  Lab 07/18/20 1136 07/19/20 0333 07/20/20 0335 07/21/20 0423 07/22/20 0344 07/23/20 0400  WBC 7.0 10.2 7.9 6.6 5.1 11.3*  NEUTROABS 5.5  --   --   --   --   --   HGB 11.8* 12.0* 11.3* 10.9* 11.3* 11.7*  HCT 36.3* 35.5* 34.2* 33.3* 32.4* 34.3*  MCV 93.1 92.9 91.0 91.5 89.5 89.6  PLT 165 185 190 167 175 [id number]   Basic Metabolic Panel: Recent Labs  Lab 07/19/20 0333 07/20/20 0335 07/21/20 0423 07/22/20 0344 07/23/20 0400  NA 140 140 140 142 140  K 3.8 3.8 3.5 3.5 3.5  CL 103 104 105 103 101  CO2 24 24 26 28 27   GLUCOSE 100* 88 111* 111* 113*  BUN 18 16 17 18 13   CREATININE 0.74 0.84 0.65 0.86 0.70  CALCIUM 9.2  9.0 8.9 8.8* 8.8*   GFR: Estimated Creatinine Clearance: 76.6 mL/min (by C-G formula based on SCr of 0.7 mg/dL). Liver Function Tests: Recent Labs  Lab 07/19/20 0333 07/20/20 0335 07/21/20 0423 07/22/20 0344 07/23/20 0400  AST 15 15 16 15  14*  ALT 6 18 6 8 7   ALKPHOS 65 65 49 53 53  BILITOT 1.3* 1.6* 0.6 0.8 1.0  PROT 6.7 6.1* 5.5* 5.6* 5.6*  ALBUMIN 3.7 3.4* 3.0* 2.9* 3.0*   No results for input(s): LIPASE, AMYLASE in the last 168 hours. No results for input(s): AMMONIA in the last 168 hours. Coagulation Profile: Recent Labs  Lab 07/18/20 1136  INR 1.2   Cardiac Enzymes: No results for input(s): CKTOTAL, CKMB, CKMBINDEX, TROPONINI in the last 168 hours. BNP (last 3 results) No results for input(s): PROBNP in the last 8760 hours. HbA1C: No results for input(s): HGBA1C in the last 72 hours. CBG: Recent Labs  Lab 07/21/20 1033 07/21/20 2139  GLUCAP 141* 147*   Lipid Profile: No results for input(s): CHOL, HDL, LDLCALC, TRIG, CHOLHDL, LDLDIRECT in the last 72 hours. Thyroid Function Tests: No results for input(s): TSH, T4TOTAL, FREET4, T3FREE, THYROIDAB in the last 72 hours. Anemia Panel: No results for input(s): VITAMINB12, FOLATE, FERRITIN, TIBC, IRON, RETICCTPCT in  the last 72 hours. Sepsis Labs: No results for input(s): PROCALCITON, LATICACIDVEN in the last 168 hours.  Recent Results (from the past 240 hour(s))  SARS CORONAVIRUS 2 (TAT 6-24 HRS) Nasopharyngeal Nasopharyngeal Swab     Status: None   Collection Time: 07/18/20  4:53 PM   Specimen: Nasopharyngeal Swab  Result Value Ref Range Status   SARS Coronavirus 2 NEGATIVE NEGATIVE Final    Comment: (NOTE) SARS-CoV-2 target nucleic acids are NOT DETECTED.  The SARS-CoV-2 RNA is generally detectable in upper and lower respiratory specimens during the acute phase of infection. Negative results do not preclude SARS-CoV-2 infection, do not rule out co-infections with other pathogens, and should not be used  as the sole basis for treatment or other patient management decisions. Negative results must be combined with clinical observations, patient history, and epidemiological information. The expected result is Negative.  Fact Sheet for Patients: SugarRoll.be  Fact Sheet for Healthcare Providers: https://www.woods-mathews.com/  This test is not yet approved or cleared by the [place] FDA and  has been authorized for detection and/or diagnosis of SARS-CoV-2 by FDA under an Emergency Use Authorization (EUA). This EUA will remain  in effect (meaning this test can be used) for the duration of the COVID-19 declaration under Se ction 564(b)(1) of the Act, 21 U.S.C. section 360bbb-3(b)(1), unless the authorization is terminated or revoked sooner.  Performed at [facility], [place] [address], [place], [place] [address]     Radiology Studies: CT HEAD WO CONTRAST  Result Date: 07/21/2020 CLINICAL DATA:  Acute neuro deficit. Right facial droop. History of subdural hematoma. EXAM: CT HEAD WITHOUT CONTRAST TECHNIQUE: Contiguous axial images were obtained from the base of the skull through the vertex without intravenous contrast. COMPARISON:  07/18/2020 FINDINGS: Brain: Small persistent but fading left-sided subdural hematoma with maximum diameter of 5 mm. This previously 7.5 mm. Stable subacute subdural fluid more anteriorly in the left frontal area. Stable subdural blood or layering subarachnoid blood along the left tentorium. No progressive findings. No findings for new/acute hemorrhage when compared to the prior study. The ventricles are in the midline without mass effect or shift. Stable age related cerebral atrophy, ventriculomegaly and periventricular white matter disease. Vascular: Stable vascular calcifications. No aneurysm or hyperdense vessels. Skull: No skull fracture or bone lesions. Sinuses/Orbits: The paranasal sinuses and mastoid air cells are  clear. The globes are intact. Other: No scalp lesions or hematoma. IMPRESSION: 1. Slight interval decrease in size of the left-sided subdural hematoma. 2. Stable subacute subdural fluid more anteriorly in the left frontal area. 3. Stable subdural blood or layering subarachnoid blood along the left tentorium. 4. Stable age related cerebral atrophy, ventriculomegaly and periventricular white matter disease. Electronically Signed   By: Marijo Sanes M.D.   On: 07/21/2020 13:47   DG Swallowing Func-Speech Pathology  Result Date: 07/22/2020 Objective Swallowing Evaluation: Type of Study: MBS-Modified Barium Swallow Study  Patient Details Name: Justin Gomez MRN: [id number] Date of Birth: [birth year] Today's Date: 07/22/2020 Time: SLP Start Time (ACUTE ONLY): 1100 -SLP Stop Time (ACUTE ONLY): 1200 SLP Time Calculation (min) (ACUTE ONLY): 60 min Past Medical History: Past Medical History: Diagnosis Date . BPH (benign prostatic hypertrophy)  . Cerebral thrombosis with cerebral infarction 07/16/2017 . 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 Diverticulosis  . Essential hypertension 11/29/2015 . Gastroesophageal reflux disease 04/13/2016 . Hiatal hernia  . Hyperlipidemia  . Hypothyroidism  . Lumbar herniated disc   L4 . Mild neurocognitive disorder, unclear etiology 09/24/2019 . Multiple system atrophy  .  Parkinson's disease 07/31/2017 . Postural dizziness with near syncope 07/14/2017 . Postural hypotension 01/22/2017 . Subdural hematoma 07/15/2017 . Type 2 diabetes mellitus without complication, without long-term current use of insulin ([place]) 11/29/2015 Past Surgical History: Past Surgical History: Procedure Laterality Date . APPENDECTOMY  1956 . KNEE SURGERY  2009  right . SHOULDER SURGERY  2010  left HPI: 80 y.o. male with medical history significant of CVA, hypertension, GERD, Parkinson's disease, postural hypotension,'s diabetes, hypothyroidism, recent admission for subdural hematoma who presents with some intermittent confusion and  difficulty speaking for the past 2 days. Patient was recently admitted from 1/21-1/22 after he had a fall and hit his head and was noted to have a subdural hematoma and associated slurred speech.  A repeat CT the following day was stable and there was no indication for surgical intervention and his slurred speech was improving so he was discharged home.  He was discharged on Keppra due to a 15-second seizure-like episode in the ED during that admission.  He has been taking all prescribed medications including Keppra at home. Repeat CT of head this admission showed There is now a thin amount of acute-appearing subdural hemorrhage seen overlying the LEFT tentorium, likely interval extension from the aforementioned hemorrhage overlying the LEFT frontoparietal lobe.  Subjective: alert, upright in chair for procedure Assessment / Plan / Recommendation CHL IP CLINICAL IMPRESSIONS 07/22/2020 Clinical Impression Pt presents with an acute on chronic dysphagia, suspected to be exacerbated by recent hospital stays and in setting of PD. Mild oral dysphagia was exhbited and moderate to severe pharyngeal dysphagia was noted. Oral deficits included delayed AP transit, mild oral residuals, and reduced bolus cohesion with mechanical soft textures. Global pharyngeal deficits included reduced base of tongue retraction, poor epiglottic deflection (likely impacted by protrusion of posterior pharyngeal wall from cervical osteophyte), reduced laryngeal elevation, poor UES opening, reduced laryngeal vestibule and glottic closure, and diminished sensation. This allowed for frequent pre and during the swallow penetration of ALL consistencies tested with episodic aspiration. Gross silent aspiration noted with thins via straw use. Smaller amounts of aspiration noted with nectar and honey thick liquids from post swallow residuals. Residual amounts noted with all PO, moderate in the valleculae and pyriform sinuses. Pt dysphagia has progressed  since last objective swallow study in 06/2019. SLP discussed with pt and spouse, notified MD. Aspiration risk cannot be eliminated with PO diet. Pt and spouse wish to continue PO diet with known risk and use of safe swallowing strategies, oropharyngeal rehabilitation with SLP, and diligent oral care. SLP to closely monitor. Recommend dysphagia 3 (mechanical soft) and thin liquids with no straws and medicines crushed in puree.   SLP Visit Diagnosis Dysphagia, oropharyngeal phase (R13.12) Attention and concentration deficit following -- Frontal lobe and executive function deficit following -- Impact on safety and function Moderate aspiration risk;Severe aspiration risk   CHL IP TREATMENT RECOMMENDATION 07/22/2020 Treatment Recommendations Therapy as outlined in treatment plan below   Prognosis 07/22/2020 Prognosis for Safe Diet Advancement Fair Barriers to Reach Goals Severity of deficits Barriers/Prognosis Comment -- CHL IP DIET RECOMMENDATION 07/22/2020 SLP Diet Recommendations Thin liquid;Dysphagia 3 (Mech soft) solids Liquid Administration via Cup;No straw Medication Administration -- Compensations Small sips/bites;Slow rate;Minimize environmental distractions;Clear throat after each swallow;Multiple dry swallows after each bite/sip;Effortful swallow Postural Changes Remain semi-upright after after feeds/meals (Comment);Seated upright at 90 degrees   CHL IP OTHER RECOMMENDATIONS 07/22/2020 Recommended Consults -- Oral Care Recommendations Oral care BID;Oral care before and after PO Other Recommendations --   CHL IP  FOLLOW UP RECOMMENDATIONS 07/22/2020 Follow up Recommendations Skilled Nursing facility   [facility] IP FREQUENCY AND DURATION 07/22/2020 Speech Therapy Frequency (ACUTE ONLY) min 2x/week Treatment Duration 2 weeks      CHL IP ORAL PHASE 07/22/2020 Oral Phase Impaired Oral - Pudding Teaspoon -- Oral - Pudding Cup -- Oral - Honey Teaspoon -- Oral - Honey Cup Weak lingual manipulation;Lingual/palatal residue;Reduced posterior  propulsion;Delayed oral transit Oral - Nectar Teaspoon -- Oral - Nectar Cup Weak lingual manipulation;Reduced posterior propulsion;Delayed oral transit;Lingual/palatal residue Oral - Nectar Straw -- Oral - Thin Teaspoon -- Oral - Thin Cup Delayed oral transit;Reduced posterior propulsion;Weak lingual manipulation;Lingual/palatal residue Oral - Thin Straw Delayed oral transit;Incomplete tongue to palate contact Oral - Puree Lingual/palatal residue;Delayed oral transit;Weak lingual manipulation Oral - Mech Soft Impaired mastication;Weak lingual manipulation;Reduced posterior propulsion;Delayed oral transit;Lingual/palatal residue Oral - Regular -- Oral - Multi-Consistency -- Oral - Pill Reduced posterior propulsion;Holding of bolus;Other (Comment) Oral Phase - Comment --  CHL IP PHARYNGEAL PHASE 07/22/2020 Pharyngeal Phase Impaired Pharyngeal- Pudding Teaspoon -- Pharyngeal -- Pharyngeal- Pudding Cup -- Pharyngeal -- Pharyngeal- Honey Teaspoon -- Pharyngeal -- Pharyngeal- Honey Cup Reduced epiglottic inversion;Reduced anterior laryngeal mobility;Reduced laryngeal elevation;Reduced airway/laryngeal closure;Reduced pharyngeal peristalsis;Penetration/Aspiration before swallow;Penetration/Aspiration during swallow;Trace aspiration;Pharyngeal residue - valleculae;Pharyngeal residue - pyriform;Pharyngeal residue - posterior pharnyx Pharyngeal Material enters airway, remains ABOVE vocal cords and not ejected out;Material enters airway, passes BELOW cords without attempt by patient to eject out (silent aspiration) Pharyngeal- Nectar Teaspoon -- Pharyngeal -- Pharyngeal- Nectar Cup Delayed swallow initiation-pyriform sinuses;Reduced pharyngeal peristalsis;Reduced epiglottic inversion;Reduced anterior laryngeal mobility;Reduced laryngeal elevation;Reduced airway/laryngeal closure;Reduced tongue base retraction;Penetration/Aspiration before swallow;Penetration/Aspiration during swallow;Pharyngeal residue - valleculae;Pharyngeal  residue - pyriform Pharyngeal Material enters airway, remains ABOVE vocal cords and not ejected out;Material enters airway, passes BELOW cords without attempt by patient to eject out (silent aspiration) Pharyngeal- Nectar Straw -- Pharyngeal -- Pharyngeal- Thin Teaspoon -- Pharyngeal -- Pharyngeal- Thin Cup Delayed swallow initiation-pyriform sinuses;Reduced pharyngeal peristalsis;Reduced epiglottic inversion;Reduced anterior laryngeal mobility;Reduced laryngeal elevation;Reduced airway/laryngeal closure;Reduced tongue base retraction;Penetration/Aspiration before swallow;Penetration/Aspiration during swallow;Pharyngeal residue - valleculae;Pharyngeal residue - pyriform Pharyngeal Material enters airway, passes BELOW cords without attempt by patient to eject out (silent aspiration);Material enters airway, remains ABOVE vocal cords and not ejected out Pharyngeal- Thin Straw Delayed swallow initiation-pyriform sinuses;Reduced pharyngeal peristalsis;Reduced epiglottic inversion;Reduced anterior laryngeal mobility;Reduced laryngeal elevation;Reduced airway/laryngeal closure;Reduced tongue base retraction;Penetration/Aspiration before swallow;Penetration/Aspiration during swallow;Significant aspiration (Amount);Pharyngeal residue - pyriform;Pharyngeal residue - valleculae Pharyngeal Material enters airway, passes BELOW cords without attempt by patient to eject out (silent aspiration) Pharyngeal- Puree Delayed swallow initiation-vallecula;Reduced pharyngeal peristalsis;Reduced epiglottic inversion;Reduced anterior laryngeal mobility;Reduced laryngeal elevation;Reduced airway/laryngeal closure;Reduced tongue base retraction;Penetration/Aspiration before swallow;Penetration/Aspiration during swallow;Pharyngeal residue - valleculae;Pharyngeal residue - pyriform Pharyngeal Material enters airway, remains ABOVE vocal cords and not ejected out Pharyngeal- Mechanical Soft Delayed swallow initiation-vallecula;Reduced pharyngeal  peristalsis;Reduced epiglottic inversion;Reduced anterior laryngeal mobility;Reduced airway/laryngeal closure;Reduced laryngeal elevation;Reduced tongue base retraction;Penetration/Aspiration before swallow;Penetration/Aspiration during swallow;Pharyngeal residue - valleculae;Pharyngeal residue - pyriform Pharyngeal -- Pharyngeal- Regular -- Pharyngeal -- Pharyngeal- Multi-consistency -- Pharyngeal -- Pharyngeal- Pill Other (Comment) Pharyngeal -- Pharyngeal Comment --  CHL IP CERVICAL ESOPHAGEAL PHASE 07/22/2020 Cervical Esophageal Phase Impaired Pudding Teaspoon -- Pudding Cup -- Honey Teaspoon -- Honey Cup Reduced cricopharyngeal relaxation;Prominent cricopharyngeal segment Nectar Teaspoon -- Nectar Cup Reduced cricopharyngeal relaxation;Prominent cricopharyngeal segment Nectar Straw -- Thin Teaspoon -- Thin Cup Reduced cricopharyngeal relaxation;Prominent cricopharyngeal segment Thin Straw Reduced cricopharyngeal relaxation;Prominent cricopharyngeal segment Puree Reduced cricopharyngeal relaxation;Prominent cricopharyngeal segment Mechanical Soft Reduced cricopharyngeal relaxation;Prominent cricopharyngeal segment Regular -- Multi-consistency -- Pill -- Cervical Esophageal  Comment -- [place], [place] 07/22/2020, 12:03 PM              Scheduled Meds: . atorvastatin  20 mg Oral q1800  . carbidopa-levodopa  2 tablet Oral TID  . feeding supplement  1 Container Oral TID BM  . finasteride  5 mg Oral Daily  . levothyroxine  75 mcg Oral Daily  . losartan  25 mg Oral Daily  . metFORMIN  750 mg Oral BID  . mirabegron ER  50 mg Oral Daily  . pantoprazole  40 mg Oral Daily  . sodium chloride flush  3 mL Intravenous Q12H  . valproic acid  500 mg Oral BID   Continuous Infusions: . lactated ringers 50 mL/hr at 07/22/20 1649     LOS: 4 days   Time spent: 40 minutes  Little Ishikawa, DO [facility]  If 7PM-7AM, please contact  night-coverage [email or web address]  07/23/2020, 7:04 AM

## 2020-07-24 DIAGNOSIS — I10 Essential (primary) hypertension: Secondary | ICD-10-CM | POA: Diagnosis not present

## 2020-07-24 LAB — CBC
HCT: 31.6 % — ABNORMAL LOW (ref 39.0–52.0)
Hemoglobin: 10.5 g/dL — ABNORMAL LOW (ref 13.0–17.0)
MCH: 30.3 pg (ref 26.0–34.0)
MCHC: 33.2 g/dL (ref 30.0–36.0)
MCV: 91.1 fL (ref 80.0–100.0)
Platelets: 161 10*3/uL (ref 150–400)
RBC: 3.47 MIL/uL — ABNORMAL LOW (ref 4.22–5.81)
RDW: 13 % (ref 11.5–15.5)
WBC: 7.5 10*3/uL (ref 4.0–10.5)
nRBC: 0 % (ref 0.0–0.2)

## 2020-07-24 LAB — COMPREHENSIVE METABOLIC PANEL
ALT: <5 U/L (ref 0–44)
AST: 12 U/L — ABNORMAL LOW (ref 15–41)
Albumin: 2.7 g/dL — ABNORMAL LOW (ref 3.5–5.0)
Alkaline Phosphatase: 46 U/L (ref 38–126)
Anion gap: 10 (ref 5–15)
BUN: 14 mg/dL (ref 8–23)
CO2: 28 mmol/L (ref 22–32)
Calcium: 8.5 mg/dL — ABNORMAL LOW (ref 8.9–10.3)
Chloride: 101 mmol/L (ref 98–111)
Creatinine, Ser: 0.71 mg/dL (ref 0.61–1.24)
GFR, Estimated: >60 mL/min (ref >60)
Glucose, Bld: 123 mg/dL — ABNORMAL HIGH (ref 70–99)
Potassium: 3.4 mmol/L — ABNORMAL LOW (ref 3.5–5.1)
Sodium: 139 mmol/L (ref 135–145)
Total Bilirubin: 0.9 mg/dL (ref 0.3–1.2)
Total Protein: 5.3 g/dL — ABNORMAL LOW (ref 6.5–8.1)

## 2020-07-24 NOTE — Progress Notes (Signed)
PROGRESS NOTE    Justin Gomez  [id number] DOB: 12/12/1940 DOA: 07/18/2020 PCP: Jani Gravel, MD   Brief Narrative:  Justin Gomez is a 80 y.o. male with medical history significant of CVA, hypertension, GERD, Parkinson's disease, postural hypotension,'s diabetes, hypothyroidism, recent admission for subdural hematoma who presents with some intermittent confusion and difficulty speaking for the past 2 days. Patient was recently admitted from 1/21-1/22 after he had a fall with note subdural hematoma and associated slurred speech - repeat imaging negative and subsequently discharged home on keppra due to questionable seizure like activity. About 48h after starting keppra/returning home patient had increased intermittent confusion and difficulty speaking. In the ED: CT head showed a slight decrease of subdural hematoma at the left frontal parietal region which as above is decreased from 1/22.  There is a new thin acute subdural hematoma at the left tentorium which is consistent with an extension of the previous bleed.  No new/acute bleed suspected.  Neurosurgery consulted by EDP who states that due to this being an extension and not significant in size there is no indication for operative intervention, some irritation may be causing his waxing and waning symptoms.  Neurology was consulted as well, TRH requested to admit.  Patient admitted as above with acute worsening encephalopathy in the setting of recent intracranial bleed and subdural hematoma which appears to be resolving per repeat imaging.  He has had fluctuating speech and behavior issues but appears now to be improving drastically.  Patient was taken off of Keppra and placed on Depakote, thought to be affecting his mental status somewhat.  Profoundly weak with profoundly diminished swallowing capability, speech continues to remark on patient's extremely high risk for aspiration.  At this time patient is stable and reasonable for discharge to  SNF, bed has been made available for 07/26/2020.  Continue speech and PT evaluation, otherwise patient stable and agreeable for discharge, wife updated daily.   Assessment & Plan:   Principal Problem:   Intracranial bleeding (HCC) Active Problems:   Essential hypertension   Type 2 diabetes mellitus without complication, without long-term current use of insulin (HCC)   Gastroesophageal reflux disease   Postural hypotension   Cerebral thrombosis with cerebral infarction   Parkinson's disease   SDH (subdural hematoma) (HCC)   ICH (intracerebral hemorrhage) (HCC)  Intracranial bleeding with recent subdural hematoma Acute onset delirium/behavioral changes/encephalopathy, resolving Rule out atypical seizure/todds paralysis Rule out polypharmacy - At baseline per wife prior to fall and brain bleed- no speech deficits; standup/walked without assistance; minimal help with dressing; AOx4 with 'sharp mind' but occasionally became confused or misplaced items frequently - Patient's mental status is improving drastically compared to admission, he is much more awake alert, cooperative.  Waxing and waning speech but improved today - Neurology signing off, EEG shows diffuse encephalopathy without epileptiform discharges, appreciate their evaluation today with patient's worsening speech patterns - discontinued keppra 07/21/20 and started depakote 500 BID - outpatient follow up in 4-6 weeks   Chronic Parkinson's disease - Continue home Sinemet  Urinary frequency and incotninence - Continue finasteride, cannot continue Myrbetriq -cannot be crushed. - Place external urinary catheter  Diabetes - Continue Metformin -changed to non-XR formula so it can be crushed - CBG monitoring  Hypertension - Continue home losartan - Continue as needed medications in the meantime IV  History of CVA - Continue home atorvastatin once able to take p.o. safely  GERD - Continue home PPI  Hypothyroidism -  Continue home Synthroid once able  to take p.o. safely  DVT prophylaxis: SCDs Code Status: Full Family Communication: Wife updated - discussed patient's prognosis, current situation disposition and treatment.  Status is: Inpatient  Dispo: The patient is from: Home              Anticipated d/c is to: SNF              Anticipated d/c date is: 07/26/2020               Patient currently IS medically stable for discharge  Consultants:   Neurology, neurosurgery  Procedures:   None indicated  Antimicrobials:  None indicated  Subjective: No acute issues or events reported overnight, speech somewhat still slurred today, patient denies headache, vision changes, weakness, nausea, vomiting, diarrhea, constipation, fevers, chills.  Objective: Vitals:   07/23/20 2040 07/23/20 2350 07/24/20 0435 07/24/20 0744  BP: (!) 160/80 (!) 144/70 140/63 (!) 149/71  Pulse: 92 91 83 84  Resp: 18 18 20 18   Temp: 98.3 F (36.8 C) 98.3 F (36.8 C) 97.8 F (36.6 C) 98.4 F (36.9 C)  TempSrc: Oral Oral Axillary Oral  SpO2: 98% 92% 94% 93%    Intake/Output Summary (Last 24 hours) at 07/24/2020 0806 Last data filed at 07/24/2020 0600 Gross per 24 hour  Intake 1647.71 ml  Output 954.9 ml  Net 692.81 ml   There were no vitals filed for this visit.  Examination:  General: Pleasantly resting in bed, awake alert, oriented x4 HEENT:  Normocephalic atraumatic.  Sclerae nonicteric, noninjected.  Extraocular movements intact bilaterally. Slurred speech, difficult word finding. Neck:  Without mass or deformity.  Trachea is midline. Lungs:  Clear to auscultate bilaterally without rhonchi, wheeze, or rales. Heart:  Regular rate and rhythm.  Without murmurs, rubs, or gallops. Abdomen:  Soft, nontender, nondistended.  Without guarding or rebound. Extremities: Without cyanosis, clubbing, edema, or obvious deformity/weakness. Vascular:  Dorsalis pedis and posterior tibial pulses palpable bilaterally. Skin:  Warm  and dry, no erythema, no ulcerations.  Data Reviewed: I have personally reviewed following labs and imaging studies  CBC: Recent Labs  Lab 07/18/20 1136 07/19/20 0333 07/20/20 0335 07/21/20 0423 07/22/20 0344 07/23/20 0400 07/24/20 0232  WBC 7.0   < > 7.9 6.6 5.1 11.3* 7.5  NEUTROABS 5.5  --   --   --   --   --   --   HGB 11.8*   < > 11.3* 10.9* 11.3* 11.7* 10.5*  HCT 36.3*   < > 34.2* 33.3* 32.4* 34.3* 31.6*  MCV 93.1   < > 91.0 91.5 89.5 89.6 91.1  PLT 165   < > 190 167 175 184 161   < > = values in this interval not displayed.   Basic Metabolic Panel: Recent Labs  Lab 07/20/20 0335 07/21/20 0423 07/22/20 0344 07/23/20 0400 07/24/20 0232  NA 140 140 142 140 139  K 3.8 3.5 3.5 3.5 3.4*  CL 104 105 103 101 101  CO2 24 26 28 27 28   GLUCOSE 88 111* 111* 113* 123*  BUN 16 17 18 13 14   CREATININE 0.84 0.65 0.86 0.70 0.71  CALCIUM 9.0 8.9 8.8* 8.8* 8.5*   GFR: CrCl cannot be calculated (Unknown ideal weight.). Liver Function Tests: Recent Labs  Lab 07/20/20 0335 07/21/20 0423 07/22/20 0344 07/23/20 0400 07/24/20 0232  AST 15 16 15  14* 12*  ALT 18 6 8 7  <5  ALKPHOS 65 49 53 53 46  BILITOT 1.6* 0.6 0.8 1.0 0.9  PROT 6.1*  5.5* 5.6* 5.6* 5.3*  ALBUMIN 3.4* 3.0* 2.9* 3.0* 2.7*   No results for input(s): LIPASE, AMYLASE in the last 168 hours. No results for input(s): AMMONIA in the last 168 hours. Coagulation Profile: Recent Labs  Lab 07/18/20 1136  INR 1.2   Cardiac Enzymes: No results for input(s): CKTOTAL, CKMB, CKMBINDEX, TROPONINI in the last 168 hours. BNP (last 3 results) No results for input(s): PROBNP in the last 8760 hours. HbA1C: No results for input(s): HGBA1C in the last 72 hours. CBG: Recent Labs  Lab 07/21/20 1033 07/21/20 2139  GLUCAP 141* 147*   Lipid Profile: No results for input(s): CHOL, HDL, LDLCALC, TRIG, CHOLHDL, LDLDIRECT in the last 72 hours. Thyroid Function Tests: No results for input(s): TSH, T4TOTAL, FREET4, T3FREE,  THYROIDAB in the last 72 hours. Anemia Panel: No results for input(s): VITAMINB12, FOLATE, FERRITIN, TIBC, IRON, RETICCTPCT in the last 72 hours. Sepsis Labs: No results for input(s): PROCALCITON, LATICACIDVEN in the last 168 hours.  Recent Results (from the past 240 hour(s))  SARS CORONAVIRUS 2 (TAT 6-24 HRS) Nasopharyngeal Nasopharyngeal Swab     Status: None   Collection Time: 07/18/20  4:53 PM   Specimen: Nasopharyngeal Swab  Result Value Ref Range Status   SARS Coronavirus 2 NEGATIVE NEGATIVE Final    Comment: (NOTE) SARS-CoV-2 target nucleic acids are NOT DETECTED.  The SARS-CoV-2 RNA is generally detectable in upper and lower respiratory specimens during the acute phase of infection. Negative results do not preclude SARS-CoV-2 infection, do not rule out co-infections with other pathogens, and should not be used as the sole basis for treatment or other patient management decisions. Negative results must be combined with clinical observations, patient history, and epidemiological information. The expected result is Negative.  Fact Sheet for Patients: SugarRoll.be  Fact Sheet for Healthcare Providers: https://www.woods-mathews.com/  This test is not yet approved or cleared by the [place] FDA and  has been authorized for detection and/or diagnosis of SARS-CoV-2 by FDA under an Emergency Use Authorization (EUA). This EUA will remain  in effect (meaning this test can be used) for the duration of the COVID-19 declaration under Se ction 564(b)(1) of the Act, 21 U.S.C. section 360bbb-3(b)(1), unless the authorization is terminated or revoked sooner.  Performed at [facility], [place] [address], [place], [place] [address]     Radiology Studies: DG Swallowing Func-Speech Pathology  Result Date: 07/22/2020 Objective Swallowing Evaluation: Type of Study: MBS-Modified Barium Swallow Study  Patient Details Name: Justin Gomez  MRN: [id number] Date of Birth: Nov 03, 1940 Today's Date: 07/22/2020 Time: SLP Start Time (ACUTE ONLY): 1100 -SLP Stop Time (ACUTE ONLY): 1200 SLP Time Calculation (min) (ACUTE ONLY): 60 min Past Medical History: Past Medical History: Diagnosis Date . BPH (benign prostatic hypertrophy)  . Cerebral thrombosis with cerebral infarction 07/16/2017 . 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 Diverticulosis  . Essential hypertension 11/29/2015 . Gastroesophageal reflux disease 04/13/2016 . Hiatal hernia  . Hyperlipidemia  . Hypothyroidism  . Lumbar herniated disc   L4 . Mild neurocognitive disorder, unclear etiology 09/24/2019 . Multiple system atrophy  . Parkinson's disease 07/31/2017 . Postural dizziness with near syncope 07/14/2017 . Postural hypotension 01/22/2017 . Subdural hematoma 07/15/2017 . Type 2 diabetes mellitus without complication, without long-term current use of insulin ([place]) 11/29/2015 Past Surgical History: Past Surgical History: Procedure Laterality Date . APPENDECTOMY  1956 . KNEE SURGERY  2009  right . SHOULDER SURGERY  2010  left HPI: 80 y.o. male with medical history significant of CVA, hypertension, GERD, Parkinson's  disease, postural hypotension,'s diabetes, hypothyroidism, recent admission for subdural hematoma who presents with some intermittent confusion and difficulty speaking for the past 2 days. Patient was recently admitted from 1/21-1/22 after he had a fall and hit his head and was noted to have a subdural hematoma and associated slurred speech.  A repeat CT the following day was stable and there was no indication for surgical intervention and his slurred speech was improving so he was discharged home.  He was discharged on Keppra due to a 15-second seizure-like episode in the ED during that admission.  He has been taking all prescribed medications including Keppra at home. Repeat CT of head this admission showed There is now a thin amount of acute-appearing subdural hemorrhage seen overlying the LEFT  tentorium, likely interval extension from the aforementioned hemorrhage overlying the LEFT frontoparietal lobe.  Subjective: alert, upright in chair for procedure Assessment / Plan / Recommendation CHL IP CLINICAL IMPRESSIONS 07/22/2020 Clinical Impression Pt presents with an acute on chronic dysphagia, suspected to be exacerbated by recent hospital stays and in setting of PD. Mild oral dysphagia was exhbited and moderate to severe pharyngeal dysphagia was noted. Oral deficits included delayed AP transit, mild oral residuals, and reduced bolus cohesion with mechanical soft textures. Global pharyngeal deficits included reduced base of tongue retraction, poor epiglottic deflection (likely impacted by protrusion of posterior pharyngeal wall from cervical osteophyte), reduced laryngeal elevation, poor UES opening, reduced laryngeal vestibule and glottic closure, and diminished sensation. This allowed for frequent pre and during the swallow penetration of ALL consistencies tested with episodic aspiration. Gross silent aspiration noted with thins via straw use. Smaller amounts of aspiration noted with nectar and honey thick liquids from post swallow residuals. Residual amounts noted with all PO, moderate in the valleculae and pyriform sinuses. Pt dysphagia has progressed since last objective swallow study in 06/2019. SLP discussed with pt and spouse, notified MD. Aspiration risk cannot be eliminated with PO diet. Pt and spouse wish to continue PO diet with known risk and use of safe swallowing strategies, oropharyngeal rehabilitation with SLP, and diligent oral care. SLP to closely monitor. Recommend dysphagia 3 (mechanical soft) and thin liquids with no straws and medicines crushed in puree.   SLP Visit Diagnosis Dysphagia, oropharyngeal phase (R13.12) Attention and concentration deficit following -- Frontal lobe and executive function deficit following -- Impact on safety and function Moderate aspiration risk;Severe  aspiration risk   CHL IP TREATMENT RECOMMENDATION 07/22/2020 Treatment Recommendations Therapy as outlined in treatment plan below   Prognosis 07/22/2020 Prognosis for Safe Diet Advancement Fair Barriers to Reach Goals Severity of deficits Barriers/Prognosis Comment -- CHL IP DIET RECOMMENDATION 07/22/2020 SLP Diet Recommendations Thin liquid;Dysphagia 3 (Mech soft) solids Liquid Administration via Cup;No straw Medication Administration -- Compensations Small sips/bites;Slow rate;Minimize environmental distractions;Clear throat after each swallow;Multiple dry swallows after each bite/sip;Effortful swallow Postural Changes Remain semi-upright after after feeds/meals (Comment);Seated upright at 90 degrees   CHL IP OTHER RECOMMENDATIONS 07/22/2020 Recommended Consults -- Oral Care Recommendations Oral care BID;Oral care before and after PO Other Recommendations --   CHL IP FOLLOW UP RECOMMENDATIONS 07/22/2020 Follow up Recommendations Skilled Nursing facility   [facility] IP FREQUENCY AND DURATION 07/22/2020 Speech Therapy Frequency (ACUTE ONLY) min 2x/week Treatment Duration 2 weeks      CHL IP ORAL PHASE 07/22/2020 Oral Phase Impaired Oral - Pudding Teaspoon -- Oral - Pudding Cup -- Oral - Honey Teaspoon -- Oral - Honey Cup Weak lingual manipulation;Lingual/palatal residue;Reduced posterior propulsion;Delayed oral transit Oral - Nectar Teaspoon --  Oral - Nectar Cup Weak lingual manipulation;Reduced posterior propulsion;Delayed oral transit;Lingual/palatal residue Oral - Nectar Straw -- Oral - Thin Teaspoon -- Oral - Thin Cup Delayed oral transit;Reduced posterior propulsion;Weak lingual manipulation;Lingual/palatal residue Oral - Thin Straw Delayed oral transit;Incomplete tongue to palate contact Oral - Puree Lingual/palatal residue;Delayed oral transit;Weak lingual manipulation Oral - Mech Soft Impaired mastication;Weak lingual manipulation;Reduced posterior propulsion;Delayed oral transit;Lingual/palatal residue Oral - Regular -- Oral  - Multi-Consistency -- Oral - Pill Reduced posterior propulsion;Holding of bolus;Other (Comment) Oral Phase - Comment --  CHL IP PHARYNGEAL PHASE 07/22/2020 Pharyngeal Phase Impaired Pharyngeal- Pudding Teaspoon -- Pharyngeal -- Pharyngeal- Pudding Cup -- Pharyngeal -- Pharyngeal- Honey Teaspoon -- Pharyngeal -- Pharyngeal- Honey Cup Reduced epiglottic inversion;Reduced anterior laryngeal mobility;Reduced laryngeal elevation;Reduced airway/laryngeal closure;Reduced pharyngeal peristalsis;Penetration/Aspiration before swallow;Penetration/Aspiration during swallow;Trace aspiration;Pharyngeal residue - valleculae;Pharyngeal residue - pyriform;Pharyngeal residue - posterior pharnyx Pharyngeal Material enters airway, remains ABOVE vocal cords and not ejected out;Material enters airway, passes BELOW cords without attempt by patient to eject out (silent aspiration) Pharyngeal- Nectar Teaspoon -- Pharyngeal -- Pharyngeal- Nectar Cup Delayed swallow initiation-pyriform sinuses;Reduced pharyngeal peristalsis;Reduced epiglottic inversion;Reduced anterior laryngeal mobility;Reduced laryngeal elevation;Reduced airway/laryngeal closure;Reduced tongue base retraction;Penetration/Aspiration before swallow;Penetration/Aspiration during swallow;Pharyngeal residue - valleculae;Pharyngeal residue - pyriform Pharyngeal Material enters airway, remains ABOVE vocal cords and not ejected out;Material enters airway, passes BELOW cords without attempt by patient to eject out (silent aspiration) Pharyngeal- Nectar Straw -- Pharyngeal -- Pharyngeal- Thin Teaspoon -- Pharyngeal -- Pharyngeal- Thin Cup Delayed swallow initiation-pyriform sinuses;Reduced pharyngeal peristalsis;Reduced epiglottic inversion;Reduced anterior laryngeal mobility;Reduced laryngeal elevation;Reduced airway/laryngeal closure;Reduced tongue base retraction;Penetration/Aspiration before swallow;Penetration/Aspiration during swallow;Pharyngeal residue - valleculae;Pharyngeal  residue - pyriform Pharyngeal Material enters airway, passes BELOW cords without attempt by patient to eject out (silent aspiration);Material enters airway, remains ABOVE vocal cords and not ejected out Pharyngeal- Thin Straw Delayed swallow initiation-pyriform sinuses;Reduced pharyngeal peristalsis;Reduced epiglottic inversion;Reduced anterior laryngeal mobility;Reduced laryngeal elevation;Reduced airway/laryngeal closure;Reduced tongue base retraction;Penetration/Aspiration before swallow;Penetration/Aspiration during swallow;Significant aspiration (Amount);Pharyngeal residue - pyriform;Pharyngeal residue - valleculae Pharyngeal Material enters airway, passes BELOW cords without attempt by patient to eject out (silent aspiration) Pharyngeal- Puree Delayed swallow initiation-vallecula;Reduced pharyngeal peristalsis;Reduced epiglottic inversion;Reduced anterior laryngeal mobility;Reduced laryngeal elevation;Reduced airway/laryngeal closure;Reduced tongue base retraction;Penetration/Aspiration before swallow;Penetration/Aspiration during swallow;Pharyngeal residue - valleculae;Pharyngeal residue - pyriform Pharyngeal Material enters airway, remains ABOVE vocal cords and not ejected out Pharyngeal- Mechanical Soft Delayed swallow initiation-vallecula;Reduced pharyngeal peristalsis;Reduced epiglottic inversion;Reduced anterior laryngeal mobility;Reduced airway/laryngeal closure;Reduced laryngeal elevation;Reduced tongue base retraction;Penetration/Aspiration before swallow;Penetration/Aspiration during swallow;Pharyngeal residue - valleculae;Pharyngeal residue - pyriform Pharyngeal -- Pharyngeal- Regular -- Pharyngeal -- Pharyngeal- Multi-consistency -- Pharyngeal -- Pharyngeal- Pill Other (Comment) Pharyngeal -- Pharyngeal Comment --  CHL IP CERVICAL ESOPHAGEAL PHASE 07/22/2020 Cervical Esophageal Phase Impaired Pudding Teaspoon -- Pudding Cup -- Honey Teaspoon -- Honey Cup Reduced cricopharyngeal relaxation;Prominent  cricopharyngeal segment Nectar Teaspoon -- Nectar Cup Reduced cricopharyngeal relaxation;Prominent cricopharyngeal segment Nectar Straw -- Thin Teaspoon -- Thin Cup Reduced cricopharyngeal relaxation;Prominent cricopharyngeal segment Thin Straw Reduced cricopharyngeal relaxation;Prominent cricopharyngeal segment Puree Reduced cricopharyngeal relaxation;Prominent cricopharyngeal segment Mechanical Soft Reduced cricopharyngeal relaxation;Prominent cricopharyngeal segment Regular -- Multi-consistency -- Pill -- Cervical Esophageal Comment -- Hayden Rasmussen MA, CCC-SLP Acute Rehabilitation Services 07/22/2020, 12:03 PM              Scheduled Meds: . atorvastatin  20 mg Oral q1800  . carbidopa-levodopa  2 tablet Oral TID  . divalproex  500 mg Oral BID  . feeding supplement  1 Container Oral TID BM  . finasteride  5 mg Oral Daily  . levothyroxine  75 mcg  Oral Daily  . losartan  25 mg Oral Daily  . metFORMIN  750 mg Oral BID  . mirabegron ER  50 mg Oral Daily  . pantoprazole sodium  40 mg Oral Daily  . sodium chloride flush  3 mL Intravenous Q12H   Continuous Infusions: . lactated ringers 50 mL/hr at 07/23/20 1700     LOS: 5 days   Time spent: 40 minutes  Little Ishikawa, DO [facility]  If 7PM-7AM, please contact night-coverage [email or web address]  07/24/2020, 8:06 AM

## 2020-07-24 NOTE — Plan of Care (Signed)

## 2020-07-25 DIAGNOSIS — I10 Essential (primary) hypertension: Secondary | ICD-10-CM | POA: Diagnosis not present

## 2020-07-25 LAB — CBC
HCT: 32.2 % — ABNORMAL LOW (ref 39.0–52.0)
Hemoglobin: 10.5 g/dL — ABNORMAL LOW (ref 13.0–17.0)
MCH: 29.7 pg (ref 26.0–34.0)
MCHC: 32.6 g/dL (ref 30.0–36.0)
MCV: 91.2 fL (ref 80.0–100.0)
Platelets: 152 K/uL (ref 150–400)
RBC: 3.53 MIL/uL — ABNORMAL LOW (ref 4.22–5.81)
RDW: 13 % (ref 11.5–15.5)
WBC: 6.5 K/uL (ref 4.0–10.5)
nRBC: 0 % (ref 0.0–0.2)

## 2020-07-25 LAB — COMPREHENSIVE METABOLIC PANEL WITH GFR
ALT: 6 U/L (ref 0–44)
AST: 11 U/L — ABNORMAL LOW (ref 15–41)
Albumin: 2.7 g/dL — ABNORMAL LOW (ref 3.5–5.0)
Alkaline Phosphatase: 44 U/L (ref 38–126)
Anion gap: 11 (ref 5–15)
BUN: 15 mg/dL (ref 8–23)
CO2: 28 mmol/L (ref 22–32)
Calcium: 8.5 mg/dL — ABNORMAL LOW (ref 8.9–10.3)
Chloride: 102 mmol/L (ref 98–111)
Creatinine, Ser: 0.75 mg/dL (ref 0.61–1.24)
GFR, Estimated: >60 mL/min
Glucose, Bld: 98 mg/dL (ref 70–99)
Potassium: 3.4 mmol/L — ABNORMAL LOW (ref 3.5–5.1)
Sodium: 141 mmol/L (ref 135–145)
Total Bilirubin: 0.9 mg/dL (ref 0.3–1.2)
Total Protein: 5.5 g/dL — ABNORMAL LOW (ref 6.5–8.1)

## 2020-07-25 MED ORDER — DEXTROMETHORPHAN POLISTIREX ER 30 MG/5ML PO SUER
15.0000 mg | Freq: Two times a day (BID) | ORAL | Status: DC | PRN
  Administered 2020-07-25 – 2020-07-26 (×2): 15 mg via ORAL
  Filled 2020-07-25 (×3): qty 5

## 2020-07-25 NOTE — Progress Notes (Signed)
[facility] Progress Note  Patient: Justin Gomez    RCV:[id number]  DOA: 07/18/2020     Date of Service: the patient was seen and examined on 07/25/2020  Brief hospital course: Past medical history of CVA, HTN, GERD, Parkinson's disease, type II DM, hypothyroidism, SDH.  Presents with complaints of ongoing confusion. Patient was recently admitted from 1/21-1/22 after he had a fall with subdural hematoma and associated slurred speech - repeat imaging negative and subsequently discharged home on keppra due to questionable seizure like activity. About 48h after starting keppra/returning home patient had increased intermittent confusion and difficulty speaking. CT scan shows a new thin acute subdural hematoma at the left tentorium which is consistent with an extension of the previous bleed. Neurosurgery consulted by EDP who states that due to this being an extension and not significant in size there is no indication for operative intervention, some irritation may be causing his waxing and waning symptoms. Neurology was consulted as well, [facility] requested to admit.  Currently plan is await for SNF discharge likely tomorrow on 07/26/2020.  Assessment and Plan: Intracranial bleeding with recent subdural hematoma Acute onset delirium/behavioral changes/encephalopathy, resolving Rule out atypical seizure/todds paralysis Rule out polypharmacy At baseline per wife prior to fall and brain bleed no speech deficits; standup/walked without assistance; minimal help with dressing; AOx4 with 'sharp mind' but occasionally became confused or misplaced items frequently Patient's mental status is improving drastically compared to admission, he is much more awake alert, cooperative. 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 Place external urinary catheter  Diabetes Hold Metformin  Changed to non-XR formula so it can be crushed CBG monitoring  Hypertension Continue home losartan Continue as needed medications in the meantime IV  History of CVA Continue home atorvastatin  GERD Continue home PPI  Hypothyroidism Continue home Synthroid once able to take p.o. safely  Diet: Dysphagia 3 diet thin liquid. DVT Prophylaxis:   SCDs Start: 07/18/20 2030    Advance goals of care discussion: Full code  Family Communication: no family was present at bedside, at the time of interview.   Disposition:  Status is: Inpatient  Remains inpatient appropriate because:Unsafe d/c plan   Dispo: The patient is from: Home              Anticipated d/c is to: SNF              Anticipated d/c date is: 1 day              Patient currently is medically stable to d/c.   Difficult to place patient No  Subjective: Frustrated that he is still in the hospital and not at the rehab.  No acute complaints.  No nausea no vomiting.   Physical Exam:  General: Appear in mild distress, no Rash; Oral Mucosa Clear, moist. no Abnormal Neck Mass Or lumps, Conjunctiva normal  Cardiovascular: S1 and S2 Present, no Murmur, Respiratory: good respiratory effort, Bilateral Air entry present and CTA, no Crackles, no wheezes Abdomen: Bowel Sound present, Soft and no tenderness Extremities: no Pedal edema Neurology: alert and oriented to place and person affect anxious.  Has dysarthria, no new focal deficit Gait not checked due to patient safety concerns  Vitals:   07/25/20 0028  07/25/20 0827 07/25/20 1201 07/25/20 1559  BP: 137/72 (!) 163/88 (!) 146/77 (!) 145/72  Pulse: 75 82 84 83  Resp: 18 16 16 18   Temp: 98.3 F (36.8 C) 97.8 F (36.6 C) 98.5 F (36.9 C) 98.3 F (36.8 C)  TempSrc: Oral  Oral Oral Oral  SpO2: 95% 96% 97% 100%    Intake/Output Summary (Last 24 hours) at 07/25/2020 1703 Last data filed at 07/25/2020 0900 Gross per 24 hour  Intake 740 ml  Output -  Net 740 ml   There were no vitals filed for this visit.  Data Reviewed: I have personally reviewed and interpreted daily labs, tele strips, imaging. I reviewed all nursing notes, pharmacy notes, vitals, pertinent old records I have discussed plan of care as described above with RN and patient/family.  CBC: Recent Labs  Lab 07/21/20 0423 07/22/20 0344 07/23/20 0400 07/24/20 0232 07/25/20 0235  WBC 6.6 5.1 11.3* 7.5 6.5  HGB 10.9* 11.3* 11.7* 10.5* 10.5*  HCT 33.3* 32.4* 34.3* 31.6* 32.2*  MCV 91.5 89.5 89.6 91.1 91.2  PLT 167 175 184 161 376   Basic Metabolic Panel: Recent Labs  Lab 07/21/20 0423 07/22/20 0344 07/23/20 0400 07/24/20 0232 07/25/20 0235  NA 140 142 140 139 141  K 3.5 3.5 3.5 3.4* 3.4*  CL 105 103 101 101 102  CO2 26 28 27 28 28   GLUCOSE 111* 111* 113* 123* 98  BUN 17 18 13 14 15   CREATININE 0.65 0.86 0.70 0.71 0.75  CALCIUM 8.9 8.8* 8.8* 8.5* 8.5*    Studies: No results found.  Scheduled Meds: . atorvastatin  20 mg Oral q1800  . carbidopa-levodopa  2 tablet Oral TID  . divalproex  500 mg Oral BID  . feeding supplement  1 Container Oral TID BM  . finasteride  5 mg Oral Daily  . levothyroxine  75 mcg Oral Daily  . losartan  25 mg Oral Daily  . mirabegron ER  50 mg Oral Daily  . pantoprazole sodium  40 mg Oral Daily  . sodium chloride flush  3 mL Intravenous Q12H   Continuous Infusions: PRN Meds: acetaminophen **OR** acetaminophen, dextromethorphan, diphenhydrAMINE-zinc acetate, polyethylene glycol  Time spent: 35 minutes  Author: Berle Mull, MD [facility] 07/25/2020 5:03 PM  To reach On-call, see care teams to locate the attending and reach out via [email or web address]. Between 7PM-7AM, please contact night-coverage If you still have difficulty reaching the  attending provider, please page the [facility] (Director on Call) for [facility] on amion for assistance.

## 2020-07-26 ENCOUNTER — Telehealth: Payer: Self-pay

## 2020-07-26 DIAGNOSIS — I10 Essential (primary) hypertension: Secondary | ICD-10-CM | POA: Diagnosis not present

## 2020-07-26 LAB — SARS CORONAVIRUS 2 (TAT 6-24 HRS): SARS Coronavirus 2: NEGATIVE

## 2020-07-26 NOTE — Plan of Care (Signed)
  Problem: Education: Goal: Knowledge of General Education information will improve Description: Including pain rating scale, medication(s)/side effects and non-pharmacologic comfort measures Outcome: Progressing   Problem: Skin Integrity: Goal: Risk for impaired skin integrity will decrease Outcome: Progressing   Problem: Education: Goal: Knowledge of General Education information will improve Description: Including pain rating scale, medication(s)/side effects and non-pharmacologic comfort measures Outcome: Progressing   

## 2020-07-26 NOTE — Progress Notes (Signed)
Physical Therapy Treatment Patient Details Name: Justin Gomez MRN: [id number] DOB: Nov 10, 1940 Today's Date: 07/26/2020    History of Present Illness Justin Gomez is a 80 y.o. male with medical history significant of CVA, hypertension, GERD, Parkinson's disease, postural hypotension,'s diabetes, hypothyroidism, recent admission 1-21-1/22 for subdural hematoma s/p fall who presents with some intermittent confusion and difficulty speaking for the past 2 days. Pt found to have new thin acute subdural hematoma at L tentorium.    PT Comments    Patient progressing well towards PT goals. Tolerated gait training with Min guard-Min A for balance/safety and cues for safety with RW. Pt with increased speed/momentum and difficulty with turns putting pt at increased risk for falls. Demonstrates parkinsonism type gait pattern with shuffling/festinating at times esp with turns or changes in direction. Initially requires Min A to stand due to retropulsion but able to correct technique with proper cues. Pt with difficulty problem solving how to use yanker suction despite max cues/demonstration. Pt with wet and gargling voice; encouraged pt to cough. Irritated and upset that he is going to rehab. Will continue to follow and progress as tolerated.   Follow Up Recommendations  SNF;Supervision for mobility/OOB     Equipment Recommendations  None recommended by PT    Recommendations for Other Services       Precautions / Restrictions Precautions Precautions: Fall Precaution Comments: seizure precautions Restrictions Weight Bearing Restrictions: No    Mobility  Bed Mobility Overal bed mobility: Needs Assistance Bed Mobility: Supine to Sit     Supine to sit: Mod assist     General bed mobility comments: Up in chair upon PT arrival.  Transfers Overall transfer level: Needs assistance Equipment used: Rolling walker (2 wheeled) Transfers: Sit to/from Stand Sit to Stand: Min assist;Min guard          General transfer comment: Initially Min A to power to standing due to retropulsion with cues for hand placement/technique and for "nose over toes." Able to perform consecutively as there ex after walk x10 from chair.  Ambulation/Gait Ambulation/Gait assistance: Min assist;Min guard Gait Distance (Feet): 200 Feet Assistive device: Rolling walker (2 wheeled) Gait Pattern/deviations: Step-through pattern;Decreased stride length;Narrow base of support;Festinating;Trunk flexed Gait velocity: increased, unsafe at times, 1.7 ft/sec Gait velocity interpretation: 1.31 - 2.62 ft/sec, indicative of limited community ambulator General Gait Details: Parkinsonism type gait pattern with increased speed/momentum forward needing cues to slow down for safety; cues for RW proximity/management. Difficulty with turns and staying within RW with some shuffling/festinating pattern present.   Stairs             Wheelchair Mobility    Modified Rankin (Stroke Patients Only) Modified Rankin (Stroke Patients Only) Pre-Morbid Rankin Score: Moderate disability Modified Rankin: Moderately severe disability     Balance Overall balance assessment: Needs assistance Sitting-balance support: Feet supported;No upper extremity supported Sitting balance-Leahy Scale: Good     Standing balance support: During functional activity Standing balance-Leahy Scale: Poor Standing balance comment: Requires UE support in standing.                            Cognition Arousal/Alertness: Awake/alert Behavior During Therapy: WFL for tasks assessed/performed;Flat affect (irritated and getting worked up at points about dc) Overall Cognitive Status: Impaired/Different from baseline Area of Impairment: Memory;Problem solving                     Memory: Decreased short-term memory  Problem Solving: Requires verbal cues;Requires tactile cues;Slow processing General Comments: Pt with  difficulty figuring out how to use yanker suction despite explanation/demonstration on a few attempts. Pt irritated about not being dced to home today and having to go to rehab.      Exercises Other Exercises Other Exercises: Sit to stand from chair x10 for strengthening/proper technique, not standing fully all the way upright with last reps    General Comments General comments (skin integrity, edema, etc.): daughter present towards end of session.      Pertinent Vitals/Pain Pain Assessment: Faces Faces Pain Scale: No hurt    Home Living                      Prior Function            PT Goals (current goals can now be found in the care plan section) Acute Rehab PT Goals Patient Stated Goal: To return home. Progress towards PT goals: Progressing toward goals    Frequency    Min 3X/week      PT Plan Current plan remains appropriate    Co-evaluation              AM-PAC PT "6 Clicks" Mobility   Outcome Measure  Help needed turning from your back to your side while in a flat bed without using bedrails?: A Little Help needed moving from lying on your back to sitting on the side of a flat bed without using bedrails?: A Little Help needed moving to and from a bed to a chair (including a wheelchair)?: A Little Help needed standing up from a chair using your arms (e.g., wheelchair or bedside chair)?: A Little Help needed to walk in hospital room?: A Little Help needed climbing 3-5 steps with a railing? : A Lot 6 Click Score: 17    End of Session Equipment Utilized During Treatment: Gait belt Activity Tolerance: Patient tolerated treatment well Patient left: in chair;with call bell/phone within reach;with chair alarm set;with family/visitor present Nurse Communication: Mobility status PT Visit Diagnosis: Unsteadiness on feet (R26.81);Difficulty in walking, not elsewhere classified (R26.2);Other abnormalities of gait and mobility (R26.89);History of falling  (Z91.81);Other symptoms and signs involving the nervous system (R29.898)     Time: 1244-1310 PT Time Calculation (min) (ACUTE ONLY): 26 min  Charges:  $Gait Training: 8-22 mins $Therapeutic Activity: 8-22 mins                     Marisa Severin, PT, DPT Acute Rehabilitation Services Pager [phone] Office [place] 07/26/2020, 2:39 PM

## 2020-07-26 NOTE — Telephone Encounter (Signed)
Spoke with daughter who states the patient is in the hospital and going to rehab. She states the patient wanted her to contact our office and make Dr Tat aware that he is having swallowing is getting worse. She states the patient is concerned that maybe Dr Tat does not have the information he needs to get better. She states she thinks her dad is going to [facility] for a little while. She states the patient is concerned because his pcp is retiring due to cancer and the patient is concerned about finding about finding another pcp.   I advised patients daughter that you are aware he was in the hospital. I informed her that Dr Tat does not go to the hospital but that does not mean that she does not care about her patients. Advised her that if neurology was needed in the hospital then they would provide care for the patient and seek her advice if needed. She voiced understanding.

## 2020-07-26 NOTE — TOC Progression Note (Signed)
Transition of Care [facility]) - Progression Note    Patient Details  Name: Justin Gomez MRN: [id number] Date of Birth: February 26, 1941  Transition of Care [facility]) CM/SW [place], [place] Phone Number: 07/26/2020, 11:30 AM  Clinical Narrative:   CSW reached out to [facility] about bed availability, and they will not have a bed until tomorrow. CSW spoke with patient's wife to provide update. CSW updated MD with barrier to discharge. COVID test is pending. CSW to follow.    Expected Discharge Plan: [place] Barriers to Discharge: Continued Medical Work up  Expected Discharge Plan and Services Expected Discharge Plan: [place]: [place] arrangements for the past 2 months: Single Family Home                                       Social Determinants of Health (SDOH) Interventions    Readmission Risk Interventions No flowsheet data found.

## 2020-07-26 NOTE — Progress Notes (Signed)
Occupational Therapy Treatment Patient Details Name: Justin Gomez MRN: [id number] DOB: Jul 17, 1940 Today's Date: 07/26/2020    History of present illness Justin Gomez is a 80 y.o. male with medical history significant of CVA, hypertension, GERD, Parkinson's disease, postural hypotension,'s diabetes, hypothyroidism, recent admission 1-21-1/22 for subdural hematoma s/p fall who presents with some intermittent confusion and difficulty speaking for the past 2 days. Pt found to have new thin acute subdural hematoma at L tentorium.   OT comments  Patient met lying supine in bed. Patient frustrated with failure of male external catheter reporting soiled sheets/clothing. Patient required Mod A to transition from supine to EOB and Min to Mod A for UB/LB bathing/dressing. After bathing task patient less agitated. Simulated household mobility in hospital hallway with use of RW and Min guard. Increased assist needed when turning R<>L. Patient currently functioning at [facility] A overall for ADLs and ADL transfers with noted deficits in memory, safety awareness, and balance presenting increased fall risk. Continued recommendation for short-term SNF rehab prior to return home with wife.      Follow Up Recommendations  SNF    Equipment Recommendations  3 in 1 bedside commode    Recommendations for Other Services      Precautions / Restrictions Precautions Precautions: Fall Precaution Comments: seizure precautions Restrictions Weight Bearing Restrictions: No       Mobility Bed Mobility Overal bed mobility: Needs Assistance Bed Mobility: Supine to Sit     Supine to sit: Mod assist     General bed mobility comments: Patient able to advance BLE toward EOB but required Mod A to elevate trunk. HOB elevated to 40 degrees.  Transfers Overall transfer level: Needs assistance Equipment used: Rolling walker (2 wheeled) Transfers: Sit to/from Stand Sit to Stand: Min assist         General transfer  comment: Cues for hand placement and Min A for power up.    Balance Overall balance assessment: Mild deficits observed, not formally tested Sitting-balance support: Feet supported;No upper extremity supported Sitting balance-Leahy Scale: Good       Standing balance-Leahy Scale: Poor Standing balance comment: Min A to maintain standing balance during LB bathing tasks.                           ADL either performed or assessed with clinical judgement   ADL Overall ADL's : Needs assistance/impaired         Upper Body Bathing: Minimal assistance;Sitting Upper Body Bathing Details (indicate cue type and reason): Min A and cues for thoroughness. Lower Body Bathing: Minimal assistance;Sit to/from stand Lower Body Bathing Details (indicate cue type and reason): Patient able to wash upper/lower legs in figure-4 position and front perineal area in standing with assist to maintain standing balance. Assist to wash buttocks in standing. Upper Body Dressing : Minimal assistance;Sitting Upper Body Dressing Details (indicate cue type and reason): Donned anterior hospital gown with Min A. Lower Body Dressing: Sit to/from stand;Moderate assistance               Functional mobility during ADLs: Min guard;Minimal assistance General ADL Comments: Min A initially progressing to Min guard for ambulation in hallways up to 150-229ft.     Vision   Vision Assessment?: No apparent visual deficits   Perception     Praxis      Cognition Arousal/Alertness: Awake/alert Behavior During Therapy: Agitated Overall Cognitive Status: Impaired/Different from baseline Area of Impairment: Memory  Memory: Decreased short-term memory       Problem Solving: Slow processing;Difficulty sequencing;Requires verbal cues;Requires tactile cues General Comments: Patient able to sequence familiar ADL tasks without cues. Decreased STM noted.        Exercises      Shoulder Instructions       General Comments Failure of male external catheter. RN notified.    Pertinent Vitals/ Pain       Pain Assessment: No/denies pain  Home Living                                          Prior Functioning/Environment              Frequency  Min 2X/week        Progress Toward Goals  OT Goals(current goals can now be found in the care plan section)  Progress towards OT goals: Progressing toward goals  Acute Rehab OT Goals Patient Stated Goal: To return home. OT Goal Formulation: With patient Time For Goal Achievement: 08/04/20 Potential to Achieve Goals: Good ADL Goals Pt Will Perform Eating: Independently;sitting Pt Will Perform Grooming: with supervision;standing Pt Will Perform Upper Body Dressing: with supervision;sitting Pt Will Perform Lower Body Dressing: with supervision;sit to/from stand Pt Will Transfer to Toilet: with supervision;ambulating Pt Will Perform Toileting - Clothing Manipulation and hygiene: with supervision;sit to/from stand Pt/caregiver will Perform Home Exercise Program: Both right and left upper extremity;With written HEP provided;With Supervision  Plan Discharge plan remains appropriate;Frequency remains appropriate    Co-evaluation                 AM-PAC OT "6 Clicks" Daily Activity     Outcome Measure   Help from another person eating meals?: A Little (Purred diet and honey thick liquids) Help from another person taking care of personal grooming?: A Little Help from another person toileting, which includes using toliet, bedpan, or urinal?: A Little Help from another person bathing (including washing, rinsing, drying)?: A Lot Help from another person to put on and taking off regular upper body clothing?: A Little Help from another person to put on and taking off regular lower body clothing?: A Lot 6 Click Score: 16    End of Session Equipment Utilized During Treatment: Gait  belt;Rolling walker  OT Visit Diagnosis: Unsteadiness on feet (R26.81);Other abnormalities of gait and mobility (R26.89);Muscle weakness (generalized) (M62.81)   Activity Tolerance Patient tolerated treatment well   Patient Left in chair;with call bell/phone within reach;with chair alarm set   Nurse Communication          Time: 1020-1050 OT Time Calculation (min): 30 min  Charges: OT General Charges $OT Visit: 1 Visit OT Treatments $Self Care/Home Management : 8-22 mins $Therapeutic Activity: 8-22 mins  Paden Kuras H. OTR/L Supplemental OT, Department of rehab services [phone]   Lina Hitch R H. 07/26/2020, 1:20 PM

## 2020-07-26 NOTE — Progress Notes (Signed)
  Speech Language Pathology Treatment: Dysphagia  Patient Details Name: Justin Gomez MRN: [id number] DOB: 15-Aug-1940 Today's Date: 07/26/2020 Time: [id number] SLP Time Calculation (min) (ACUTE ONLY): 30 min  Assessment / Plan / Recommendation Clinical Impression  Pt much more alert today with more spontaneous communication and improved speech intelligibility. His daughter Anderson Malta was present for session. He demonstrated improved toleration of thin liquids with mild throat-clearing, but not the excessive, wet coughing that liquids were eliciting on Friday 2/4. Anderson Malta was shown how to use the EMST device - pt with improved expiratory effort.  Resistance was increased to 16 and pt engaged in three sets of five repetitions with min visual/verbal cues needed to improve accuracy. Encouraged pt to complete five sets of five reps daily.  MBS video was pulled up so that Anderson Malta could see the results of MBS from last week and so that we could discuss the impact of Parkinson's disease and acute medical situation on his swallowing. She asked insightful questions and verbalized understanding that Mr. Farruggia dysphagia is not a deficit that can be cured, but we can take measures (like EMST) to improve or at least maintain some muscle function and utilize strategies to minimize - but not prevent - aspiration. We also discussed the contraindication of PEG tubes in people with progressive neurological diseases. Pt will benefit from ongoing dysphagia treatment in next level of care.   HPI HPI: 80 y.o. male with medical history significant of CVA, hypertension, GERD, Parkinson's disease, postural hypotension,'s diabetes, hypothyroidism, recent admission for subdural hematoma who presents with some intermittent confusion and difficulty speaking for the past 2 days. Patient was recently admitted from 1/21-1/22 after he had a fall and hit his head and was noted to have a subdural hematoma and associated slurred speech.  A  repeat CT the following day was stable and there was no indication for surgical intervention and his slurred speech was improving so he was discharged home.  He was discharged on Keppra due to a 15-second seizure-like episode in the ED during that admission.  He has been taking all prescribed medications including Keppra at home. Repeat CT of head this admission showed There is now a thin amount of acute-appearing subdural hemorrhage seen overlying the LEFT tentorium, likely interval extension from the aforementioned hemorrhage overlying the LEFT frontoparietal lobe.      SLP Plan  Continue with current plan of care       Recommendations  Diet recommendations: Dysphagia 3 (mechanical soft);Thin liquid Liquids provided via: Cup Medication Administration: Crushed with puree Supervision: Staff to assist with self feeding;Full supervision/cueing for compensatory strategies Compensations: Small sips/bites;Slow rate;Minimize environmental distractions;Clear throat after each swallow;Multiple dry swallows after each bite/sip;Effortful swallow Postural Changes and/or Swallow Maneuvers: Upright 30-60 min after meal;Seated upright 90 degrees                Oral Care Recommendations: Oral care BID Follow up Recommendations: Skilled Nursing facility SLP Visit Diagnosis: Dysphagia, oropharyngeal phase (R13.12) Plan: Continue with current plan of care       GO                Juan Quam Laurice 07/26/2020, 3:47 PM  Alessa Mazur L. Tivis Ringer, [place] Office number [phone] Pager [phone]

## 2020-07-26 NOTE — Progress Notes (Addendum)
[facility] PROGRESS NOTE  Patient: Justin Gomez ZYY:[id number]   PCP: Jani Gravel, MD DOB: Jun 01, 1941   DOA: 07/18/2020   DOS: 07/26/2020    Subjective: No acute complaint. No nausea no vomiting. Remains frustrated that he is unable to go to home.  Objective:  Vitals:   07/26/20 1056 07/26/20 1529  BP: 117/72 120/64  Pulse: 94 82  Resp: 16 18  Temp: 97.9 F (36.6 C)   SpO2: 96% 96%    Agitated and anxious. Alert and awake. Clear to auscultation. S1-S2 present.  Assessment and plan: ICH/SDH. Remains stable. Continue current care. Initially on Keppra currently on Depakote. Will need Depakote level check in 1 to 2 weeks. Discussed with wife on the phone. For social worker unable to discharge today as no bed availability until tomorrow. Medically remained stable for discharge.  Author: Berle Mull, MD [facility] 07/26/2020 7:34 PM   If 7PM-7AM, please contact night-coverage at [email or web address]

## 2020-07-27 DIAGNOSIS — Z833 Family history of diabetes mellitus: Secondary | ICD-10-CM | POA: Diagnosis not present

## 2020-07-27 DIAGNOSIS — R41841 Cognitive communication deficit: Secondary | ICD-10-CM | POA: Diagnosis not present

## 2020-07-27 DIAGNOSIS — Z79899 Other long term (current) drug therapy: Secondary | ICD-10-CM | POA: Diagnosis not present

## 2020-07-27 DIAGNOSIS — Z7189 Other specified counseling: Secondary | ICD-10-CM | POA: Diagnosis not present

## 2020-07-27 DIAGNOSIS — Z8249 Family history of ischemic heart disease and other diseases of the circulatory system: Secondary | ICD-10-CM | POA: Diagnosis not present

## 2020-07-27 DIAGNOSIS — S065X0A Traumatic subdural hemorrhage without loss of consciousness, initial encounter: Secondary | ICD-10-CM | POA: Diagnosis not present

## 2020-07-27 DIAGNOSIS — I959 Hypotension, unspecified: Secondary | ICD-10-CM | POA: Diagnosis not present

## 2020-07-27 DIAGNOSIS — G20 Parkinson's disease: Secondary | ICD-10-CM | POA: Diagnosis not present

## 2020-07-27 DIAGNOSIS — E119 Type 2 diabetes mellitus without complications: Secondary | ICD-10-CM | POA: Diagnosis present

## 2020-07-27 DIAGNOSIS — E875 Hyperkalemia: Secondary | ICD-10-CM | POA: Diagnosis present

## 2020-07-27 DIAGNOSIS — R404 Transient alteration of awareness: Secondary | ICD-10-CM | POA: Diagnosis not present

## 2020-07-27 DIAGNOSIS — Z789 Other specified health status: Secondary | ICD-10-CM | POA: Diagnosis not present

## 2020-07-27 DIAGNOSIS — E1140 Type 2 diabetes mellitus with diabetic neuropathy, unspecified: Secondary | ICD-10-CM | POA: Diagnosis not present

## 2020-07-27 DIAGNOSIS — Z9181 History of falling: Secondary | ICD-10-CM | POA: Diagnosis not present

## 2020-07-27 DIAGNOSIS — E039 Hypothyroidism, unspecified: Secondary | ICD-10-CM | POA: Diagnosis present

## 2020-07-27 DIAGNOSIS — R1312 Dysphagia, oropharyngeal phase: Secondary | ICD-10-CM | POA: Diagnosis not present

## 2020-07-27 DIAGNOSIS — R0902 Hypoxemia: Secondary | ICD-10-CM | POA: Diagnosis not present

## 2020-07-27 DIAGNOSIS — Z79890 Hormone replacement therapy (postmenopausal): Secondary | ICD-10-CM | POA: Diagnosis not present

## 2020-07-27 DIAGNOSIS — Z888 Allergy status to other drugs, medicaments and biological substances status: Secondary | ICD-10-CM | POA: Diagnosis not present

## 2020-07-27 DIAGNOSIS — R1310 Dysphagia, unspecified: Secondary | ICD-10-CM | POA: Diagnosis not present

## 2020-07-27 DIAGNOSIS — J90 Pleural effusion, not elsewhere classified: Secondary | ICD-10-CM | POA: Diagnosis not present

## 2020-07-27 DIAGNOSIS — Z66 Do not resuscitate: Secondary | ICD-10-CM | POA: Diagnosis not present

## 2020-07-27 DIAGNOSIS — I951 Orthostatic hypotension: Secondary | ICD-10-CM | POA: Diagnosis not present

## 2020-07-27 DIAGNOSIS — Z20822 Contact with and (suspected) exposure to covid-19: Secondary | ICD-10-CM | POA: Diagnosis present

## 2020-07-27 DIAGNOSIS — I639 Cerebral infarction, unspecified: Secondary | ICD-10-CM | POA: Diagnosis not present

## 2020-07-27 DIAGNOSIS — S065X9A Traumatic subdural hemorrhage with loss of consciousness of unspecified duration, initial encounter: Secondary | ICD-10-CM | POA: Diagnosis not present

## 2020-07-27 DIAGNOSIS — J189 Pneumonia, unspecified organism: Secondary | ICD-10-CM | POA: Diagnosis not present

## 2020-07-27 DIAGNOSIS — E785 Hyperlipidemia, unspecified: Secondary | ICD-10-CM | POA: Diagnosis present

## 2020-07-27 DIAGNOSIS — I629 Nontraumatic intracranial hemorrhage, unspecified: Secondary | ICD-10-CM | POA: Diagnosis not present

## 2020-07-27 DIAGNOSIS — J9811 Atelectasis: Secondary | ICD-10-CM | POA: Diagnosis not present

## 2020-07-27 DIAGNOSIS — E46 Unspecified protein-calorie malnutrition: Secondary | ICD-10-CM | POA: Diagnosis not present

## 2020-07-27 DIAGNOSIS — R350 Frequency of micturition: Secondary | ICD-10-CM | POA: Diagnosis not present

## 2020-07-27 DIAGNOSIS — I2699 Other pulmonary embolism without acute cor pulmonale: Secondary | ICD-10-CM | POA: Diagnosis not present

## 2020-07-27 DIAGNOSIS — Z7984 Long term (current) use of oral hypoglycemic drugs: Secondary | ICD-10-CM | POA: Diagnosis not present

## 2020-07-27 DIAGNOSIS — J690 Pneumonitis due to inhalation of food and vomit: Secondary | ICD-10-CM | POA: Diagnosis present

## 2020-07-27 DIAGNOSIS — I10 Essential (primary) hypertension: Secondary | ICD-10-CM | POA: Diagnosis present

## 2020-07-27 DIAGNOSIS — J9601 Acute respiratory failure with hypoxia: Secondary | ICD-10-CM | POA: Diagnosis present

## 2020-07-27 DIAGNOSIS — Z515 Encounter for palliative care: Secondary | ICD-10-CM | POA: Diagnosis not present

## 2020-07-27 DIAGNOSIS — Z781 Physical restraint status: Secondary | ICD-10-CM | POA: Diagnosis not present

## 2020-07-27 DIAGNOSIS — M6281 Muscle weakness (generalized): Secondary | ICD-10-CM | POA: Diagnosis not present

## 2020-07-27 DIAGNOSIS — R32 Unspecified urinary incontinence: Secondary | ICD-10-CM | POA: Diagnosis not present

## 2020-07-27 DIAGNOSIS — J9621 Acute and chronic respiratory failure with hypoxia: Secondary | ICD-10-CM | POA: Diagnosis not present

## 2020-07-27 DIAGNOSIS — I313 Pericardial effusion (noninflammatory): Secondary | ICD-10-CM | POA: Diagnosis not present

## 2020-07-27 DIAGNOSIS — K219 Gastro-esophageal reflux disease without esophagitis: Secondary | ICD-10-CM | POA: Diagnosis present

## 2020-07-27 DIAGNOSIS — Z741 Need for assistance with personal care: Secondary | ICD-10-CM | POA: Diagnosis not present

## 2020-07-27 DIAGNOSIS — R0602 Shortness of breath: Secondary | ICD-10-CM | POA: Diagnosis not present

## 2020-07-27 DIAGNOSIS — N179 Acute kidney failure, unspecified: Secondary | ICD-10-CM | POA: Diagnosis not present

## 2020-07-27 DIAGNOSIS — R0689 Other abnormalities of breathing: Secondary | ICD-10-CM | POA: Diagnosis not present

## 2020-07-27 MED ORDER — DEXTROMETHORPHAN POLISTIREX ER 30 MG/5ML PO SUER: 15.0000 mg | Freq: Two times a day (BID) | ORAL | 0 refills | Status: AC | PRN

## 2020-07-27 MED ORDER — DIVALPROEX SODIUM 125 MG PO CSDR
500.0000 mg | DELAYED_RELEASE_CAPSULE | Freq: Two times a day (BID) | ORAL | 0 refills | Status: AC
Start: 2020-07-27 — End: ?

## 2020-07-27 NOTE — TOC Transition Note (Signed)
Transition of Care [facility]) - CM/SW Discharge Note   Patient Details  Name: [name] MRN: [id number] Date of Birth: 14-Oct-1940  [facility] CM/SW Contact:  Marney Setting, [place] Work Phone Number: 07/27/2020, 10:40 AM   Clinical Narrative:   Nurse to call report to [phone]. Rm [phone]    Final next level of care: Skilled Nursing Facility Barriers to Discharge: No Barriers Identified   Patient Goals and CMS Choice Patient states their goals for this hospitalization and ongoing recovery are:: PT unable to participate only oriented to self CMS Medicare.gov Compare Post Acute Care list provided to:: Patient Represenative (must comment) Choice offered to / list presented to : Spouse  Discharge Placement              Patient chooses bed at: [facility] Patient to be transferred to facility by: [place] Name of family member notified: Hoyle Sauer Patient and family notified of of transfer: 07/27/20  Discharge Plan and Services     Post Acute Care Choice: [place]                               Social Determinants of Health (SDOH) Interventions     Readmission Risk Interventions No flowsheet data found.

## 2020-07-27 NOTE — Discharge Summary (Signed)
[facility] Discharge Summary   Patient: Justin Gomez HWE:[id number]  PCP: Jani Gravel, MD  Date of admission: 07/18/2020   Date of discharge:  07/27/2020     Discharge Diagnoses:  Principal Problem:   Intracranial bleeding ([place]) Active Problems:   Essential hypertension   Type 2 diabetes mellitus without complication, without long-term current use of insulin (HCC)   Gastroesophageal reflux disease   Postural hypotension   Cerebral thrombosis with cerebral infarction   Parkinson's disease   SDH (subdural hematoma) (HCC)   ICH (intracerebral hemorrhage) ([place])   Admitted From: home Disposition:  SNF   Recommendations for Outpatient Follow-up:  1. PCP: please follow up with PCP in 1 week and Neurology in 4-6 weeks. 2. Follow up LABS/TEST:  Depakote level   Follow-up Information    Jani Gravel, MD. Schedule an appointment as soon as possible for a visit in 1 week(s).   Specialty: Internal Medicine Why: Depakote level in 1 week Contact information: [place] [address] [phone]        [facility]. Schedule an appointment as soon as possible for a visit in 6 week(s).   Specialty: Neurology Contact information: [address] [place] [phone]             Discharge Instructions    Ambulatory referral to Neurology   Complete by: As directed    An appointment is requested in approximately: 4 weeks   DIET DYS 3   Complete by: As directed    Diet recommendations: Dysphagia 3 (mechanical soft);Thin liquid Liquids provided via: Cup Medication Administration: Crushed with puree Supervision: Staff to assist with self feeding;Full supervision/cueing for compensatory strategies Compensations: Small sips/bites;Slow rate;Minimize environmental distractions;Clear throat after each swallow;Multiple dry swallows after each bite/sip;Effortful swallow Postural Changes and/or Swallow  Maneuver   Fluid consistency: Thin   Increase activity slowly   Complete by: As directed       Diet recommendation: Dysphagia type 3 thin Liquid  Activity: The patient is advised to gradually reintroduce usual activities, as tolerated  Discharge Condition: stable  Code Status: Full code   History of present illness: As per the H and P dictated on admission, "[name] is a 80 y.o. male with medical history significant of CVA, hypertension, GERD, Parkinson's disease, postural hypotension,'s diabetes, hypothyroidism, recent admission for subdural hematoma who presents with some intermittent confusion and difficulty speaking for the past 2 days.             Patient was recently admitted from 1/21-1/22 after he had a fall and hit his head and was noted to have a subdural hematoma and associated slurred speech.  A repeat CT the following day was stable and there was no indication for surgical intervention and his slurred speech was improving so he was discharged home.  He was discharged on Keppra due to a 15-second seizure-like episode in the ED during that admission.  He has been taking all prescribed medications including Keppra at home.             Patient reports 2 days of increased intermittent confusion and difficulty speaking as above he states it has been there more often than not.  His wife reported  episodes where he tried to use a rug to clean himself after dribbling urine in the bathroom as well as putting things in the freezer which he should not.  He is also has intermittent dysarthria and word finding  difficulty as above. Reports chronic cough and chronic frequent urination and urinary incontinence.  Denies fevers, chills, chest pain, shortness of breath, abdominal pain, constipation, nausea, vomiting, diarrhea.  ED Course: In the ED vitals significant for blood pressure in the 468E to 321 systolic.  Lab work-up showed normal CMP except for glucose 124 and protein 6.3.  CBC showed  hemoglobin 11.8 which is stable.  PT, PTT INR normal.  Respiratory panel for Covid pending.  CT head showed a slight decrease of subdural hematoma at the left frontal parietal region which as above is decreased from 1/22.  There is a new thin acute subdural hematoma at the left tentorium which is consistent with an extension of the previous bleed.  No new/acute bleed suspected.  Neurosurgery consulted by EDP who states that due to this being an extension and not significant in size there is no indication for operative intervention, some irritation may be causing his waxing and waning symptoms.  Neurology was consulted and recommended observation and EEG."  Hospital Course:  Summary of his active problems in the hospital is as following. Intracranial bleeding with recent subdural hematoma Acute onset delirium/behavioral changes/encephalopathy, resolving Rule out atypical seizure/todds paralysis Rule out polypharmacy At baseline per wife prior to fall and brain bleed no speech deficits; standup/walked without assistance; minimal help with dressing; AOx4 with 'sharp mind' but occasionally became confused or misplaced items frequently Patient's mental status is improving drastically compared to admission, he is much more awake alert, cooperative. Waxing and waning speech Neurologysigning off,  EEG shows diffuse encephalopathy without epileptiform discharges, discontinued keppra 07/21/20 and started [id number] BID  outpatient follow up in 4-6 weeks  Chronic Parkinson's disease Continue home Sinemet  Urinary frequency and incotninence Continue finasteride,cannot continue Myrbetriq -cannot be crushed.  Diabetes On  Metformin Changed to non-XR formula so it can be crushed  Hypertension Continue home losartan  History of CVA Continue home atorvastatin  GERD Continue home PPI  Hypothyroidism Continue home Synthroid  Patient was seen by physical therapy, who recommended SNF,  . On the day of the discharge the patient's vitals were stable, and no other acute medical condition were reported by patient. The patient was felt safe to be discharge at SNF with Therapy.  Consultants: Neurology  Procedures: none  DISCHARGE MEDICATION: Allergies as of 07/27/2020      Reactions   Benzonatate Anaphylaxis   Closed throat; difficulty swallowing      Medication List    STOP taking these medications   levETIRAcetam 500 MG tablet Commonly known as: KEPPRA     TAKE these medications   atorvastatin 20 MG tablet Commonly known as: LIPITOR Take 1 tablet (20 mg total) by mouth daily at 6 PM.   carbidopa-levodopa 25-100 MG tablet Commonly known as: SINEMET IR Take 2 tablets by mouth 3 (three) times daily.   dextromethorphan 30 MG/5ML liquid Commonly known as: DELSYM Take 2.5 mLs (15 mg total) by mouth 2 (two) times daily as needed for cough.   divalproex 125 MG capsule Commonly known as: DEPAKOTE SPRINKLE Take 4 capsules (500 mg total) by mouth 2 (two) times daily.   finasteride 5 MG tablet Commonly known as: PROSCAR Take 5 mg by mouth daily.   levothyroxine 75 MCG tablet Commonly known as: SYNTHROID Take 75 mcg by mouth daily.   losartan 25 MG tablet Commonly known as: COZAAR Take 25 mg by mouth daily.   metFORMIN 750 MG 24 hr tablet Commonly known as: GLUCOPHAGE-XR Take 750 mg by mouth  2 (two) times daily.   mirabegron ER 50 MG Tb24 tablet Commonly known as: MYRBETRIQ Take 50 mg by mouth daily.   pantoprazole 40 MG tablet Commonly known as: PROTONIX Take 40 mg by mouth daily.       Discharge Exam: There were no vitals filed for this visit. Vitals:   07/27/20 0743 07/27/20 0911  BP: (!) 157/84 138/74  Pulse: 80 80  Resp: 18   Temp: 98 F (36.7 C)   SpO2: 95%    General: Appear in mild distress, no Rash; Oral Mucosa Clear, moist. no Abnormal Neck Mass Or lumps, Conjunctiva normal  Cardiovascular: S1 and S2 Present, no  Murmur, Respiratory: good respiratory effort, Bilateral Air entry present and CTA, no Crackles, no wheezes Abdomen: Bowel Sound present, Soft and no tenderness Extremities: no Pedal edema Neurology: alert and oriented to time, place, and person affect appropriate. no new focal deficit Gait not checked due to patient safety concerns  The results of significant diagnostics from this hospitalization (including imaging, microbiology, ancillary and laboratory) are listed below for reference.    Significant Diagnostic Studies: CT HEAD WO CONTRAST  Result Date: 07/21/2020 CLINICAL DATA:  Acute neuro deficit. Right facial droop. History of subdural hematoma. EXAM: CT HEAD WITHOUT CONTRAST TECHNIQUE: Contiguous axial images were obtained from the base of the skull through the vertex without intravenous contrast. COMPARISON:  07/18/2020 FINDINGS: Brain: Small persistent but fading left-sided subdural hematoma with maximum diameter of 5 mm. This previously 7.5 mm. Stable subacute subdural fluid more anteriorly in the left frontal area. Stable subdural blood or layering subarachnoid blood along the left tentorium. No progressive findings. No findings for new/acute hemorrhage when compared to the prior study. The ventricles are in the midline without mass effect or shift. Stable age related cerebral atrophy, ventriculomegaly and periventricular white matter disease. Vascular: Stable vascular calcifications. No aneurysm or hyperdense vessels. Skull: No skull fracture or bone lesions. Sinuses/Orbits: The paranasal sinuses and mastoid air cells are clear. The globes are intact. Other: No scalp lesions or hematoma. IMPRESSION: 1. Slight interval decrease in size of the left-sided subdural hematoma. 2. Stable subacute subdural fluid more anteriorly in the left frontal area. 3. Stable subdural blood or layering subarachnoid blood along the left tentorium. 4. Stable age related cerebral atrophy, ventriculomegaly and  periventricular white matter disease. Electronically Signed   By: Marijo Sanes M.D.   On: 07/21/2020 13:47   CT HEAD WO CONTRAST  Result Date: 07/18/2020 CLINICAL DATA:  Headache. Intracranial hemorrhage suspected. Fall last week, with slurred speech and difficulty speaking since fall. EXAM: CT HEAD WITHOUT CONTRAST TECHNIQUE: Contiguous axial images were obtained from the base of the skull through the vertex without intravenous contrast. COMPARISON:  Head CT dated 07/10/2020 FINDINGS: Brain: Ventricles are stable in size and configuration. Mild chronic small vessel ischemic changes noted within the bilateral periventricular white matter regions. Slight decrease in size of the subdural hemorrhage overlying the LEFT frontoparietal lobe compared to head CT of 07/10/2020, and also decreased in density per expected aging (now subacute in appearance). There is now a thin amount of acute-appearing subdural hemorrhage overlying the LEFT tentorium, likely extension from the aforementioned hemorrhage overlying the LEFT frontoparietal lobe. No evidence of additional acute intracranial hemorrhage. No midline shift or herniation. Vascular: Chronic calcified atherosclerotic changes of the large vessels at the skull base. Focal heavy atherosclerotic calcification of the LEFT upper vertebral artery, versus stent. No unexpected hyperdense vessel. Skull: Normal. Negative for fracture or focal lesion. Sinuses/Orbits: No  acute finding. Other: Fairly large amount of cerumen within the external auditory canal regions bilaterally. IMPRESSION: 1. Slight decrease in size of the subdural hemorrhage overlying the LEFT frontoparietal lobe compared to head CT of 07/10/2020, and also decreased in density per expected aging (now subacute in appearance). Mild mass effect with sulcal effacement in the LEFT frontoparietal lobe, but no midline shift or herniation. 2. There is now a thin amount of acute-appearing subdural hemorrhage seen  overlying the LEFT tentorium, likely interval extension from the aforementioned hemorrhage overlying the LEFT frontoparietal lobe. 3. No evidence of additional acute intracranial hemorrhage. 4. Mild chronic small vessel ischemic changes within the white matter. Electronically Signed   By: Franki Cabot M.D.   On: 07/18/2020 12:01   CT HEAD WO CONTRAST  Result Date: 07/10/2020 CLINICAL DATA:  Follow-up subdural hemorrhage EXAM: CT HEAD WITHOUT CONTRAST TECHNIQUE: Contiguous axial images were obtained from the base of the skull through the vertex without intravenous contrast. COMPARISON:  Yesterday FINDINGS: Brain: High-density subdural hematoma along the left cerebral convexity measuring up to 8 mm in thickness. Mild cortical mass effect. No interval growth. No new site of hemorrhage. No visible infarct, hydrocephalus, or mass. Vascular: No hyperdense vessel or unexpected calcification. Skull: Normal. Negative for fracture or focal lesion. Sinuses/Orbits: No acute finding. IMPRESSION: No progression of the left-sided subdural hematoma. Mass effect remains mild. Electronically Signed   By: Monte Fantasia M.D.   On: 07/10/2020 06:39   CT HEAD WO CONTRAST  Result Date: 07/09/2020 CLINICAL DATA:  Slurred speech EXAM: CT HEAD WITHOUT CONTRAST CT CERVICAL SPINE WITHOUT CONTRAST TECHNIQUE: Multidetector CT imaging of the head and cervical spine was performed following the standard protocol without intravenous contrast. Multiplanar CT image reconstructions of the cervical spine were also generated. COMPARISON:  CT brain and cervical spine 07/14/2017, MRI 07/15/2017 FINDINGS: CT HEAD FINDINGS Brain: No acute territorial infarction or intracranial mass is visualized. Acute left convexity subdural hematoma measuring up to 10 mm maximum thickness. No significant midline shift. Mild atrophy. Stable ventricle size. Vascular: No hyperdense vessels. Vertebral and carotid vascular calcification Skull: Normal. Negative for  fracture or focal lesion. Sinuses/Orbits: No acute finding. Other: None CT CERVICAL SPINE FINDINGS Alignment: Straightening of the cervical spine.  No subluxation. Skull base and vertebrae: No acute fracture. No primary bone lesion or focal pathologic process. Soft tissues and spinal canal: No prevertebral fluid or swelling. No visible canal hematoma. Disc levels: Bulky flowing anterior osteophytes C3 through T1 with ankylosis, consistent with dish. Mild disc space narrowing at C2-C3, C4-C5, C5-C6 and C6-C7. Facet degenerative changes at multiple levels. Upper chest: Minimal apical scarring. Other: None IMPRESSION: 1. Acute left convexity subdural hematoma measuring up to 10 mm maximum thickness. No significant midline shift. 2. 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 No acute osseous abnormality. Critical Value/emergent results were called by telephone at the time of interpretation on 07/09/2020 at 5:05 pm to provider DAVID YAO , who verbally acknowledged these results. Electronically Signed   By: Donavan Foil M.D.   On: 07/09/2020 17:06   CT Cervical Spine Wo Contrast  Result Date: 07/09/2020 CLINICAL DATA:  Slurred speech EXAM: CT HEAD WITHOUT CONTRAST CT CERVICAL SPINE WITHOUT CONTRAST TECHNIQUE: Multidetector CT imaging of the head and cervical spine was performed following the standard protocol without intravenous contrast. Multiplanar CT image reconstructions of the cervical spine were also generated. COMPARISON:  CT brain and cervical spine 07/14/2017, MRI 07/15/2017 FINDINGS: CT HEAD  FINDINGS Brain: No acute territorial infarction or intracranial mass is visualized. Acute left convexity subdural hematoma measuring up to 10 mm maximum thickness. No significant midline shift. Mild atrophy. Stable ventricle size. Vascular: No hyperdense vessels. Vertebral and carotid vascular calcification Skull: Normal. Negative for fracture or focal  lesion. Sinuses/Orbits: No acute finding. Other: None CT CERVICAL SPINE FINDINGS Alignment: Straightening of the cervical spine.  No subluxation. Skull base and vertebrae: No acute fracture. No primary bone lesion or focal pathologic process. Soft tissues and spinal canal: No prevertebral fluid or swelling. No visible canal hematoma. Disc levels: Bulky flowing anterior osteophytes C3 through T1 with ankylosis, consistent with dish. Mild disc space narrowing at C2-C3, C4-C5, C5-C6 and C6-C7. Facet degenerative changes at multiple levels. Upper chest: Minimal apical scarring. Other: None IMPRESSION: 1. Acute left convexity subdural hematoma measuring up to 10 mm maximum thickness. No significant midline shift. 2. 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 No acute osseous abnormality. Critical Value/emergent results were called by telephone at the time of interpretation on 07/09/2020 at 5:05 pm to provider DAVID YAO , who verbally acknowledged these results. Electronically Signed   By: Donavan Foil M.D.   On: 07/09/2020 17:06   MR BRAIN WO CONTRAST  Result Date: 07/09/2020 CLINICAL DATA:  Slurred speech, prior fall. EXAM: MRI HEAD WITHOUT CONTRAST TECHNIQUE: Multiplanar, multiecho pulse sequences of the brain and surrounding structures were obtained without intravenous contrast. COMPARISON:  07/09/2020 and prior. FINDINGS: Brain: No acute infarct. Acute left cerebral convexity subdural hematoma measuring up to 1.0 cm is unchanged. No significant mass effect or parenchymal edema. No midline shift or ventriculomegaly. Mild cerebral atrophy with ex vacuo dilatation. Tiny remote right cerebellar lacunar insult. Vascular: Normal flow voids. Skull and upper cervical spine: Normal marrow signal. Sinuses/Orbits: No acute orbital finding. Pneumatized paranasal sinuses. Trace left mastoid free fluid. Other: None. IMPRESSION: Unchanged acute left cerebral  convexity subdural hematoma measuring 1.0 cm. Mild cerebral atrophy.  Tiny remote right cerebellar lacunar insult. Electronically Signed   By: Primitivo Gauze M.D.   On: 07/09/2020 20:11   DG Chest Port 1 View  Result Date: 07/09/2020 CLINICAL DATA:  Slurred speech EXAM: PORTABLE CHEST 1 VIEW COMPARISON:  10/17/2009 FINDINGS: The heart size and mediastinal contours are within normal limits. Both lungs are clear. The visualized skeletal structures are unremarkable. IMPRESSION: No active disease. Electronically Signed   By: Kathreen Devoid   On: 07/09/2020 15:07   DG Swallowing Func-Speech Pathology  Result Date: 07/22/2020 Objective Swallowing Evaluation: Type of Study: MBS-Modified Barium Swallow Study  Patient Details Name: DONYA TOMARO MRN: [id number] Date of Birth: August 05, 1940 Today's Date: 07/22/2020 Time: SLP Start Time (ACUTE ONLY): 1100 -SLP Stop Time (ACUTE ONLY): 1200 SLP Time Calculation (min) (ACUTE ONLY): 60 min Past Medical History: Past Medical History: Diagnosis Date . BPH (benign prostatic hypertrophy)  . Cerebral thrombosis with cerebral infarction 07/16/2017 . 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 Diverticulosis  . Essential hypertension 11/29/2015 . Gastroesophageal reflux disease 04/13/2016 . Hiatal hernia  . Hyperlipidemia  . Hypothyroidism  . Lumbar herniated disc   L4 . Mild neurocognitive disorder, unclear etiology 09/24/2019 . Multiple system atrophy  . Parkinson's disease 07/31/2017 . Postural dizziness with near syncope 07/14/2017 . Postural hypotension 01/22/2017 . Subdural hematoma 07/15/2017 . Type 2 diabetes mellitus without complication, without long-term current use of insulin ([place]) 11/29/2015 Past Surgical History: Past Surgical History: Procedure Laterality Date . APPENDECTOMY  1956 . KNEE SURGERY  2009  right . SHOULDER SURGERY  2010  left HPI: 80 y.o. male with medical history significant of CVA, hypertension, GERD, Parkinson's disease, postural hypotension,'s diabetes,  hypothyroidism, recent admission for subdural hematoma who presents with some intermittent confusion and difficulty speaking for the past 2 days. Patient was recently admitted from 1/21-1/22 after he had a fall and hit his head and was noted to have a subdural hematoma and associated slurred speech.  A repeat CT the following day was stable and there was no indication for surgical intervention and his slurred speech was improving so he was discharged home.  He was discharged on Keppra due to a 15-second seizure-like episode in the ED during that admission.  He has been taking all prescribed medications including Keppra at home. Repeat CT of head this admission showed There is now a thin amount of acute-appearing subdural hemorrhage seen overlying the LEFT tentorium, likely interval extension from the aforementioned hemorrhage overlying the LEFT frontoparietal lobe.  Subjective: alert, upright in chair for procedure Assessment / Plan / Recommendation CHL IP CLINICAL IMPRESSIONS 07/22/2020 Clinical Impression Pt presents with an acute on chronic dysphagia, suspected to be exacerbated by recent hospital stays and in setting of PD. Mild oral dysphagia was exhbited and moderate to severe pharyngeal dysphagia was noted. Oral deficits included delayed AP transit, mild oral residuals, and reduced bolus cohesion with mechanical soft textures. Global pharyngeal deficits included reduced base of tongue retraction, poor epiglottic deflection (likely impacted by protrusion of posterior pharyngeal wall from cervical osteophyte), reduced laryngeal elevation, poor UES opening, reduced laryngeal vestibule and glottic closure, and diminished sensation. This allowed for frequent pre and during the swallow penetration of ALL consistencies tested with episodic aspiration. Gross silent aspiration noted with thins via straw use. Smaller amounts of aspiration noted with nectar and honey thick liquids from post swallow residuals. Residual  amounts noted with all PO, moderate in the valleculae and pyriform sinuses. Pt dysphagia has progressed since last objective swallow study in 06/2019. SLP discussed with pt and spouse, notified MD. Aspiration risk cannot be eliminated with PO diet. Pt and spouse wish to continue PO diet with known risk and use of safe swallowing strategies, oropharyngeal rehabilitation with SLP, and diligent oral care. SLP to closely monitor. Recommend dysphagia 3 (mechanical soft) and thin liquids with no straws and medicines crushed in puree.   SLP Visit Diagnosis Dysphagia, oropharyngeal phase (R13.12) Attention and concentration deficit following -- Frontal lobe and executive function deficit following -- Impact on safety and function Moderate aspiration risk;Severe aspiration risk   CHL IP TREATMENT RECOMMENDATION 07/22/2020 Treatment Recommendations Therapy as outlined in treatment plan below   Prognosis 07/22/2020 Prognosis for Safe Diet Advancement Fair Barriers to Reach Goals Severity of deficits Barriers/Prognosis Comment -- CHL IP DIET RECOMMENDATION 07/22/2020 SLP Diet Recommendations Thin liquid;Dysphagia 3 (Mech soft) solids Liquid Administration via Cup;No straw Medication Administration -- Compensations Small sips/bites;Slow rate;Minimize environmental distractions;Clear throat after each swallow;Multiple dry swallows after each bite/sip;Effortful swallow Postural Changes Remain semi-upright after after feeds/meals (Comment);Seated upright at 90 degrees   CHL IP OTHER RECOMMENDATIONS 07/22/2020 Recommended Consults -- Oral Care Recommendations Oral care BID;Oral care before and after PO Other Recommendations --   [facility] IP FOLLOW UP RECOMMENDATIONS 07/22/2020 Follow up Recommendations Skilled Nursing facility   [facility] IP FREQUENCY AND DURATION 07/22/2020 Speech Therapy Frequency (ACUTE ONLY) min 2x/week Treatment Duration 2 weeks      CHL IP ORAL PHASE 07/22/2020 Oral Phase Impaired Oral - Pudding Teaspoon -- Oral - Pudding  Cup -- Oral  - Honey Teaspoon -- Oral - Honey Cup Weak lingual manipulation;Lingual/palatal residue;Reduced posterior propulsion;Delayed oral transit Oral - Nectar Teaspoon -- Oral - Nectar Cup Weak lingual manipulation;Reduced posterior propulsion;Delayed oral transit;Lingual/palatal residue Oral - Nectar Straw -- Oral - Thin Teaspoon -- Oral - Thin Cup Delayed oral transit;Reduced posterior propulsion;Weak lingual manipulation;Lingual/palatal residue Oral - Thin Straw Delayed oral transit;Incomplete tongue to palate contact Oral - Puree Lingual/palatal residue;Delayed oral transit;Weak lingual manipulation Oral - Mech Soft Impaired mastication;Weak lingual manipulation;Reduced posterior propulsion;Delayed oral transit;Lingual/palatal residue Oral - Regular -- Oral - Multi-Consistency -- Oral - Pill Reduced posterior propulsion;Holding of bolus;Other (Comment) Oral Phase - Comment --  [facility] IP PHARYNGEAL PHASE 07/22/2020 Pharyngeal Phase Impaired Pharyngeal- Pudding Teaspoon -- Pharyngeal -- Pharyngeal- Pudding Cup -- Pharyngeal -- Pharyngeal- Honey Teaspoon -- Pharyngeal -- Pharyngeal- Honey Cup Reduced epiglottic inversion;Reduced anterior laryngeal mobility;Reduced laryngeal elevation;Reduced airway/laryngeal closure;Reduced pharyngeal peristalsis;Penetration/Aspiration before swallow;Penetration/Aspiration during swallow;Trace aspiration;Pharyngeal residue - valleculae;Pharyngeal residue - pyriform;Pharyngeal residue - posterior pharnyx Pharyngeal Material enters airway, remains ABOVE vocal cords and not ejected out;Material enters airway, passes BELOW cords without attempt by patient to eject out (silent aspiration) Pharyngeal- Nectar Teaspoon -- Pharyngeal -- Pharyngeal- Nectar Cup Delayed swallow initiation-pyriform sinuses;Reduced pharyngeal peristalsis;Reduced epiglottic inversion;Reduced anterior laryngeal mobility;Reduced laryngeal elevation;Reduced airway/laryngeal closure;Reduced tongue base  retraction;Penetration/Aspiration before swallow;Penetration/Aspiration during swallow;Pharyngeal residue - valleculae;Pharyngeal residue - pyriform Pharyngeal Material enters airway, remains ABOVE vocal cords and not ejected out;Material enters airway, passes BELOW cords without attempt by patient to eject out (silent aspiration) Pharyngeal- Nectar Straw -- Pharyngeal -- Pharyngeal- Thin Teaspoon -- Pharyngeal -- Pharyngeal- Thin Cup Delayed swallow initiation-pyriform sinuses;Reduced pharyngeal peristalsis;Reduced epiglottic inversion;Reduced anterior laryngeal mobility;Reduced laryngeal elevation;Reduced airway/laryngeal closure;Reduced tongue base retraction;Penetration/Aspiration before swallow;Penetration/Aspiration during swallow;Pharyngeal residue - valleculae;Pharyngeal residue - pyriform Pharyngeal Material enters airway, passes BELOW cords without attempt by patient to eject out (silent aspiration);Material enters airway, remains ABOVE vocal cords and not ejected out Pharyngeal- Thin Straw Delayed swallow initiation-pyriform sinuses;Reduced pharyngeal peristalsis;Reduced epiglottic inversion;Reduced anterior laryngeal mobility;Reduced laryngeal elevation;Reduced airway/laryngeal closure;Reduced tongue base retraction;Penetration/Aspiration before swallow;Penetration/Aspiration during swallow;Significant aspiration (Amount);Pharyngeal residue - pyriform;Pharyngeal residue - valleculae Pharyngeal Material enters airway, passes BELOW cords without attempt by patient to eject out (silent aspiration) Pharyngeal- Puree Delayed swallow initiation-vallecula;Reduced pharyngeal peristalsis;Reduced epiglottic inversion;Reduced anterior laryngeal mobility;Reduced laryngeal elevation;Reduced airway/laryngeal closure;Reduced tongue base retraction;Penetration/Aspiration before swallow;Penetration/Aspiration during swallow;Pharyngeal residue - valleculae;Pharyngeal residue - pyriform Pharyngeal Material enters airway,  remains ABOVE vocal cords and not ejected out Pharyngeal- Mechanical Soft Delayed swallow initiation-vallecula;Reduced pharyngeal peristalsis;Reduced epiglottic inversion;Reduced anterior laryngeal mobility;Reduced airway/laryngeal closure;Reduced laryngeal elevation;Reduced tongue base retraction;Penetration/Aspiration before swallow;Penetration/Aspiration during swallow;Pharyngeal residue - valleculae;Pharyngeal residue - pyriform Pharyngeal -- Pharyngeal- Regular -- Pharyngeal -- Pharyngeal- Multi-consistency -- Pharyngeal -- Pharyngeal- Pill Other (Comment) Pharyngeal -- Pharyngeal Comment --  CHL IP CERVICAL ESOPHAGEAL PHASE 07/22/2020 Cervical Esophageal Phase Impaired Pudding Teaspoon -- Pudding Cup -- Honey Teaspoon -- Honey Cup Reduced cricopharyngeal relaxation;Prominent cricopharyngeal segment Nectar Teaspoon -- Nectar Cup Reduced cricopharyngeal relaxation;Prominent cricopharyngeal segment Nectar Straw -- Thin Teaspoon -- Thin Cup Reduced cricopharyngeal relaxation;Prominent cricopharyngeal segment Thin Straw Reduced cricopharyngeal relaxation;Prominent cricopharyngeal segment Puree Reduced cricopharyngeal relaxation;Prominent cricopharyngeal segment Mechanical Soft Reduced cricopharyngeal relaxation;Prominent cricopharyngeal segment Regular -- Multi-consistency -- Pill -- Cervical Esophageal Comment -- Hayden Rasmussen MA, CCC-SLP [facility] 07/22/2020, 12:03 PM              EEG adult  Result Date: 07/19/2020 Lora Havens, MD     07/19/2020  2:22 PM Patient Name: JAESHAWN SILVIO MRN: [id number]  Epilepsy Attending: Lora Havens Referring Physician/Provider: Date: 07/19/2020 Duration: 23.45 mins Patient history: 80 year old male with enlarging subdural hematoma and worsened confusion. EEG to evaluate for seizure Level of alertness: Awake AEDs during EEG study: LEV Technical aspects: This EEG study was done with scalp electrodes positioned according to the 10-20 International system of  electrode placement. Electrical activity was acquired at a sampling rate of 500Hz  and reviewed with a high frequency filter of 70Hz  and a low frequency filter of 1Hz . EEG data were recorded continuously and digitally stored. Description: No posterior dominant rhythm was seen. EEG showed continuous generalized and maximal left tempora region5-7hz  theta as well as 2-3 hz delta slowing.  Hyperventilation and photic stimulation were not performed.   ABNORMALITY -Continuous slow, generalized IMPRESSION: This study is suggestive of cortical dysfunction in left temporal region likely secondary to underlying bleed. There is also moderate diffuse encephalopathy, nonspecific etiology. No seizures or epileptiform discharges were seen throughout the recording. Lora Havens    Microbiology: Recent Results (from the past 240 hour(s))  SARS CORONAVIRUS 2 (TAT 6-24 HRS) Nasopharyngeal Nasopharyngeal Swab     Status: None   Collection Time: 07/18/20  4:53 PM   Specimen: Nasopharyngeal Swab  Result Value Ref Range Status   SARS Coronavirus 2 NEGATIVE NEGATIVE Final    Comment: (NOTE) SARS-CoV-2 target nucleic acids are NOT DETECTED.  The SARS-CoV-2 RNA is generally detectable in upper and lower respiratory specimens during the acute phase of infection. Negative results do not preclude SARS-CoV-2 infection, do not rule out co-infections with other pathogens, and should not be used as the sole basis for treatment or other patient management decisions. Negative results must be combined with clinical observations, patient history, and epidemiological information. The expected result is Negative.  Fact Sheet for Patients: SugarRoll.be  Fact Sheet for Healthcare Providers: https://www.woods-mathews.com/  This test is not yet approved or cleared by the [place] FDA and  has been authorized for detection and/or diagnosis of SARS-CoV-2 by FDA under an Emergency Use  Authorization (EUA). This EUA will remain  in effect (meaning this test can be used) for the duration of the COVID-19 declaration under Se ction 564(b)(1) of the Act, 21 U.S.C. section 360bbb-3(b)(1), unless the authorization is terminated or revoked sooner.  Performed at [facility], [place] [address], [place], [place] [address]   SARS CORONAVIRUS 2 (TAT 6-24 HRS) Nasopharyngeal Nasopharyngeal Swab     Status: None   Collection Time: 07/26/20 11:28 AM   Specimen: Nasopharyngeal Swab  Result Value Ref Range Status   SARS Coronavirus 2 NEGATIVE NEGATIVE Final    Comment: (NOTE) SARS-CoV-2 target nucleic acids are NOT DETECTED.  The SARS-CoV-2 RNA is generally detectable in upper and lower respiratory specimens during the acute phase of infection. Negative results do not preclude SARS-CoV-2 infection, do not rule out co-infections with other pathogens, and should not be used as the sole basis for treatment or other patient management decisions. Negative results must be combined with clinical observations, patient history, and epidemiological information. The expected result is Negative.  Fact Sheet for Patients: SugarRoll.be  Fact Sheet for Healthcare Providers: https://www.woods-mathews.com/  This test is not yet approved or cleared by the [place] FDA and  has been authorized for detection and/or diagnosis of SARS-CoV-2 by FDA under an Emergency Use Authorization (EUA). This EUA will remain  in effect (meaning this test can be used) for the duration of the COVID-19 declaration under Se ction 564(b)(1) of the Act,  21 U.S.C. section 360bbb-3(b)(1), unless the authorization is terminated or revoked sooner.  Performed at [facility], [place] [address], [place], [place] [address]      Labs: CBC: Recent Labs  Lab 07/21/20 0423 07/22/20 0344 07/23/20 0400 07/24/20 0232 07/25/20 0235  WBC 6.6 5.1 11.3* 7.5 6.5  HGB  10.9* 11.3* 11.7* 10.5* 10.5*  HCT 33.3* 32.4* 34.3* 31.6* 32.2*  MCV 91.5 89.5 89.6 91.1 91.2  PLT 167 175 184 161 754   Basic Metabolic Panel: Recent Labs  Lab 07/21/20 0423 07/22/20 0344 07/23/20 0400 07/24/20 0232 07/25/20 0235  NA 140 142 140 139 141  K 3.5 3.5 3.5 3.4* 3.4*  CL 105 103 101 101 102  CO2 26 28 27 28 28   GLUCOSE 111* 111* 113* 123* 98  BUN 17 18 13 14 15   CREATININE 0.65 0.86 0.70 0.71 0.75  CALCIUM 8.9 8.8* 8.8* 8.5* 8.5*   Liver Function Tests: Recent Labs  Lab 07/21/20 0423 07/22/20 0344 07/23/20 0400 07/24/20 0232 07/25/20 0235  AST 16 15 14* 12* 11*  ALT 6 8 7  <5 6  ALKPHOS 49 53 53 46 44  BILITOT 0.6 0.8 1.0 0.9 0.9  PROT 5.5* 5.6* 5.6* 5.3* 5.5*  ALBUMIN 3.0* 2.9* 3.0* 2.7* 2.7*   CBG: Recent Labs  Lab 07/21/20 1033 07/21/20 2139  GLUCAP 141* 147*    Time spent: 35 minutes  Signed:  Berle Mull  [facility]  07/27/2020 10:18 AM

## 2020-07-28 DIAGNOSIS — I10 Essential (primary) hypertension: Secondary | ICD-10-CM | POA: Diagnosis not present

## 2020-07-28 DIAGNOSIS — E039 Hypothyroidism, unspecified: Secondary | ICD-10-CM | POA: Diagnosis not present

## 2020-07-28 DIAGNOSIS — S065X0A Traumatic subdural hemorrhage without loss of consciousness, initial encounter: Secondary | ICD-10-CM | POA: Diagnosis not present

## 2020-07-28 DIAGNOSIS — G20 Parkinson's disease: Secondary | ICD-10-CM | POA: Diagnosis not present

## 2020-07-28 DIAGNOSIS — I951 Orthostatic hypotension: Secondary | ICD-10-CM | POA: Diagnosis not present

## 2020-07-28 DIAGNOSIS — R350 Frequency of micturition: Secondary | ICD-10-CM | POA: Diagnosis not present

## 2020-07-28 DIAGNOSIS — I639 Cerebral infarction, unspecified: Secondary | ICD-10-CM | POA: Diagnosis not present

## 2020-07-28 DIAGNOSIS — E119 Type 2 diabetes mellitus without complications: Secondary | ICD-10-CM | POA: Diagnosis not present

## 2020-07-28 DIAGNOSIS — K219 Gastro-esophageal reflux disease without esophagitis: Secondary | ICD-10-CM | POA: Diagnosis not present

## 2020-07-30 ENCOUNTER — Ambulatory Visit: Payer: HMO | Admitting: Neurology

## 2020-07-30 DIAGNOSIS — S065X9A Traumatic subdural hemorrhage with loss of consciousness of unspecified duration, initial encounter: Secondary | ICD-10-CM | POA: Diagnosis not present

## 2020-07-30 DIAGNOSIS — I959 Hypotension, unspecified: Secondary | ICD-10-CM | POA: Diagnosis not present

## 2020-07-30 DIAGNOSIS — I639 Cerebral infarction, unspecified: Secondary | ICD-10-CM | POA: Diagnosis not present

## 2020-07-30 DIAGNOSIS — G20 Parkinson's disease: Secondary | ICD-10-CM | POA: Diagnosis not present

## 2020-07-30 DIAGNOSIS — E46 Unspecified protein-calorie malnutrition: Secondary | ICD-10-CM | POA: Diagnosis not present

## 2020-07-30 DIAGNOSIS — R32 Unspecified urinary incontinence: Secondary | ICD-10-CM | POA: Diagnosis not present

## 2020-07-30 DIAGNOSIS — E1140 Type 2 diabetes mellitus with diabetic neuropathy, unspecified: Secondary | ICD-10-CM | POA: Diagnosis not present

## 2020-07-30 DIAGNOSIS — R1310 Dysphagia, unspecified: Secondary | ICD-10-CM | POA: Diagnosis not present

## 2020-07-30 DIAGNOSIS — E039 Hypothyroidism, unspecified: Secondary | ICD-10-CM | POA: Diagnosis not present

## 2020-08-02 ENCOUNTER — Emergency Department (HOSPITAL_COMMUNITY): Payer: HMO

## 2020-08-02 ENCOUNTER — Telehealth: Payer: Self-pay | Admitting: Neurology

## 2020-08-02 ENCOUNTER — Inpatient Hospital Stay (HOSPITAL_COMMUNITY)
Admission: EM | Admit: 2020-08-02 | Discharge: 2020-08-17 | DRG: 177 | Disposition: E | Payer: HMO | Source: Skilled Nursing Facility | Attending: Internal Medicine | Admitting: Internal Medicine

## 2020-08-02 DIAGNOSIS — Z789 Other specified health status: Secondary | ICD-10-CM | POA: Diagnosis not present

## 2020-08-02 DIAGNOSIS — Z7189 Other specified counseling: Secondary | ICD-10-CM

## 2020-08-02 DIAGNOSIS — J9621 Acute and chronic respiratory failure with hypoxia: Secondary | ICD-10-CM | POA: Diagnosis not present

## 2020-08-02 DIAGNOSIS — Z833 Family history of diabetes mellitus: Secondary | ICD-10-CM | POA: Diagnosis not present

## 2020-08-02 DIAGNOSIS — J690 Pneumonitis due to inhalation of food and vomit: Principal | ICD-10-CM | POA: Diagnosis present

## 2020-08-02 DIAGNOSIS — E875 Hyperkalemia: Secondary | ICD-10-CM

## 2020-08-02 DIAGNOSIS — E119 Type 2 diabetes mellitus without complications: Secondary | ICD-10-CM | POA: Diagnosis present

## 2020-08-02 DIAGNOSIS — R404 Transient alteration of awareness: Secondary | ICD-10-CM | POA: Diagnosis not present

## 2020-08-02 DIAGNOSIS — J9811 Atelectasis: Secondary | ICD-10-CM | POA: Diagnosis not present

## 2020-08-02 DIAGNOSIS — R0602 Shortness of breath: Secondary | ICD-10-CM

## 2020-08-02 DIAGNOSIS — I639 Cerebral infarction, unspecified: Secondary | ICD-10-CM | POA: Diagnosis not present

## 2020-08-02 DIAGNOSIS — J189 Pneumonia, unspecified organism: Secondary | ICD-10-CM | POA: Diagnosis not present

## 2020-08-02 DIAGNOSIS — Z7984 Long term (current) use of oral hypoglycemic drugs: Secondary | ICD-10-CM

## 2020-08-02 DIAGNOSIS — Z781 Physical restraint status: Secondary | ICD-10-CM

## 2020-08-02 DIAGNOSIS — Z20822 Contact with and (suspected) exposure to covid-19: Secondary | ICD-10-CM | POA: Diagnosis present

## 2020-08-02 DIAGNOSIS — Z515 Encounter for palliative care: Secondary | ICD-10-CM | POA: Diagnosis not present

## 2020-08-02 DIAGNOSIS — J9601 Acute respiratory failure with hypoxia: Secondary | ICD-10-CM

## 2020-08-02 DIAGNOSIS — N179 Acute kidney failure, unspecified: Secondary | ICD-10-CM

## 2020-08-02 DIAGNOSIS — E785 Hyperlipidemia, unspecified: Secondary | ICD-10-CM | POA: Diagnosis present

## 2020-08-02 DIAGNOSIS — R1310 Dysphagia, unspecified: Secondary | ICD-10-CM

## 2020-08-02 DIAGNOSIS — K219 Gastro-esophageal reflux disease without esophagitis: Secondary | ICD-10-CM | POA: Diagnosis not present

## 2020-08-02 DIAGNOSIS — I959 Hypotension, unspecified: Secondary | ICD-10-CM | POA: Diagnosis not present

## 2020-08-02 DIAGNOSIS — S065XAA Traumatic subdural hemorrhage with loss of consciousness status unknown, initial encounter: Secondary | ICD-10-CM | POA: Diagnosis present

## 2020-08-02 DIAGNOSIS — Z888 Allergy status to other drugs, medicaments and biological substances status: Secondary | ICD-10-CM | POA: Diagnosis not present

## 2020-08-02 DIAGNOSIS — S065X0A Traumatic subdural hemorrhage without loss of consciousness, initial encounter: Secondary | ICD-10-CM | POA: Diagnosis not present

## 2020-08-02 DIAGNOSIS — G20 Parkinson's disease: Secondary | ICD-10-CM

## 2020-08-02 DIAGNOSIS — Z79890 Hormone replacement therapy (postmenopausal): Secondary | ICD-10-CM | POA: Diagnosis not present

## 2020-08-02 DIAGNOSIS — I2699 Other pulmonary embolism without acute cor pulmonale: Secondary | ICD-10-CM | POA: Diagnosis not present

## 2020-08-02 DIAGNOSIS — R0902 Hypoxemia: Secondary | ICD-10-CM | POA: Diagnosis not present

## 2020-08-02 DIAGNOSIS — I10 Essential (primary) hypertension: Secondary | ICD-10-CM | POA: Diagnosis present

## 2020-08-02 DIAGNOSIS — E039 Hypothyroidism, unspecified: Secondary | ICD-10-CM

## 2020-08-02 DIAGNOSIS — Z66 Do not resuscitate: Secondary | ICD-10-CM | POA: Diagnosis not present

## 2020-08-02 DIAGNOSIS — Z79899 Other long term (current) drug therapy: Secondary | ICD-10-CM

## 2020-08-02 DIAGNOSIS — R0689 Other abnormalities of breathing: Secondary | ICD-10-CM | POA: Diagnosis not present

## 2020-08-02 DIAGNOSIS — S065X9A Traumatic subdural hemorrhage with loss of consciousness of unspecified duration, initial encounter: Secondary | ICD-10-CM | POA: Diagnosis not present

## 2020-08-02 DIAGNOSIS — Z8249 Family history of ischemic heart disease and other diseases of the circulatory system: Secondary | ICD-10-CM | POA: Diagnosis not present

## 2020-08-02 DIAGNOSIS — J90 Pleural effusion, not elsewhere classified: Secondary | ICD-10-CM | POA: Diagnosis not present

## 2020-08-02 DIAGNOSIS — I313 Pericardial effusion (noninflammatory): Secondary | ICD-10-CM | POA: Diagnosis not present

## 2020-08-02 LAB — COMPREHENSIVE METABOLIC PANEL
ALT: 13 U/L (ref 0–44)
AST: 20 U/L (ref 15–41)
Albumin: 2.8 g/dL — ABNORMAL LOW (ref 3.5–5.0)
Alkaline Phosphatase: 39 U/L (ref 38–126)
Anion gap: 13 (ref 5–15)
BUN: 57 mg/dL — ABNORMAL HIGH (ref 8–23)
CO2: 26 mmol/L (ref 22–32)
Calcium: 8.5 mg/dL — ABNORMAL LOW (ref 8.9–10.3)
Chloride: 100 mmol/L (ref 98–111)
Creatinine, Ser: 1.38 mg/dL — ABNORMAL HIGH (ref 0.61–1.24)
GFR, Estimated: 52 mL/min — ABNORMAL LOW (ref >60)
Glucose, Bld: 185 mg/dL — ABNORMAL HIGH (ref 70–99)
Potassium: 5.3 mmol/L — ABNORMAL HIGH (ref 3.5–5.1)
Sodium: 139 mmol/L (ref 135–145)
Total Bilirubin: 1.4 mg/dL — ABNORMAL HIGH (ref 0.3–1.2)
Total Protein: 5.9 g/dL — ABNORMAL LOW (ref 6.5–8.1)

## 2020-08-02 LAB — CBC WITH DIFFERENTIAL/PLATELET
Abs Immature Granulocytes: 0.04 10*3/uL (ref 0.00–0.07)
Basophils Absolute: 0 10*3/uL (ref 0.0–0.1)
Basophils Relative: 0 %
Eosinophils Absolute: 0 10*3/uL (ref 0.0–0.5)
Eosinophils Relative: 0 %
HCT: 40.2 % (ref 39.0–52.0)
Hemoglobin: 13.1 g/dL (ref 13.0–17.0)
Immature Granulocytes: 0 %
Lymphocytes Relative: 4 %
Lymphs Abs: 0.4 10*3/uL — ABNORMAL LOW (ref 0.7–4.0)
MCH: 30 pg (ref 26.0–34.0)
MCHC: 32.6 g/dL (ref 30.0–36.0)
MCV: 92 fL (ref 80.0–100.0)
Monocytes Absolute: 0.5 10*3/uL (ref 0.1–1.0)
Monocytes Relative: 5 %
Neutro Abs: 9.2 10*3/uL — ABNORMAL HIGH (ref 1.7–7.7)
Neutrophils Relative %: 91 %
Platelets: 160 10*3/uL (ref 150–400)
RBC: 4.37 MIL/uL (ref 4.22–5.81)
RDW: 13 % (ref 11.5–15.5)
WBC: 10.1 10*3/uL (ref 4.0–10.5)
nRBC: 0 % (ref 0.0–0.2)

## 2020-08-02 LAB — BRAIN NATRIURETIC PEPTIDE: B Natriuretic Peptide: 358.9 pg/mL — ABNORMAL HIGH (ref 0.0–100.0)

## 2020-08-02 LAB — POC SARS CORONAVIRUS 2 AG -  ED: SARS Coronavirus 2 Ag: NEGATIVE

## 2020-08-02 LAB — LACTATE DEHYDROGENASE: LDH: 148 U/L (ref 98–192)

## 2020-08-02 LAB — LACTIC ACID, PLASMA
Lactic Acid, Venous: 2.8 mmol/L (ref 0.5–1.9)
Lactic Acid, Venous: 2.1 mmol/L (ref 0.5–1.9)

## 2020-08-02 LAB — C-REACTIVE PROTEIN: CRP: 27.1 mg/dL — ABNORMAL HIGH (ref <1.0)

## 2020-08-02 LAB — FIBRINOGEN: Fibrinogen: 699 mg/dL — ABNORMAL HIGH (ref 210–475)

## 2020-08-02 LAB — RESP PANEL BY RT-PCR (FLU A&B, COVID) ARPGX2
Influenza A by PCR: NEGATIVE
Influenza B by PCR: NEGATIVE
SARS Coronavirus 2 by RT PCR: NEGATIVE

## 2020-08-02 LAB — TROPONIN I (HIGH SENSITIVITY)
Troponin I (High Sensitivity): 36 ng/L — ABNORMAL HIGH (ref <18)
Troponin I (High Sensitivity): 35 ng/L — ABNORMAL HIGH (ref <18)

## 2020-08-02 LAB — PROCALCITONIN: Procalcitonin: 7.3 ng/mL

## 2020-08-02 LAB — TRIGLYCERIDES: Triglycerides: 44 mg/dL (ref <150)

## 2020-08-02 LAB — FERRITIN: Ferritin: 445 ng/mL — ABNORMAL HIGH (ref 24–336)

## 2020-08-02 LAB — D-DIMER, QUANTITATIVE: D-Dimer, Quant: 1.55 ug/mL-FEU — ABNORMAL HIGH (ref 0.00–0.50)

## 2020-08-02 MED ORDER — ACETAMINOPHEN 650 MG RE SUPP: 650.0000 mg | Freq: Four times a day (QID) | RECTAL | Status: DC | PRN

## 2020-08-02 MED ORDER — SODIUM CHLORIDE 0.9 % IV SOLN: INTRAVENOUS | Status: DC

## 2020-08-02 MED ORDER — LORAZEPAM 2 MG/ML PO CONC: 1.0000 mg | ORAL | Status: DC | PRN

## 2020-08-02 MED ORDER — MORPHINE 100MG IN NS 100ML (1MG/ML) PREMIX INFUSION
2.0000 mg/h | INTRAVENOUS | Status: DC
  Administered 2020-08-02: 2 mg/h via INTRAVENOUS
  Filled 2020-08-02: qty 100

## 2020-08-02 MED ORDER — ENOXAPARIN SODIUM 40 MG/0.4ML ~~LOC~~ SOLN: 40.0000 mg | Freq: Every day | SUBCUTANEOUS | Status: DC

## 2020-08-02 MED ORDER — SODIUM CHLORIDE 0.9 % IV SOLN: 500.0000 mg | INTRAVENOUS | Status: DC

## 2020-08-02 MED ORDER — MORPHINE BOLUS VIA INFUSION
1.0000 mg | INTRAVENOUS | Status: DC | PRN
  Filled 2020-08-02: qty 3

## 2020-08-02 MED ORDER — LORAZEPAM 2 MG/ML IJ SOLN
1.0000 mg | INTRAMUSCULAR | Status: DC | PRN
  Administered 2020-08-02 (×2): 1 mg via INTRAVENOUS
  Filled 2020-08-02 (×2): qty 1

## 2020-08-02 MED ORDER — SODIUM CHLORIDE 0.9 % IV SOLN
1.0000 g | Freq: Once | INTRAVENOUS | Status: AC
  Administered 2020-08-02: 1 g via INTRAVENOUS
  Filled 2020-08-02: qty 10

## 2020-08-02 MED ORDER — IOHEXOL 350 MG/ML SOLN
50.0000 mL | Freq: Once | INTRAVENOUS | Status: AC | PRN
  Administered 2020-08-02: 50 mL via INTRAVENOUS

## 2020-08-02 MED ORDER — GLYCOPYRROLATE 0.2 MG/ML IJ SOLN: 0.2000 mg | INTRAMUSCULAR | Status: DC | PRN

## 2020-08-02 MED ORDER — IPRATROPIUM-ALBUTEROL 0.5-2.5 (3) MG/3ML IN SOLN
RESPIRATORY_TRACT | Status: AC
  Filled 2020-08-02: qty 3

## 2020-08-02 MED ORDER — IPRATROPIUM-ALBUTEROL 0.5-2.5 (3) MG/3ML IN SOLN
3.0000 mL | Freq: Once | RESPIRATORY_TRACT | Status: AC
  Administered 2020-08-02: 3 mL via RESPIRATORY_TRACT

## 2020-08-02 MED ORDER — GLYCOPYRROLATE 1 MG PO TABS
1.0000 mg | ORAL_TABLET | ORAL | Status: DC | PRN
  Filled 2020-08-02: qty 1

## 2020-08-02 MED ORDER — CEFTRIAXONE SODIUM 2 G IJ SOLR: 2.0000 g | INTRAMUSCULAR | Status: DC

## 2020-08-02 MED ORDER — ONDANSETRON 4 MG PO TBDP: 4.0000 mg | ORAL_TABLET | Freq: Four times a day (QID) | ORAL | Status: DC | PRN

## 2020-08-02 MED ORDER — HALOPERIDOL LACTATE 2 MG/ML PO CONC
2.0000 mg | Freq: Four times a day (QID) | ORAL | Status: DC | PRN
  Filled 2020-08-02: qty 1

## 2020-08-02 MED ORDER — ACETAMINOPHEN 325 MG PO TABS: 650.0000 mg | ORAL_TABLET | Freq: Four times a day (QID) | ORAL | Status: DC | PRN

## 2020-08-02 MED ORDER — SODIUM CHLORIDE 0.9 % IV SOLN
500.0000 mg | Freq: Once | INTRAVENOUS | Status: AC
  Administered 2020-08-02: 500 mg via INTRAVENOUS
  Filled 2020-08-02: qty 500

## 2020-08-02 MED ORDER — HALOPERIDOL LACTATE 5 MG/ML IJ SOLN: 2.0000 mg | Freq: Four times a day (QID) | INTRAMUSCULAR | Status: DC | PRN

## 2020-08-02 MED ORDER — MORPHINE SULFATE (PF) 2 MG/ML IV SOLN: 2.0000 mg | INTRAVENOUS | Status: DC | PRN

## 2020-08-02 MED ORDER — ONDANSETRON HCL 4 MG/2ML IJ SOLN: 4.0000 mg | Freq: Four times a day (QID) | INTRAMUSCULAR | Status: DC | PRN

## 2020-08-02 MED ORDER — POLYVINYL ALCOHOL 1.4 % OP SOLN
1.0000 [drp] | Freq: Four times a day (QID) | OPHTHALMIC | Status: DC | PRN
  Filled 2020-08-02: qty 15

## 2020-08-02 NOTE — ED Notes (Addendum)
Attempted to give report to floor nurse, asked to call back.

## 2020-08-02 NOTE — ED Triage Notes (Signed)
BIB [facility] after staff at [facility] called to report pt having increase shortness of breath. Per EMS, pt was hypoxic on RA at 56%.PT placed on NRB. Pt had recent subdural x 2 weeks. PT has hx of late stage parkinson.   At bedside, pt sat 86% on NRB, RR 32. RT called to bedside. MD Karle Starch made aware.

## 2020-08-02 NOTE — ED Provider Notes (Signed)
12:30 PM Spoke with RN regarding this patient. Dr. Karle Starch unavailable with another patient. Request for restraint order. Pt seen by myself. He is altered and pulling at IV lines and oxygen mask. Soft restraints ordered so that he can continue to safely receive medical care.   BP (!) 131/99   Pulse 100   Temp 98.5 F (36.9 C) (Oral)   Resp (!) 38   SpO2 95%     Carlisle Cater, PA-C 07/20/2020 1521    Truddie Hidden, MD 2020/08/08 [phone]

## 2020-08-02 NOTE — ED Notes (Signed)
Pts restraints discontinued, pt calm and cooperative, resting at this time. Ccm in place, side rails up, family at bedside

## 2020-08-02 NOTE — ED Notes (Signed)
PT transported to CT>

## 2020-08-02 NOTE — Progress Notes (Addendum)
Pt arrived on unit at 1845. Richard and Delcie Roch helped move pt to bed in pt room. No family bedside.  RN bedside said plan of care is to wean pt off oxygen and increase morphine, utilize ativan, as needed to keep pt comfortable.  Confirmed plan with Stark Klein, NP. Wean off oxygen and increase morphine, utilize ativan to keep pt comfortable.  Pt responsive only to pain, moved arm towards stomach when removing EKG pads pulled on a hair. Pt on a non-rebreather at 14 L/min. Morphine drip running at 2 mL/min.

## 2020-08-02 NOTE — Telephone Encounter (Signed)
Spoke with patients wife who states  She is concerned about the patient not eating and losing weight. She states the patient is losing weight and having trouble swallowing. She states she is worried about him dying because of starvation.   Wife states she is aware of the brain bleed but she thinks some of the issues the patient is having are coming from the parkinsons.

## 2020-08-02 NOTE — Progress Notes (Signed)

## 2020-08-02 NOTE — Progress Notes (Signed)
RT called to patient room due patient having increased work of breathing and decreased sats.  Upon arrival patient was breathing in the 40s and had sats of 87% on 5L [place] with NRB.  Bipap unable to be placed on patient due to RNs having to place patient in restraints.  Placed patient on 15L [place] high flow cannula with the NRB.  Sats are now 95%.  RR is 38.  Auscultated patient and patient noted to have diminished breath sounds with faint crackles at the bases.  Awaiting COVID results.  Will continue to monitor.

## 2020-08-02 NOTE — ED Notes (Signed)
np smith at bedside

## 2020-08-02 NOTE — H&P (Signed)
History and Physical    Justin Gomez GYK:[id number] DOB: 01-28-1941 DOA: 07/21/2020  Referring MD/NP/PA: Calvert Cantor, MD PCP: Jani Gravel, MD  Patient coming from: [facility] via [facility]  Chief Complaint: Shortness of breath  I have personally briefly reviewed patient's old medical records in [place]   HPI: Justin Gomez is a 80 y.o. male with medical history significant of hypertension, CVA, Parkinson's disease, postural hypotension, diabetes mellitus type 2, hypothyroidism who presents with complaints of shortness of breath.  History is obtained from review of records and the patient's wife as providing history at this time patient had just recently been hospitalized for 1/21-1/22 after having a subdural hematoma started on Keppra due to seizure like episode noted in the ED.  Readmitted into the hospital on 1/30-2/8 due to acute metabolic encephalopathy and intracranial bleeding from recent subdural.  His mental status waxed and waned and ultimately was discontinued off of Keppra and started on Depakote 500 mg twice daily.  Patient was evaluated by speech due to difficulties with speech and started on a dysphagia type III diet with thin liquids.  Still be discussed his issues with his speech today and he was noted to have trouble breathing.    EMS found the patient to be hypoxic down to 56% on room air..  Patient was placed on nonrebreather mask.   ED Course: Upon admission into the emergency department patient was seen to be afebrile with respiration 20-38, blood pressure maintained, and O2 saturations as low as 89% with O2 saturation currently maintained on 15 L of high flow nasal cannula oxygen.  Labs significant for WBC 10.1, potassium 5.3, BUN 57, creatinine 1.38, glucose 185, albumin 2.8, total bilirubin 1.4, BNP 358.9, CRP 27.1, high-sensitivity troponin 36->35, lactic acid 2.8->2.1, COVID-19 and influenza screening were negative.  Chest x-ray was significant for multifocal  pneumonia.  Patient has been given a DuoNeb breathing treatment, Rocephin, and azithromycin.  Review of Systems  Unable to perform ROS: Medical condition    Past Medical History:  Diagnosis Date  . BPH (benign prostatic hypertrophy)   . 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 Diverticulosis   . Essential hypertension 11/29/2015  . Gastroesophageal reflux disease 04/13/2016  . Hiatal hernia   . Hyperlipidemia   . Hypothyroidism   . Lumbar herniated disc    L4  . Mild neurocognitive disorder, unclear etiology 09/24/2019  . Multiple system atrophy   . Parkinson's disease 07/31/2017  . Postural dizziness with near syncope 07/14/2017  . Postural hypotension 01/22/2017  . 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 Type 2 diabetes mellitus without complication, without long-term current use of insulin ([place]) 11/29/2015    Past Surgical History:  Procedure Laterality Date  . APPENDECTOMY  1956  . KNEE SURGERY  2009   right  . SHOULDER SURGERY  2010   left     reports that he has never smoked. He has never used smokeless tobacco. He reports current alcohol use. He reports that he does not use drugs.  Allergies  Allergen Reactions  . Benzonatate Anaphylaxis    Closed throat; difficulty swallowing    Family History  Problem Relation Age of Onset  . Cerebral aneurysm Father   . Hypertension Father   . Diabetes Mother   . Hypertension Brother   . Diabetes Sister   . Hypertension Sister   . Healthy Son   . Diverticulitis Daughter   . Colon cancer Neg Hx  Prior to Admission medications   Medication Sig Start Date End Date Taking? Authorizing Provider  atorvastatin (LIPITOR) 20 MG tablet Take 1 tablet (20 mg total) by mouth daily at 6 PM. 07/16/17   Kayleen Memos, DO  carbidopa-levodopa (SINEMET IR) 25-100 MG tablet Take 2 tablets by mouth 3 (three) times daily. 06/24/20   Tat, Eustace Quail, DO  dextromethorphan (DELSYM) 30 MG/5ML  liquid Take 2.5 mLs (15 mg total) by mouth 2 (two) times daily as needed for cough. 07/27/20   Lavina Hamman, MD  divalproex (DEPAKOTE SPRINKLE) 125 MG capsule Take 4 capsules (500 mg total) by mouth 2 (two) times daily. 07/27/20   Lavina Hamman, MD  finasteride (PROSCAR) 5 MG tablet Take 5 mg by mouth daily.    [provider]  levothyroxine (SYNTHROID, LEVOTHROID) 75 MCG tablet Take 75 mcg by mouth daily.    [provider]  losartan (COZAAR) 25 MG tablet Take 25 mg by mouth daily.    [provider]  metFORMIN (GLUCOPHAGE-XR) 750 MG 24 hr tablet Take 750 mg by mouth 2 (two) times daily.  11/03/15   [provider]  mirabegron ER (MYRBETRIQ) 50 MG TB24 tablet Take 50 mg by mouth daily.    [provider]  pantoprazole (PROTONIX) 40 MG tablet Take 40 mg by mouth daily.    [provider]    Physical Exam:  Constitutional: Ill-appearing elderly male Vitals:   08/05/2020 1250 07/23/2020 1330 08/11/2020 1400 08/13/2020 1430  BP:  (!) 129/97 (!) 116/53 97/81  Pulse:  98 97 94  Resp:  (!) 25 (!) 35 (!) 32  Temp:      TempSrc:      SpO2: 95% 100% 94% 100%   Eyes: PERRL, lids and conjunctivae normal ENMT: Mucous membranes are dry.  Posterior pharynx clear of any exudate or lesions.  Neck: normal, supple, no masses, no thyromegaly Respiratory: Tachypneic with intermittent crackles appreciated.  No significant wheezes noted.  Patient currently on breather with high flow nasal cannula oxygen in place. Cardiovascular: Regular rate and rhythm, no murmurs / rubs / gallops. No extremity edema. 2+ pedal pulses. No carotid bruits.  Abdomen: stomach with no tenderness to palpation appreciated. Musculoskeletal: no clubbing / cyanosis.  Currently in wrist restraints Skin: no rashes, lesions, ulcers. No induration Neurologic: CN 2-12 grossly intact.  Patient appears able to move all extremities Psychiatric: Lethargic    Labs on Admission: I have personally  reviewed following labs and imaging studies  CBC: Recent Labs  Lab 07/24/2020 1222  WBC 10.1  NEUTROABS 9.2*  HGB 13.1  HCT 40.2  MCV 92.0  PLT 852   Basic Metabolic Panel: Recent Labs  Lab 07/26/2020 1222  NA 139  K 5.3*  CL 100  CO2 26  GLUCOSE 185*  BUN 57*  CREATININE 1.38*  CALCIUM 8.5*   GFR: CrCl cannot be calculated (Unknown ideal weight.). Liver Function Tests: Recent Labs  Lab 07/28/2020 1222  AST 20  ALT 13  ALKPHOS 39  BILITOT 1.4*  PROT 5.9*  ALBUMIN 2.8*   No results for input(s): LIPASE, AMYLASE in the last 168 hours. No results for input(s): AMMONIA in the last 168 hours. Coagulation Profile: No results for input(s): INR, PROTIME in the last 168 hours. Cardiac Enzymes: No results for input(s): CKTOTAL, CKMB, CKMBINDEX, TROPONINI in the last 168 hours. BNP (last 3 results) No results for input(s): PROBNP in the last 8760 hours. HbA1C: No results for input(s): HGBA1C in the  last 72 hours. CBG: No results for input(s): GLUCAP in the last 168 hours. Lipid Profile: Recent Labs    07/22/2020 1222  TRIG 44   Thyroid Function Tests: No results for input(s): TSH, T4TOTAL, FREET4, T3FREE, THYROIDAB in the last 72 hours. Anemia Panel: Recent Labs    07/25/2020 1245  FERRITIN 445*   Urine analysis:    Component Value Date/Time   COLORURINE YELLOW 07/09/2020 [place] 07/09/2020 1558   LABSPEC 1.015 07/09/2020 1558   PHURINE 5.0 07/09/2020 1558   GLUCOSEU NEGATIVE 07/09/2020 1558   HGBUR NEGATIVE 07/09/2020 1558   BILIRUBINUR NEGATIVE 07/09/2020 1558   KETONESUR 5 (A) 07/09/2020 1558   PROTEINUR NEGATIVE 07/09/2020 1558   NITRITE NEGATIVE 07/09/2020 1558   LEUKOCYTESUR NEGATIVE 07/09/2020 1558   Sepsis Labs: Recent Results (from the past 240 hour(s))  SARS CORONAVIRUS 2 (TAT 6-24 HRS) Nasopharyngeal Nasopharyngeal Swab     Status: None   Collection Time: 07/26/20 11:28 AM   Specimen: Nasopharyngeal Swab  Result Value Ref  Range Status   SARS Coronavirus 2 NEGATIVE NEGATIVE Final    Comment: (NOTE) SARS-CoV-2 target nucleic acids are NOT DETECTED.  The SARS-CoV-2 RNA is generally detectable in upper and lower respiratory specimens during the acute phase of infection. Negative results do not preclude SARS-CoV-2 infection, do not rule out co-infections with other pathogens, and should not be used as the sole basis for treatment or other patient management decisions. Negative results must be combined with clinical observations, patient history, and epidemiological information. The expected result is Negative.  Fact Sheet for Patients: SugarRoll.be  Fact Sheet for Healthcare Providers: https://www.woods-mathews.com/  This test is not yet approved or cleared by the [place] FDA and  has been authorized for detection and/or diagnosis of SARS-CoV-2 by FDA under an Emergency Use Authorization (EUA). This EUA will remain  in effect (meaning this test can be used) for the duration of the COVID-19 declaration under Se ction 564(b)(1) of the Act, 21 U.S.C. section 360bbb-3(b)(1), unless the authorization is terminated or revoked sooner.  Performed at [facility], [place] [address], [place], [place] [address]   Blood Culture (routine x 2)     Status: None (Preliminary result)   Collection Time: 08/14/2020 12:22 PM   Specimen: BLOOD  Result Value Ref Range Status   Specimen Description BLOOD LEFT ANTECUBITAL  Final   Special Requests   Final    BOTTLES DRAWN AEROBIC AND ANAEROBIC Blood Culture adequate volume   Culture   Final    NO GROWTH <12 HOURS Performed at [facility], [place] [address], [place], [place] [address]    Report Status PENDING  Incomplete  Resp Panel by RT-PCR (Flu A&B, Covid) Nasopharyngeal Swab     Status: None   Collection Time: 08/15/2020 12:23 PM   Specimen: Nasopharyngeal Swab; Nasopharyngeal(NP) swabs in vial transport medium   Result Value Ref Range Status   SARS Coronavirus 2 by RT PCR NEGATIVE NEGATIVE Final    Comment: (NOTE) SARS-CoV-2 target nucleic acids are NOT DETECTED.  The SARS-CoV-2 RNA is generally detectable in upper respiratory specimens during the acute phase of infection. The lowest concentration of SARS-CoV-2 viral copies this assay can detect is 138 copies/mL. A negative result does not preclude SARS-Cov-2 infection and should not be used as the sole basis for treatment or other patient management decisions. A negative result may occur with  improper specimen collection/handling, submission of specimen other than nasopharyngeal swab, presence of viral  mutation(s) within the areas targeted by this assay, and inadequate number of viral copies(<138 copies/mL). A negative result must be combined with clinical observations, patient history, and epidemiological information. The expected result is Negative.  Fact Sheet for Patients:  EntrepreneurPulse.com.au  Fact Sheet for Healthcare Providers:  IncredibleEmployment.be  This test is no t yet approved or cleared by the [place] FDA and  has been authorized for detection and/or diagnosis of SARS-CoV-2 by FDA under an Emergency Use Authorization (EUA). This EUA will remain  in effect (meaning this test can be used) for the duration of the COVID-19 declaration under Section 564(b)(1) of the Act, 21 U.S.C.section 360bbb-3(b)(1), unless the authorization is terminated  or revoked sooner.       Influenza A by PCR NEGATIVE NEGATIVE Final   Influenza B by PCR NEGATIVE NEGATIVE Final    Comment: (NOTE) The Xpert Xpress SARS-CoV-2/FLU/RSV plus assay is intended as an aid in the diagnosis of influenza from Nasopharyngeal swab specimens and should not be used as a sole basis for treatment. Nasal washings and aspirates are unacceptable for Xpert Xpress SARS-CoV-2/FLU/RSV testing.  Fact Sheet for  Patients: EntrepreneurPulse.com.au  Fact Sheet for Healthcare Providers: IncredibleEmployment.be  This test is not yet approved or cleared by the [place] FDA and has been authorized for detection and/or diagnosis of SARS-CoV-2 by FDA under an Emergency Use Authorization (EUA). This EUA will remain in effect (meaning this test can be used) for the duration of the COVID-19 declaration under Section 564(b)(1) of the Act, 21 U.S.C. section 360bbb-3(b)(1), unless the authorization is terminated or revoked.  Performed at [facility], [place] [address], [place], [place] [address]      Radiological Exams on Admission: CT Angio Chest PE W and/or Wo Contrast  Result Date: 08/15/2020 CLINICAL DATA:  Shortness of breath question pulmonary embolism, high clinical probability EXAM: CT ANGIOGRAPHY CHEST WITH CONTRAST TECHNIQUE: Multidetector CT imaging of the chest was performed using the standard protocol during bolus administration of intravenous contrast. Multiplanar CT image reconstructions and MIPs were obtained to evaluate the vascular anatomy. CONTRAST:  40mL OMNIPAQUE IOHEXOL 350 MG/ML SOLN IV COMPARISON:  CT chest 12/03/2017 FINDINGS: Cardiovascular: Atherosclerotic calcifications aorta, coronary arteries, and proximal great vessels. Aorta normal caliber without aneurysm or dissection. Small pericardial effusion. Heart otherwise unremarkable. Pulmonary arteries adequately opacified and patent. No evidence of pulmonary embolism. Mediastinum/Nodes: Esophagus unremarkable. Base of cervical region normal appearance. Enlarged subcarinal node 14 mm short axis image 73, new. Additional mildly enlarged and subcarinal nodes and normal size RIGHT paratracheal nodes. Base of cervical region normal appearance. Lungs/Pleura: Patchy BILATERAL pulmonary infiltrates consistent with multifocal pneumonia. Extensive mucoid impaction within the RIGHT lower lobe bronchi. Mild  atelectasis RIGHT lower lobe. Small RIGHT and tiny LEFT pleural effusions. Question 9 mm nodule versus focal area of consolidation in LEFT lower lobe image 134. Additional smaller pulmonary nodules cannot be excluded in this setting. Upper Abdomen: Small hepatic cysts. Visualized upper abdomen otherwise unremarkable. Musculoskeletal: No acute osseous findings. Question partial ankylosis of thoracic spine. Review of the MIP images confirms the above findings. IMPRESSION: No evidence of pulmonary embolism. Patchy BILATERAL pulmonary infiltrates consistent with multifocal pneumonia. Extensive mucoid impaction within RIGHT lower lobe bronchi with associated small RIGHT and tiny LEFT pleural effusions. Subcarinal adenopathy, potentially reactive. Question 9 mm LEFT lower lobe nodule versus focal area of consolidation, and cannot exclude additional small pulmonary nodules in this setting; consider follow-up CT imaging in 3 months to ensure resolution and exclude underlying pulmonary  nodules and persistent adenopathy. Scattered atherosclerotic calcifications including coronary arteries. Aortic Atherosclerosis (ICD10-I70.0). Electronically Signed   By: Lavonia Dana M.D.   On: 07/30/2020 15:08   DG Chest Portable 1 View  Result Date: 08/08/2020 CLINICAL DATA:  Shortness of breath EXAM: PORTABLE CHEST 1 VIEW COMPARISON:  July 09, 2020 FINDINGS: There is airspace opacity in the left mid lung as well as in both lower lung regions. More subtle opacity noted in the right mid lung. Heart size and pulmonary vascularity are normal. No adenopathy. No bone lesions. There is aortic atherosclerosis. No bone lesions. IMPRESSION: Findings indicative of multifocal pneumonia, somewhat more on the left than on the right. Atypical organism pneumonia could present in this manner. Heart size within normal limits. No adenopathy evident. Aortic Atherosclerosis (ICD10-I70.0). Electronically Signed   By: Lowella Grip III M.D.   On:  08/01/2020 12:42    EKG: Independently reviewed.  Sinus rhythm at 98 bpm  Assessment/Plan Acute respiratory failure with hypoxia secondary to multifocal pneumonia Parkinson's disease Dysphagia Recent subdural hematoma Acute kidney injury Hyperkalemia Diabetes mellitus type 2 well-controlled Hypothyroidism DNR Comfort care measures only  Patient presents with O2 saturations noted to be as low as 52% on room air at the facility and was initially requiring high flow nasal cannula oxygen maintain O2 saturation greater than 90%.  Chest x-ray significant for multifocal pneumonia.  D-dimer was noted to be elevated 1.55, and CT angiogram of the chest was obtained and did not show any signs of a pulmonary embolus.  Suspect the patient likely to be aspirating given history of Parkinson's disease.  Labs were significant for potassium 5.3, creatinine 1.38(baseline creatinine 0.75) with BUN 57, glucose 185, albumin 2.8, and total bilirubin 1.4.  Initially patient had been started on empiric antibiotics of Rocephin and azithromycin.  Palliative care had been consulted and had further talks with family who all appear in agreement that the patient's health had been rapidly declining and that he was suffering.  They would not like any further aggressive measures and agreed to make the patient a DO NOT RESUSCITATE.  At this time they would like to keep the patient comfortable and discontinue all other -Admit to MedSurg bed -Discontinued all pending orders, studies, and home medicines -Discontinued cardiac monitoring -Okay for RN to pronounce -Appreciate palliative care consultative services -End-of-life orders placed by palliative -Morphine drip with boluses as needed -Ativan as needed for anxiety/seizure -Haldol as needed for agitation or delirium -Robinul as needed for excessive secretions -Tylenol as needed fever or mild pain  DVT prophylaxis: None Code Status: DNR Family Communication: Discussions  made with wife at bedside Disposition Plan: Hopefully transfer to [facility] once bed available Consults called: Palliative care Admission status: Inpatient   Norval Morton MD [facility]   If 7PM-7AM, please contact night-coverage   08/01/2020, 3:13 PM

## 2020-08-02 NOTE — Telephone Encounter (Signed)
I believe he is at SNF now and i'm going to have to defer to his other docs that are seeing him as Parkinsons Disease itself doesn't generally cause weight loss, especially given all that he has going on.

## 2020-08-02 NOTE — Telephone Encounter (Signed)
Dr Tat has been made aware.    Left message for patients wife to contact office.

## 2020-08-02 NOTE — ED Notes (Signed)
Attempted to give report to floor nurse, asked to call back in 65min

## 2020-08-02 NOTE — ED Provider Notes (Signed)
[facility] EMERGENCY DEPARTMENT Provider Note  CSN: [id number] Arrival date & time: 08/06/2020 1154    History Chief Complaint  Patient presents with  . Shortness of Breath    HPI  Justin Gomez is a 80 y.o. male with history of parkinson's recently admitted for SDH and discharged back to SNF. Wife was at SNF today to see patient and discuss his dysphagia with speech therapy when she noted he seemed to be having trouble breathing. He had been doing well recently, except he was apparently more agitated than usual yesterday. Wife reports SNF RN came to evaluate him and found him to be hypoxic, minimally improved with [place] and so EMS started him on NRB with improvement to 90%. He is unable to provide any additional history.    Past Medical History:  Diagnosis Date  . BPH (benign prostatic hypertrophy)   . 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 Diverticulosis   . Essential hypertension 11/29/2015  . Gastroesophageal reflux disease 04/13/2016  . Hiatal hernia   . Hyperlipidemia   . Hypothyroidism   . Lumbar herniated disc    L4  . Mild neurocognitive disorder, unclear etiology 09/24/2019  . Multiple system atrophy   . Parkinson's disease 07/31/2017  . Postural dizziness with near syncope 07/14/2017  . Postural hypotension 01/22/2017  . 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 Type 2 diabetes mellitus without complication, without long-term current use of insulin ([place]) 11/29/2015    Past Surgical History:  Procedure Laterality Date  . APPENDECTOMY  1956  . KNEE SURGERY  2009   right  . SHOULDER SURGERY  2010   left    Family History  Problem Relation Age of Onset  . Cerebral aneurysm Father   . Hypertension Father   . Diabetes Mother   . Hypertension Brother   . Diabetes Sister   . Hypertension Sister   . Healthy Son   . Diverticulitis Daughter   . Colon cancer Neg Hx     Social History   Tobacco Use  . Smoking  status: Never Smoker  . Smokeless tobacco: Never Used  Vaping Use  . Vaping Use: Never used  Substance Use Topics  . Alcohol use: Yes    Comment: occasionally  . Drug use: No     Home Medications Prior to Admission medications   Medication Sig Start Date End Date Taking? Authorizing Provider  atorvastatin (LIPITOR) 20 MG tablet Take 1 tablet (20 mg total) by mouth daily at 6 PM. 07/16/17   Kayleen Memos, DO  carbidopa-levodopa (SINEMET IR) 25-100 MG tablet Take 2 tablets by mouth 3 (three) times daily. 06/24/20   Tat, Eustace Quail, DO  dextromethorphan (DELSYM) 30 MG/5ML liquid Take 2.5 mLs (15 mg total) by mouth 2 (two) times daily as needed for cough. 07/27/20   Lavina Hamman, MD  divalproex (DEPAKOTE SPRINKLE) 125 MG capsule Take 4 capsules (500 mg total) by mouth 2 (two) times daily. 07/27/20   Lavina Hamman, MD  finasteride (PROSCAR) 5 MG tablet Take 5 mg by mouth daily.    [provider]  levothyroxine (SYNTHROID, LEVOTHROID) 75 MCG tablet Take 75 mcg by mouth daily.    [provider]  losartan (COZAAR) 25 MG tablet Take 25 mg by mouth daily.    [provider]  metFORMIN (GLUCOPHAGE-XR) 750 MG 24 hr tablet Take 750 mg by mouth 2 (two) times daily.  11/03/15   [provider]  mirabegron ER (MYRBETRIQ) 50 MG TB24 tablet Take 50 mg by mouth daily.    [provider]  pantoprazole (PROTONIX) 40 MG tablet Take 40 mg by mouth daily.    [provider]     Allergies    Benzonatate   Review of Systems   Review of Systems Unable to assess due to mental status.    Physical Exam BP (!) 121/53   Pulse 96   Temp 98.5 F (36.9 C) (Oral)   Resp (!) 26   SpO2 96%   Physical Exam Vitals and nursing note reviewed.  Constitutional:      Appearance: Normal appearance. He is well-developed.  HENT:     Head: Normocephalic and atraumatic.     Nose: Nose normal.     Mouth/Throat:     Mouth: Mucous membranes are moist.  Eyes:      Extraocular Movements: Extraocular movements intact.     Conjunctiva/sclera: Conjunctivae normal.  Cardiovascular:     Rate and Rhythm: Normal rate.  Pulmonary:     Effort: Tachypnea and respiratory distress present.     Breath sounds: Examination of the right-lower field reveals rales. Examination of the left-lower field reveals rales. Rales present.  Abdominal:     General: Abdomen is flat.     Palpations: Abdomen is soft.     Tenderness: There is no abdominal tenderness.  Musculoskeletal:        General: No swelling. Normal range of motion.     Cervical back: Neck supple.  Skin:    General: Skin is warm and dry.  Neurological:     Mental Status: He is disoriented.  Psychiatric:        Mood and Affect: Mood normal.      ED Results / Procedures / Treatments   Labs (all labs ordered are listed, but only abnormal results are displayed) Labs Reviewed  CULTURE, BLOOD (ROUTINE X 2)  CULTURE, BLOOD (ROUTINE X 2)  RESP PANEL BY RT-PCR (FLU A&B, COVID) ARPGX2  CBC WITH DIFFERENTIAL/PLATELET  LACTIC ACID, PLASMA  LACTIC ACID, PLASMA  COMPREHENSIVE METABOLIC PANEL  D-DIMER, QUANTITATIVE (NOT AT [facility])  PROCALCITONIN  LACTATE DEHYDROGENASE  FIBRINOGEN  BRAIN NATRIURETIC PEPTIDE  TRIGLYCERIDES  C-REACTIVE PROTEIN  FERRITIN  POC SARS CORONAVIRUS 2 AG -  ED  TROPONIN I (HIGH SENSITIVITY)    EKG EKG Interpretation  Date/Time:  Monday August 02 2020 12:03:51 EST Ventricular Rate:  98 PR Interval:    QRS Duration: 95 QT Interval:  329 QTC Calculation: 420 R Axis:   6 Text Interpretation: Sinus rhythm Borderline repolarization abnormality Since last tracing Rate faster Otherwise no significant change Confirmed by Calvert Cantor [phone]) on 07/30/2020 12:41:30 PM   Radiology DG Chest Portable 1 View  Result Date: 08/15/2020 CLINICAL DATA:  Shortness of breath EXAM: PORTABLE CHEST 1 VIEW COMPARISON:  July 09, 2020 FINDINGS: There is airspace opacity in the left mid  lung as well as in both lower lung regions. More subtle opacity noted in the right mid lung. Heart size and pulmonary vascularity are normal. No adenopathy. No bone lesions. There is aortic atherosclerosis. No bone lesions. IMPRESSION: Findings indicative of multifocal pneumonia, somewhat more on the left than on the right. Atypical organism pneumonia could present in this manner. Heart size within normal limits. No adenopathy evident. Aortic Atherosclerosis (ICD10-I70.0). Electronically Signed   By: Lowella Grip III M.D.   On: 07/26/2020 12:42    Procedures .Critical Care Performed by: Truddie Hidden, MD Authorized  by: Truddie Hidden, MD   Critical care provider statement:    Critical care time (minutes):  35   Critical care time was exclusive of:  Separately billable procedures and treating other patients   Critical care was necessary to treat or prevent imminent or life-threatening deterioration of the following conditions:  Sepsis and respiratory failure   Critical care was time spent personally by me on the following activities:  Discussions with consultants, evaluation of patient's response to treatment, examination of patient, ordering and performing treatments and interventions, ordering and review of laboratory studies, ordering and review of radiographic studies, pulse oximetry, re-evaluation of patient's condition, review of old charts and obtaining history from patient or surrogate   Care discussed with: admitting provider      Medications Ordered in the ED Medications  ipratropium-albuterol (DUONEB) 0.5-2.5 (3) MG/3ML nebulizer solution 3 mL (has no administration in time range)  cefTRIAXone (ROCEPHIN) 1 g in sodium chloride 0.9 % 100 mL IVPB (has no administration in time range)  azithromycin (ZITHROMAX) 500 mg in sodium chloride 0.9 % 250 mL IVPB (has no administration in time range)     MDM Rules/Calculators/A&P MDM Patient with respiratory distress and hypoxia  of unclear etiology. He could have aspiration given his history of dysphagia, not febrile in the ED. PE also possible, will need CTA when stabilized to travel. Check EKG, labs and increase O2 supplementation.  ED Course  I have reviewed the triage vital signs and the nursing notes.  Pertinent labs & imaging results that were available during my care of the patient were reviewed by me and considered in my medical decision making (see chart for details).  Clinical Course as of 07/26/2020 1516  Mon Aug 02, 2020  1245 Discussed BiPAP with RT, however patient is pulling at his mask now, he is improved to mid 90s on NRB although remains tachypneic. Will hold off for now. Wife at bedside to help make sure he keeps his mask on. She confirms he is Full Code.  [CS]  7793 Covid Ag is neg, CBC is normal, no leukocytosis. [CS]  9030 CXR concerning for multifocal pneumonia, will begin Abx.  [CS]  0923 CBC is normal.  [CS]  3007 Mildly elevated lactic acid, Dimer also mildly elevated. Plan CTA.  [CS]  6226 Covid PCR is confirmed negative. Awaiting chemistry to eval kidney function before CTA.  [CS]  3335 Creatinine mildly elevated from baseline, will send for CTA.  [CS]  81 Spoke with Dr. Tamala Julian, Hospitalist, who will evaluate the patient for admission.  [CS]    Clinical Course User Index [CS] Truddie Hidden, MD    Final Clinical Impression(s) / ED Diagnoses Final diagnoses:  Acute on chronic respiratory failure with hypoxia [facility])  Community acquired pneumonia, unspecified laterality    Rx / DC Orders ED Discharge Orders    None       Truddie Hidden, MD 08/06/2020 1517

## 2020-08-02 NOTE — Consult Note (Signed)
                                                                                 Consultation Note Date: 08/09/2020   Patient Name: Justin Gomez  DOB: 09/10/1940  MRN: [id number]  Age / Sex: 80 y.o., male  PCP: Kim, James, MD Referring Physician: , Rondell A, MD  Reason for Consultation: Establishing goals of care and "end stage Parkinson's"  HPI/Patient Profile: 80 y.o. male  with past medical history of DM, subdural hematoma, Parkinson's disease, HTN, CVA, postural hypotension presented to the ED on 08/11/2020 from [facility] with complaints of increased shortness of breath and wife's concerns about his decreased oral intake. Of note, he was recently hospitalized from 1/21-1/22 after having a subdural hematoma - he was started on Keppra due to seizure like episode noted in the ED. Readmitted into the hospital on 1/30-2/8 due to acute metabolic encephalopathy and intracranial bleeding from recent subdural hematoma - discharged to [facility]. Patient is being admitted 07/23/2020 with acute on chronic respiratory failure with hypoxia and likely aspiration pneumonia.    Clinical Assessment and Goals of Care: I have reviewed medical records including EPIC notes, labs, and imaging. Received report from primary RN - acute concern over patient's airway and acutely ill state. Patient is not able to go on BiPap due to need for restraints.  Went to visit patient at bedside - wife/Carolyn present. Patient was lying in bed - he does minimally respond to voice and firm touch, he does not open his eyes, his voice/speech is unintelligible. He is not able to participate in conversation or complex medical decision making. Signs and non-verbal gestures of pain and discomfort noted. Increased work of breathing is noted; no secretions. Patient is on 15L HFNC as well as 15L non-rebreather.   Met with wife  to discuss diagnosis, prognosis, GOC, EOL wishes, disposition, and options.  I introduced Palliative  Medicine as specialized medical care for people living with serious illness. It focuses on providing relief from the symptoms and stress of a serious illness. The goal is to improve quality of life for both the patient and the family.  We discussed a brief life review of the patient as well as functional and nutritional status. Prior to recent hospitalizations, patient lived in a private residence with his wife. She states he was able to walk, dress, and bathe himself independently until a couple of weeks ago. Carolyn explains she has seen a slow decline in his physical and mental status since his stroke and Parkinson's diagnosis two years ago; however, has noticed a drastic decline over the last several weeks. Carolyn reports the patient has been sleeping a lot, has had increased confusion, not eating or drinking well, and has lots of coughing while eating. The patient has been at [facility] since discharge after last admission on 07/27/20.  We discussed patient's current illness and what it means in the larger context of patient's on-going co-morbidities. Carolyn understands that Parkinson's disease is a progressive, non-curable disease underlying the patient's current acute medical conditions. Natural disease trajectory and expectations at EOL were discussed. We discussed the patient's risk of recurrent aspiration due to his dysphagia as   well as increased risk of rehospitalizations. I attempted to elicit values and goals of care important to the patient. The difference between aggressive medical intervention and comfort care was considered in light of the patient's goals of care. Hoyle Sauer requested to get their two children on the phone and discuss [place], but talk separately.   Called daughter/Jennifer first and reviewed information above. The difference between aggressive medical intervention and comfort care was considered in light of the patient's goals of care. Family asked about PEG tube - we discussed  that evidence has shown that PEG tubes in patients with advanced dementia do not increase survival, prevent aspiration, or improve wound healing.  They can promote isolation and use of restraints leading to increased potential for pressure ulcers. Use of PEG tubes is not medically recommended in this population; rather, careful hand feeding with aspiration precautions has been shown to provide the best quality of life. Family express understanding - they are not interested in pursing PEG tube. We talked about transition to comfort measures in house and what that would entail inclusive of medications to control pain, dyspnea, agitation, nausea, itching, and hiccups. We discussed stopping all unnecessary measures such as blood draws, needle sticks, oxygen, antibiotics, CBGs/insulin, cardiac monitoring, and frequent vital signs. Therapeutic listening provided as Anderson Malta expressed her thoughts and feelings. Anderson Malta states she no longer wishes to see the patient suffer and is agreeable with transition to full comfort care in house today. I introduced residential hospice services - they are agreeable to transfer patient to residential hospice, requesting [facility] when bed becomes available. I discussed patient may become too unstable for transfer depending on how he does weaning oxygen. Family are ok with patient remaining in house if needed for EOL care. Encouraged patient/family to consider DNR/DNI status understanding evidenced based poor outcomes in similar hospitalized patient, as the cause of arrest is likely associated with advanced chronic/terminal illness rather than an easily reversible acute cardio-pulmonary event. I explained that DNR/DNI does not change the medical plan and it only comes into effect after a person has arrested (died).  It is a protective measure to keep [place] from harming the patient in their last moments of life. Family were agreeable to DNR/DNI with understanding that the patient would  not receive CPR, defibrillation, ACLS medications, or intubation.   After call and discussion with daughter/Jennifer, called patient's son/Kevin per Carolyn's request - he was placed on speakerphone. All topics and information as described above were reviewed again in detail. Lennette Bihari also expresses his desire to keep the patient comfortable and free from suffering at this time. He is also agreeable for transition to comfort measures today as well as DNR/DNI.   Education provided on current [facility] visitation policy at EOL - family expressed understanding and appreciation.  Visit also consisted of discussions dealing with the complex and emotionally intense issues of symptom management and palliative care in the setting of serious and life-threatening illness. Palliative care team will continue to support patient, patient's family, and medical team.  Discussed with patient/family the importance of continued conversation with each other and the medical providers regarding overall plan of care and treatment options, ensuring decisions are within the context of the patient's values and GOCs.    Questions and concerns were addressed. The patient/family was encouraged to call with questions and/or concerns. PMT card was provided.    Primary Decision Maker: NEXT OF KIN wife/Carolyn Winstead    SUMMARY OF RECOMMENDATIONS   . Initiated full comfort measures -  likely days, may be hours depending on how he tolerates coming off non-rebreather . Initiated DNR/DNI - durable DNR form completed and left with RN in ED; copy will be scanned into ACP tab . Plan: wean down to 15L O2 tonight as this is the limit he can be transferred with - will reassess stability for transfer tomorrow 2/15 to [facility] . TOC notified and consult placed for: [employer] referral . Family are clear they do not wish to pursue PEG tube . Added orders for EOL symptom management and to reflect full comfort measures, as well as  discontinued orders that were not focused on comfort . Unrestricted visitation orders were placed per current [facility] EOL visitation policy  . Provide frequent assessments and administer PRN medications as clinically necessary to ensure EOL comfort . PMT will continue to follow holistically    Code Status/Advance Care Planning:  DNR   Palliative Prophylaxis:   Aspiration, Bowel Regimen, Delirium Protocol, Frequent Pain Assessment, Oral Care and Turn Reposition  Additional Recommendations (Limitations, Scope, Preferences):  Full Comfort Care  Psycho-social/Spiritual:   Desire for further Chaplaincy support:no Created space and opportunity for patient and family to express thoughts and feelings regarding patient's current medical situation.   Emotional support and therapeutic listening provided.  Prognosis:   Hours - Days / days-week depending on oxygen weaning  Discharge Planning: Hospice facility      Primary Diagnoses: Present on Admission: . Acute respiratory failure with hypoxia ([place])   I have reviewed the medical record, interviewed the patient and family, and examined the patient. The following aspects are pertinent.  Past Medical History:  Diagnosis Date  . BPH (benign prostatic hypertrophy)   . 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 Diverticulosis   . Essential hypertension 11/29/2015  . Gastroesophageal reflux disease 04/13/2016  . Hiatal hernia   . Hyperlipidemia   . Hypothyroidism   . Lumbar herniated disc    L4  . Mild neurocognitive disorder, unclear etiology 09/24/2019  . Multiple system atrophy   . Parkinson's disease 07/31/2017  . Postural dizziness with near syncope 07/14/2017  . Postural hypotension 01/22/2017  . 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 Type 2 diabetes mellitus without complication, without long-term current use of insulin ([place]) 11/29/2015   Social History    Socioeconomic History  . Marital status: Married    Spouse name: Not on file  . Number of children: 3  . Years of education: 16  . Highest education level: Bachelor's degree (e.g., BA, AB, BS)  Occupational History  . Occupation: Retired    Comment: business executive  Tobacco Use  . Smoking status: Never Smoker  . Smokeless tobacco: Never Used  Vaping Use  . Vaping Use: Never used  Substance and Sexual Activity  . Alcohol use: Yes    Comment: occasionally  . Drug use: No  . Sexual activity: Not on file  Other Topics Concern  . Not on file  Social History Narrative  . Not on file   Social Determinants of Health   Financial Resource Strain: Not on file  Food Insecurity: Not on file  Transportation Needs: Not on file  Physical Activity: Not on file  Stress: Not on file  Social Connections: Not on file   Family History  Problem Relation Age of Onset  . Cerebral aneurysm Father   . Hypertension Father   . Diabetes Mother   . Hypertension Brother   . Diabetes Sister   .  Hypertension Sister   . Healthy Son   . Diverticulitis Daughter   . Colon cancer Neg Hx    Scheduled Meds: . ipratropium-albuterol       Continuous Infusions: PRN Meds:. Medications Prior to Admission:  Prior to Admission medications   Medication Sig Start Date End Date Taking? Authorizing Provider  atorvastatin (LIPITOR) 20 MG tablet Take 1 tablet (20 mg total) by mouth daily at 6 PM. 07/16/17   Hall, Carole N, DO  carbidopa-levodopa (SINEMET IR) 25-100 MG tablet Take 2 tablets by mouth 3 (three) times daily. 06/24/20   Tat, Rebecca S, DO  dextromethorphan (DELSYM) 30 MG/5ML liquid Take 2.5 mLs (15 mg total) by mouth 2 (two) times daily as needed for cough. 07/27/20   Patel, Pranav M, MD  divalproex (DEPAKOTE SPRINKLE) 125 MG capsule Take 4 capsules (500 mg total) by mouth 2 (two) times daily. 07/27/20   Patel, Pranav M, MD  finasteride (PROSCAR) 5 MG tablet Take 5 mg by mouth daily.    [provider]  levothyroxine (SYNTHROID, LEVOTHROID) 75 MCG tablet Take 75 mcg by mouth daily.    [provider]  losartan (COZAAR) 25 MG tablet Take 25 mg by mouth daily.    [provider]  metFORMIN (GLUCOPHAGE-XR) 750 MG 24 hr tablet Take 750 mg by mouth 2 (two) times daily.  11/03/15   [provider]  mirabegron ER (MYRBETRIQ) 50 MG TB24 tablet Take 50 mg by mouth daily.    [provider]  pantoprazole (PROTONIX) 40 MG tablet Take 40 mg by mouth daily.    [provider]   Allergies  Allergen Reactions  . Benzonatate Anaphylaxis    Closed throat; difficulty swallowing   Review of Systems  Unable to perform ROS: Acuity of condition    Physical Exam Vitals and nursing note reviewed.  Constitutional:      General: He is not in acute distress. Pulmonary:     Effort: Accessory muscle usage present. No respiratory distress.  Skin:    General: Skin is warm and dry.  Neurological:     Mental Status: He is lethargic.     Motor: Weakness present.  Psychiatric:        Speech: He is noncommunicative.     Vital Signs: BP (!) 125/53   Pulse 89   Temp 98.5 F (36.9 C) (Oral)   Resp (!) 33   SpO2 91%          SpO2: SpO2: 91 % O2 Device:SpO2: 91 % O2 Flow Rate: .O2 Flow Rate (L/min): 15 L/min  IO: Intake/output summary:   Intake/Output Summary (Last 24 hours) at 08/10/2020 1526 Last data filed at 08/11/2020 1508 Gross per 24 hour  Intake 250 ml  Output -  Net 250 ml    LBM:   Baseline Weight:   Most recent weight:       Palliative Assessment/Data: PPS 10%     Time In: 1530 Time Out: 1715 Time Total: 105 minutes  Greater than 50%  of this time was spent counseling and coordinating care related to the above assessment and plan.  Signed by:  M , NP   Please contact Palliative Medicine Team phone at [phone] for questions and concerns.  For individual provider: See Amion             

## 2020-08-02 NOTE — ED Notes (Signed)
Dinner Tray Ordered @ [phone].

## 2020-08-02 NOTE — Telephone Encounter (Signed)
Spoke with Dr Tat who suggests the patients wife speak with the provider at [facility] and have them order an eval for speech and swallowing. The patient has not been seen recently so Dr Tat can not order any therapy or evaluations with out her evaluating the patient. Patient has missed his last few appointments and needs a current appointment to be evaluated before she can place orders.    Contacted the patients wife to give her Dr Doristine Devoid recommendations and she states the patient is being transported to the hospital because his oxygen level and blood pressure is low. Advised her to contact the office at a later time to get Dr Doristine Devoid recommendations. She voiced understanding.

## 2020-08-03 DIAGNOSIS — J9601 Acute respiratory failure with hypoxia: Secondary | ICD-10-CM | POA: Diagnosis not present

## 2020-08-03 DIAGNOSIS — E875 Hyperkalemia: Secondary | ICD-10-CM | POA: Diagnosis not present

## 2020-08-03 DIAGNOSIS — N179 Acute kidney failure, unspecified: Secondary | ICD-10-CM | POA: Diagnosis not present

## 2020-08-03 DIAGNOSIS — R1310 Dysphagia, unspecified: Secondary | ICD-10-CM | POA: Diagnosis not present

## 2020-08-07 LAB — CULTURE, BLOOD (ROUTINE X 2)
Culture: NO GROWTH
Culture: NO GROWTH
Special Requests: ADEQUATE

## 2020-08-16 ENCOUNTER — Ambulatory Visit: Payer: HMO | Admitting: Neurology

## 2020-08-16 ENCOUNTER — Encounter: Payer: Self-pay | Admitting: Physical Therapy

## 2020-08-16 ENCOUNTER — Ambulatory Visit: Payer: Self-pay

## 2020-08-16 NOTE — Therapy (Signed)
[facility] [address] [place], [place], [address] Phone: [phone]   Fax:  [phone]  Patient Details  Name: Justin Gomez MRN: [id number] Date of Birth: [birth year] Referring Provider:  No ref. provider found  Encounter Date: 08/16/2020  PHYSICAL THERAPY DISCHARGE SUMMARY  Visits from Start of Care: 1 (eval only)  Current functional level related to goals / functional outcomes: Per eval    Remaining deficits: NA   Education / Equipment: NA  Plan:                                                    Patient goals were not met. Patient is being discharged due to a change in medical status.  ?????Pt did not return to PT due to several ED admissions and subsequent medical issues.  Pt is deceased, 08-09-20.         Hassie Mandt W. 08/16/2020, 8:24 AM Frazier Butt., PT  [facility] [address] [place], [place], [address] Phone: [phone]   Fax:  [phone]

## 2020-08-17 NOTE — Death Summary Note (Addendum)
Death Summary  Justin Gomez SHF:[id number] DOB: [birth year] DOA: 2020/08/25  PCP: Jani Gravel, MD  Admit date: 08/25/2020 Date of Death: 08-26-2020 Time of Death: 7:18 am Notification: Jani Gravel, MD notified of death of 2020/08/26   History of present illness:  Justin Gomez is a 80 y.o. male with a history of hypertension, CVA, Parkinson's disease, postural hypotension, diabetes mellitus type 2, hypothyroidism, and recent subdural hematoma. Justin Gomez presented with complaint of shortness of breath. Justin Gomez did not improve after being placed on high flow nasal cannula oxygen with nonrebreather mask.  Imaging studies revealed multifocal pneumonia without signs of pulmonary embolus.  Patient suspected likely to be aspirating due to worsening dysphagia related with progression of Parkinson's disease after recent subdural hematoma.  Patient had initially been placed on empiric antibiotics of Rocephin and azithromycin.  Palliative care has been consulted and after further talks with the family they had agreed that patient's health have progressively decline and he seemed to be suffering.  They agreed to change CODE STATUS to DO NOT RESUSCITATE.  Ultimately family wanted the patient just to be made comfortable at this time and let nature take its course.  Palliative care placed end-of-life orders.  Patient was accepted to a palliative bed with plans to transfer to [facility] once bed available.  He had been started on a morphine drip and overnight had developed increased respiratory distress and pain for which morphine drip was increased to 3 mg/h.  Time of death pronounced by two RNs this morning at 7:18 AM.  Final Diagnoses:  1.  Acute respiratory failure with hypoxia secondary to multifocal pneumonia 2.  Dysphagia 3.  Parkinson's disease 4.  Acute kidney injury  5.  Hyperkalemia    The results of significant diagnostics from this hospitalization (including imaging,  microbiology, ancillary and laboratory) are listed below for reference.    Significant Diagnostic Studies: CT HEAD WO CONTRAST  Result Date: 07/21/2020 CLINICAL DATA:  Acute neuro deficit. Right facial droop. History of subdural hematoma. EXAM: CT HEAD WITHOUT CONTRAST TECHNIQUE: Contiguous axial images were obtained from the base of the skull through the vertex without intravenous contrast. COMPARISON:  07/18/2020 FINDINGS: Brain: Small persistent but fading left-sided subdural hematoma with maximum diameter of 5 mm. This previously 7.5 mm. Stable subacute subdural fluid more anteriorly in the left frontal area. Stable subdural blood or layering subarachnoid blood along the left tentorium. No progressive findings. No findings for new/acute hemorrhage when compared to the prior study. The ventricles are in the midline without mass effect or shift. Stable age related cerebral atrophy, ventriculomegaly and periventricular white matter disease. Vascular: Stable vascular calcifications. No aneurysm or hyperdense vessels. Skull: No skull fracture or bone lesions. Sinuses/Orbits: The paranasal sinuses and mastoid air cells are clear. The globes are intact. Other: No scalp lesions or hematoma. IMPRESSION: 1. Slight interval decrease in size of the left-sided subdural hematoma. 2. Stable subacute subdural fluid more anteriorly in the left frontal area. 3. Stable subdural blood or layering subarachnoid blood along the left tentorium. 4. Stable age related cerebral atrophy, ventriculomegaly and periventricular white matter disease. Electronically Signed   By: Marijo Sanes M.D.   On: 07/21/2020 13:47   CT HEAD WO CONTRAST  Result Date: 07/18/2020 CLINICAL DATA:  Headache. Intracranial hemorrhage suspected. Fall last week, with slurred speech and difficulty speaking since fall. EXAM: CT HEAD WITHOUT CONTRAST TECHNIQUE: Contiguous axial images were obtained from the base of the skull through the vertex without  intravenous contrast. COMPARISON:  Head CT dated 07/10/2020 FINDINGS: Brain: Ventricles are stable in size and configuration. Mild chronic small vessel ischemic changes noted within the bilateral periventricular white matter regions. Slight decrease in size of the subdural hemorrhage overlying the LEFT frontoparietal lobe compared to head CT of 07/10/2020, and also decreased in density per expected aging (now subacute in appearance). There is now a thin amount of acute-appearing subdural hemorrhage overlying the LEFT tentorium, likely extension from the aforementioned hemorrhage overlying the LEFT frontoparietal lobe. No evidence of additional acute intracranial hemorrhage. No midline shift or herniation. Vascular: Chronic calcified atherosclerotic changes of the large vessels at the skull base. Focal heavy atherosclerotic calcification of the LEFT upper vertebral artery, versus stent. No unexpected hyperdense vessel. Skull: Normal. Negative for fracture or focal lesion. Sinuses/Orbits: No acute finding. Other: Fairly large amount of cerumen within the external auditory canal regions bilaterally. IMPRESSION: 1. Slight decrease in size of the subdural hemorrhage overlying the LEFT frontoparietal lobe compared to head CT of 07/10/2020, and also decreased in density per expected aging (now subacute in appearance). Mild mass effect with sulcal effacement in the LEFT frontoparietal lobe, but no midline shift or herniation. 2. There is now a thin amount of acute-appearing subdural hemorrhage seen overlying the LEFT tentorium, likely interval extension from the aforementioned hemorrhage overlying the LEFT frontoparietal lobe. 3. No evidence of additional acute intracranial hemorrhage. 4. Mild chronic small vessel ischemic changes within the white matter. Electronically Signed   By: Franki Cabot M.D.   On: 07/18/2020 12:01   CT HEAD WO CONTRAST  Result Date: 07/10/2020 CLINICAL DATA:  Follow-up subdural hemorrhage  EXAM: CT HEAD WITHOUT CONTRAST TECHNIQUE: Contiguous axial images were obtained from the base of the skull through the vertex without intravenous contrast. COMPARISON:  Yesterday FINDINGS: Brain: High-density subdural hematoma along the left cerebral convexity measuring up to 8 mm in thickness. Mild cortical mass effect. No interval growth. No new site of hemorrhage. No visible infarct, hydrocephalus, or mass. Vascular: No hyperdense vessel or unexpected calcification. Skull: Normal. Negative for fracture or focal lesion. Sinuses/Orbits: No acute finding. IMPRESSION: No progression of the left-sided subdural hematoma. Mass effect remains mild. Electronically Signed   By: Monte Fantasia M.D.   On: 07/10/2020 06:39   CT HEAD WO CONTRAST  Result Date: 07/09/2020 CLINICAL DATA:  Slurred speech EXAM: CT HEAD WITHOUT CONTRAST CT CERVICAL SPINE WITHOUT CONTRAST TECHNIQUE: Multidetector CT imaging of the head and cervical spine was performed following the standard protocol without intravenous contrast. Multiplanar CT image reconstructions of the cervical spine were also generated. COMPARISON:  CT brain and cervical spine 07/14/2017, MRI 07/15/2017 FINDINGS: CT HEAD FINDINGS Brain: No acute territorial infarction or intracranial mass is visualized. Acute left convexity subdural hematoma measuring up to 10 mm maximum thickness. No significant midline shift. Mild atrophy. Stable ventricle size. Vascular: No hyperdense vessels. Vertebral and carotid vascular calcification Skull: Normal. Negative for fracture or focal lesion. Sinuses/Orbits: No acute finding. Other: None CT CERVICAL SPINE FINDINGS Alignment: Straightening of the cervical spine.  No subluxation. Skull base and vertebrae: No acute fracture. No primary bone lesion or focal pathologic process. Soft tissues and spinal canal: No prevertebral fluid or swelling. No visible canal hematoma. Disc levels: Bulky flowing anterior osteophytes C3 through T1 with ankylosis,  consistent with dish. Mild disc space narrowing at C2-C3, C4-C5, C5-C6 and C6-C7. Facet degenerative changes at multiple levels. Upper chest: Minimal apical scarring. Other: None IMPRESSION: 1. Acute left convexity subdural hematoma measuring up to  10 mm maximum thickness. No significant midline shift. 2. 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 No acute osseous abnormality. Critical Value/emergent results were called by telephone at the time of interpretation on 07/09/2020 at 5:05 pm to provider DAVID YAO , who verbally acknowledged these results. Electronically Signed   By: Donavan Foil M.D.   On: 07/09/2020 17:06   CT Angio Chest PE W and/or Wo Contrast  Result Date: 08/11/2020 CLINICAL DATA:  Shortness of breath question pulmonary embolism, high clinical probability EXAM: CT ANGIOGRAPHY CHEST WITH CONTRAST TECHNIQUE: Multidetector CT imaging of the chest was performed using the standard protocol during bolus administration of intravenous contrast. Multiplanar CT image reconstructions and MIPs were obtained to evaluate the vascular anatomy. CONTRAST:  54mL OMNIPAQUE IOHEXOL 350 MG/ML SOLN IV COMPARISON:  CT chest 12/03/2017 FINDINGS: Cardiovascular: Atherosclerotic calcifications aorta, coronary arteries, and proximal great vessels. Aorta normal caliber without aneurysm or dissection. Small pericardial effusion. Heart otherwise unremarkable. Pulmonary arteries adequately opacified and patent. No evidence of pulmonary embolism. Mediastinum/Nodes: Esophagus unremarkable. Base of cervical region normal appearance. Enlarged subcarinal node 14 mm short axis image 73, new. Additional mildly enlarged and subcarinal nodes and normal size RIGHT paratracheal nodes. Base of cervical region normal appearance. Lungs/Pleura: Patchy BILATERAL pulmonary infiltrates consistent with multifocal pneumonia. Extensive mucoid impaction within the RIGHT lower lobe  bronchi. Mild atelectasis RIGHT lower lobe. Small RIGHT and tiny LEFT pleural effusions. Question 9 mm nodule versus focal area of consolidation in LEFT lower lobe image 134. Additional smaller pulmonary nodules cannot be excluded in this setting. Upper Abdomen: Small hepatic cysts. Visualized upper abdomen otherwise unremarkable. Musculoskeletal: No acute osseous findings. Question partial ankylosis of thoracic spine. Review of the MIP images confirms the above findings. IMPRESSION: No evidence of pulmonary embolism. Patchy BILATERAL pulmonary infiltrates consistent with multifocal pneumonia. Extensive mucoid impaction within RIGHT lower lobe bronchi with associated small RIGHT and tiny LEFT pleural effusions. Subcarinal adenopathy, potentially reactive. Question 9 mm LEFT lower lobe nodule versus focal area of consolidation, and cannot exclude additional small pulmonary nodules in this setting; consider follow-up CT imaging in 3 months to ensure resolution and exclude underlying pulmonary nodules and persistent adenopathy. Scattered atherosclerotic calcifications including coronary arteries. Aortic Atherosclerosis (ICD10-I70.0). Electronically Signed   By: Lavonia Dana M.D.   On: 08/16/2020 15:08   CT Cervical Spine Wo Contrast  Result Date: 07/09/2020 CLINICAL DATA:  Slurred speech EXAM: CT HEAD WITHOUT CONTRAST CT CERVICAL SPINE WITHOUT CONTRAST TECHNIQUE: Multidetector CT imaging of the head and cervical spine was performed following the standard protocol without intravenous contrast. Multiplanar CT image reconstructions of the cervical spine were also generated. COMPARISON:  CT brain and cervical spine 07/14/2017, MRI 07/15/2017 FINDINGS: CT HEAD FINDINGS Brain: No acute territorial infarction or intracranial mass is visualized. Acute left convexity subdural hematoma measuring up to 10 mm maximum thickness. No significant midline shift. Mild atrophy. Stable ventricle size. Vascular: No hyperdense vessels.  Vertebral and carotid vascular calcification Skull: Normal. Negative for fracture or focal lesion. Sinuses/Orbits: No acute finding. Other: None CT CERVICAL SPINE FINDINGS Alignment: Straightening of the cervical spine.  No subluxation. Skull base and vertebrae: No acute fracture. No primary bone lesion or focal pathologic process. Soft tissues and spinal canal: No prevertebral fluid or swelling. No visible canal hematoma. Disc levels: Bulky flowing anterior osteophytes C3 through T1 with ankylosis, consistent with dish. Mild disc space narrowing at C2-C3, C4-C5, C5-C6 and C6-C7. Facet degenerative changes at multiple levels.  Upper chest: Minimal apical scarring. Other: None IMPRESSION: 1. Acute left convexity subdural hematoma measuring up to 10 mm maximum thickness. No significant midline shift. 2. 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 No acute osseous abnormality. Critical Value/emergent results were called by telephone at the time of interpretation on 07/09/2020 at 5:05 pm to provider DAVID YAO , who verbally acknowledged these results. Electronically Signed   By: Donavan Foil M.D.   On: 07/09/2020 17:06   MR BRAIN WO CONTRAST  Result Date: 07/09/2020 CLINICAL DATA:  Slurred speech, prior fall. EXAM: MRI HEAD WITHOUT CONTRAST TECHNIQUE: Multiplanar, multiecho pulse sequences of the brain and surrounding structures were obtained without intravenous contrast. COMPARISON:  07/09/2020 and prior. FINDINGS: Brain: No acute infarct. Acute left cerebral convexity subdural hematoma measuring up to 1.0 cm is unchanged. No significant mass effect or parenchymal edema. No midline shift or ventriculomegaly. Mild cerebral atrophy with ex vacuo dilatation. Tiny remote right cerebellar lacunar insult. Vascular: Normal flow voids. Skull and upper cervical spine: Normal marrow signal. Sinuses/Orbits: No acute orbital finding. Pneumatized paranasal  sinuses. Trace left mastoid free fluid. Other: None. IMPRESSION: Unchanged acute left cerebral convexity subdural hematoma measuring 1.0 cm. Mild cerebral atrophy.  Tiny remote right cerebellar lacunar insult. Electronically Signed   By: Primitivo Gauze M.D.   On: 07/09/2020 20:11   DG Chest Portable 1 View  Result Date: 07/22/2020 CLINICAL DATA:  Shortness of breath EXAM: PORTABLE CHEST 1 VIEW COMPARISON:  July 09, 2020 FINDINGS: There is airspace opacity in the left mid lung as well as in both lower lung regions. More subtle opacity noted in the right mid lung. Heart size and pulmonary vascularity are normal. No adenopathy. No bone lesions. There is aortic atherosclerosis. No bone lesions. IMPRESSION: Findings indicative of multifocal pneumonia, somewhat more on the left than on the right. Atypical organism pneumonia could present in this manner. Heart size within normal limits. No adenopathy evident. Aortic Atherosclerosis (ICD10-I70.0). Electronically Signed   By: Lowella Grip III M.D.   On: 07/30/2020 12:42   DG Chest Port 1 View  Result Date: 07/09/2020 CLINICAL DATA:  Slurred speech EXAM: PORTABLE CHEST 1 VIEW COMPARISON:  10/17/2009 FINDINGS: The heart size and mediastinal contours are within normal limits. Both lungs are clear. The visualized skeletal structures are unremarkable. IMPRESSION: No active disease. 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  Diverticulosis   Essential hypertension 11/29/2015  Gastroesophageal reflux disease 04/13/2016  Hiatal hernia   Hyperlipidemia   Hypothyroidism   Lumbar herniated disc   L4  Mild neurocognitive disorder, unclear etiology 09/24/2019  Multiple system atrophy   Parkinson's disease 07/31/2017  Postural dizziness with near syncope 07/14/2017  Postural hypotension 01/22/2017  Subdural hematoma 07/15/2017  Type 2 diabetes mellitus without complication, without long-term current use of insulin ([place]) 11/29/2015 Past Surgical History: Past Surgical History: Procedure Laterality Date  APPENDECTOMY  1956  KNEE SURGERY  2009  right  SHOULDER  SURGERY  2010  left HPI: 80 y.o. male with medical history significant of CVA, hypertension, GERD, Parkinson's disease, postural hypotension,'s diabetes, hypothyroidism, recent admission for subdural hematoma who presents with some intermittent confusion and difficulty speaking for the past 2 days. Patient was recently admitted from 1/21-1/22 after he had a fall and hit his head and was noted to have a subdural hematoma and associated slurred speech.  A repeat CT the following day was stable and there was no indication for surgical intervention and his slurred speech was improving so he was discharged home.  He was discharged on Keppra due to a 15-second seizure-like episode in the ED during that admission.  He has been taking all prescribed medications including Keppra at home. Repeat CT of head this admission showed There is now a thin amount of acute-appearing subdural hemorrhage seen overlying the LEFT tentorium, likely interval extension from the aforementioned hemorrhage overlying the LEFT frontoparietal lobe.  Subjective: alert, upright in chair for procedure Assessment / Plan / Recommendation CHL IP CLINICAL IMPRESSIONS 07/22/2020 Clinical Impression Pt presents with an acute on chronic dysphagia, suspected to be exacerbated by  recent hospital stays and in setting of PD. Mild oral dysphagia was exhbited and moderate to severe pharyngeal dysphagia was noted. Oral deficits included delayed AP transit, mild oral residuals, and reduced bolus cohesion with mechanical soft textures. Global pharyngeal deficits included reduced base of tongue retraction, poor epiglottic deflection (likely impacted by protrusion of posterior pharyngeal wall from cervical osteophyte), reduced laryngeal elevation, poor UES opening, reduced laryngeal vestibule and glottic closure, and diminished sensation. This allowed for frequent pre and during the swallow penetration of ALL consistencies tested with episodic aspiration. Gross silent aspiration noted with thins via straw use. Smaller amounts of aspiration noted with nectar and honey thick liquids from post swallow residuals. Residual amounts noted with all PO, moderate in the valleculae and pyriform sinuses. Pt dysphagia has progressed since last objective swallow study in 06/2019. SLP discussed with pt and spouse, notified MD. Aspiration risk cannot be eliminated with PO diet. Pt and spouse wish to continue PO diet with known risk and use of safe swallowing strategies, oropharyngeal rehabilitation with SLP, and diligent oral care. SLP to closely monitor. Recommend dysphagia 3 (mechanical soft) and thin liquids with no straws and medicines crushed in puree.   SLP Visit Diagnosis Dysphagia, oropharyngeal phase (R13.12) Attention and concentration deficit following -- Frontal lobe and executive function deficit following -- Impact on safety and function Moderate aspiration risk;Severe aspiration risk   CHL IP TREATMENT RECOMMENDATION 07/22/2020 Treatment Recommendations Therapy as outlined in treatment plan below   Prognosis 07/22/2020 Prognosis for Safe Diet Advancement Fair Barriers to Reach Goals Severity of deficits Barriers/Prognosis Comment -- [facility] IP DIET RECOMMENDATION 07/22/2020 SLP Diet Recommendations Thin  liquid;Dysphagia 3 (Mech soft) solids Liquid Administration via Cup;No straw Medication Administration -- Compensations Small sips/bites;Slow rate;Minimize environmental distractions;Clear throat after each swallow;Multiple dry swallows after each bite/sip;Effortful swallow Postural Changes Remain semi-upright after after feeds/meals (Comment);Seated upright at 90 degrees   CHL IP OTHER RECOMMENDATIONS 07/22/2020 Recommended Consults -- Oral Care Recommendations Oral care BID;Oral care before and after PO Other Recommendations --   [facility] IP FOLLOW UP RECOMMENDATIONS 07/22/2020 Follow up Recommendations Skilled Nursing facility   [facility] IP FREQUENCY AND DURATION 07/22/2020 Speech Therapy Frequency (ACUTE ONLY) min 2x/week Treatment Duration 2 weeks      CHL IP ORAL PHASE 07/22/2020 Oral Phase Impaired Oral - Pudding Teaspoon -- Oral - Pudding Cup -- Oral - Honey  Teaspoon -- Oral - Honey Cup Weak lingual manipulation;Lingual/palatal residue;Reduced posterior propulsion;Delayed oral transit Oral - Nectar Teaspoon -- Oral - Nectar Cup Weak lingual manipulation;Reduced posterior propulsion;Delayed oral transit;Lingual/palatal residue Oral - Nectar Straw -- Oral - Thin Teaspoon -- Oral - Thin Cup Delayed oral transit;Reduced posterior propulsion;Weak lingual manipulation;Lingual/palatal residue Oral - Thin Straw Delayed oral transit;Incomplete tongue to palate contact Oral - Puree Lingual/palatal residue;Delayed oral transit;Weak lingual manipulation Oral - Mech Soft Impaired mastication;Weak lingual manipulation;Reduced posterior propulsion;Delayed oral transit;Lingual/palatal residue Oral - Regular -- Oral - Multi-Consistency -- Oral - Pill Reduced posterior propulsion;Holding of bolus;Other (Comment) Oral Phase - Comment --  CH[facility] IP PHARYNGEAL PHASE 07/22/2020 Pharyngeal Phase Impaired Pharyngeal- Pudding Teaspoon -- Pharyngeal -- Pharyngeal- Pudding Cup -- Pharyngeal -- Pharyngeal- Honey Teaspoon -- Pharyngeal -- Pharyngeal- Honey  Cup Reduced epiglottic inversion;Reduced anterior laryngeal mobility;Reduced laryngeal elevation;Reduced airway/laryngeal closure;Reduced pharyngeal peristalsis;Penetration/Aspiration before swallow;Penetration/Aspiration during swallow;Trace aspiration;Pharyngeal residue - valleculae;Pharyngeal residue - pyriform;Pharyngeal residue - posterior pharnyx Pharyngeal Material enters airway, remains ABOVE vocal cords and not ejected out;Material enters airway, passes BELOW cords without attempt by patient to eject out (silent aspiration) Pharyngeal- Nectar Teaspoon -- Pharyngeal -- Pharyngeal- Nectar Cup Delayed swallow initiation-pyriform sinuses;Reduced pharyngeal peristalsis;Reduced epiglottic inversion;Reduced anterior laryngeal mobility;Reduced laryngeal elevation;Reduced airway/laryngeal closure;Reduced tongue base retraction;Penetration/Aspiration before swallow;Penetration/Aspiration during swallow;Pharyngeal residue - valleculae;Pharyngeal residue - pyriform Pharyngeal Material enters airway, remains ABOVE vocal cords and not ejected out;Material enters airway, passes BELOW cords without attempt by patient to eject out (silent aspiration) Pharyngeal- Nectar Straw -- Pharyngeal -- Pharyngeal- Thin Teaspoon -- Pharyngeal -- Pharyngeal- Thin Cup Delayed swallow initiation-pyriform sinuses;Reduced pharyngeal peristalsis;Reduced epiglottic inversion;Reduced anterior laryngeal mobility;Reduced laryngeal elevation;Reduced airway/laryngeal closure;Reduced tongue base retraction;Penetration/Aspiration before swallow;Penetration/Aspiration during swallow;Pharyngeal residue - valleculae;Pharyngeal residue - pyriform Pharyngeal Material enters airway, passes BELOW cords without attempt by patient to eject out (silent aspiration);Material enters airway, remains ABOVE vocal cords and not ejected out Pharyngeal- Thin Straw Delayed swallow initiation-pyriform sinuses;Reduced pharyngeal peristalsis;Reduced epiglottic  inversion;Reduced anterior laryngeal mobility;Reduced laryngeal elevation;Reduced airway/laryngeal closure;Reduced tongue base retraction;Penetration/Aspiration before swallow;Penetration/Aspiration during swallow;Significant aspiration (Amount);Pharyngeal residue - pyriform;Pharyngeal residue - valleculae Pharyngeal Material enters airway, passes BELOW cords without attempt by patient to eject out (silent aspiration) Pharyngeal- Puree Delayed swallow initiation-vallecula;Reduced pharyngeal peristalsis;Reduced epiglottic inversion;Reduced anterior laryngeal mobility;Reduced laryngeal elevation;Reduced airway/laryngeal closure;Reduced tongue base retraction;Penetration/Aspiration before swallow;Penetration/Aspiration during swallow;Pharyngeal residue - valleculae;Pharyngeal residue - pyriform Pharyngeal Material enters airway, remains ABOVE vocal cords and not ejected out Pharyngeal- Mechanical Soft Delayed swallow initiation-vallecula;Reduced pharyngeal peristalsis;Reduced epiglottic inversion;Reduced anterior laryngeal mobility;Reduced airway/laryngeal closure;Reduced laryngeal elevation;Reduced tongue base retraction;Penetration/Aspiration before swallow;Penetration/Aspiration during swallow;Pharyngeal residue - valleculae;Pharyngeal residue - pyriform Pharyngeal -- Pharyngeal- Regular -- Pharyngeal -- Pharyngeal- Multi-consistency -- Pharyngeal -- Pharyngeal- Pill Other (Comment) Pharyngeal -- Pharyngeal Comment --  CHL IP CERVICAL ESOPHAGEAL PHASE 07/22/2020 Cervical Esophageal Phase Impaired Pudding Teaspoon -- Pudding Cup -- Honey Teaspoon -- Honey Cup Reduced cricopharyngeal relaxation;Prominent cricopharyngeal segment Nectar Teaspoon -- Nectar Cup Reduced cricopharyngeal relaxation;Prominent cricopharyngeal segment Nectar Straw -- Thin Teaspoon -- Thin Cup Reduced cricopharyngeal relaxation;Prominent cricopharyngeal segment Thin Straw Reduced cricopharyngeal relaxation;Prominent cricopharyngeal segment Puree  Reduced cricopharyngeal relaxation;Prominent cricopharyngeal segment Mechanical Soft Reduced cricopharyngeal relaxation;Prominent cricopharyngeal segment Regular -- Multi-consistency -- Pill -- Cervical Esophageal Comment -- Hayden Rasmussen MA, CCC-SLP [facility] 07/22/2020, 12:03 PM              EEG adult  Result Date: 07/19/2020 Lora Havens, MD     07/19/2020  2:22 PM Patient Name: Justin Gomez MRN: [id number] Epilepsy Attending: Lora Havens  Referring Physician/Provider: Date: 07/19/2020 Duration: 23.45 mins Patient history: 80 year old male with enlarging subdural hematoma and worsened confusion. EEG to evaluate for seizure Level of alertness: Awake AEDs during EEG study: LEV Technical aspects: This EEG study was done with scalp electrodes positioned according to the 10-20 International system of electrode placement. Electrical activity was acquired at a sampling rate of 500Hz  and reviewed with a high frequency filter of 70Hz  and a low frequency filter of 1Hz . EEG data were recorded continuously and digitally stored. Description: No posterior dominant rhythm was seen. EEG showed continuous generalized and maximal left tempora region5-7hz  theta as well as 2-3 hz delta slowing.  Hyperventilation and photic stimulation were not performed.   ABNORMALITY -Continuous slow, generalized IMPRESSION: This study is suggestive of cortical dysfunction in left temporal region likely secondary to underlying bleed. There is also moderate diffuse encephalopathy, nonspecific etiology. No seizures or epileptiform discharges were seen throughout the recording. Lora Havens    Microbiology: Recent Results (from the past 240 hour(s))  SARS CORONAVIRUS 2 (TAT 6-24 HRS) Nasopharyngeal Nasopharyngeal Swab     Status: None   Collection Time: 07/26/20 11:28 AM   Specimen: Nasopharyngeal Swab  Result Value Ref Range Status   SARS Coronavirus 2 NEGATIVE NEGATIVE Final    Comment: (NOTE) SARS-CoV-2  target nucleic acids are NOT DETECTED.  The SARS-CoV-2 RNA is generally detectable in upper and lower respiratory specimens during the acute phase of infection. Negative results do not preclude SARS-CoV-2 infection, do not rule out co-infections with other pathogens, and should not be used as the sole basis for treatment or other patient management decisions. Negative results must be combined with clinical observations, patient history, and epidemiological information. The expected result is Negative.  Fact Sheet for Patients: SugarRoll.be  Fact Sheet for Healthcare Providers: https://www.woods-mathews.com/  This test is not yet approved or cleared by the [place] FDA and  has been authorized for detection and/or diagnosis of SARS-CoV-2 by FDA under an Emergency Use Authorization (EUA). This EUA will remain  in effect (meaning this test can be used) for the duration of the COVID-19 declaration under Se ction 564(b)(1) of the Act, 21 U.S.C. section 360bbb-3(b)(1), unless the authorization is terminated or revoked sooner.  Performed at [facility], [place] [address] [place], [place] [address]   Blood Culture (routine x 2)     Status: None (Preliminary result)   Collection Time: 08/08/2020 12:22 PM   Specimen: BLOOD  Result Value Ref Range Status   Specimen Description BLOOD LEFT ANTECUBITAL  Final   Special Requests   Final    BOTTLES DRAWN AEROBIC AND ANAEROBIC Blood Culture adequate volume   Culture   Final    NO GROWTH <12 HOURS Performed at [facility], [place] [address], [place], [place] [address]    Report Status PENDING  Incomplete  Resp Panel by RT-PCR (Flu A&B, Covid) Nasopharyngeal Swab     Status: None   Collection Time: 08/14/2020 12:23 PM   Specimen: Nasopharyngeal Swab; Nasopharyngeal(NP) swabs in vial transport medium  Result Value Ref Range Status   SARS Coronavirus 2 by RT PCR NEGATIVE NEGATIVE Final     Comment: (NOTE) SARS-CoV-2 target nucleic acids are NOT DETECTED.  The SARS-CoV-2 RNA is generally detectable in upper respiratory specimens during the acute phase of infection. The lowest concentration of SARS-CoV-2 viral copies this assay can detect is 138 copies/mL. A negative result does not preclude SARS-Cov-2 infection and should not be used as  the sole basis for treatment or other patient management decisions. A negative result may occur with  improper specimen collection/handling, submission of specimen other than nasopharyngeal swab, presence of viral mutation(s) within the areas targeted by this assay, and inadequate number of viral copies(<138 copies/mL). A negative result must be combined with clinical observations, patient history, and epidemiological information. The expected result is Negative.  Fact Sheet for Patients:  EntrepreneurPulse.com.au  Fact Sheet for Healthcare Providers:  IncredibleEmployment.be  This test is no t yet approved or cleared by the [place] FDA and  has been authorized for detection and/or diagnosis of SARS-CoV-2 by FDA under an Emergency Use Authorization (EUA). This EUA will remain  in effect (meaning this test can be used) for the duration of the COVID-19 declaration under Section 564(b)(1) of the Act, 21 U.S.C.section 360bbb-3(b)(1), unless the authorization is terminated  or revoked sooner.       Influenza A by PCR NEGATIVE NEGATIVE Final   Influenza B by PCR NEGATIVE NEGATIVE Final    Comment: (NOTE) The Xpert Xpress SARS-CoV-2/FLU/RSV plus assay is intended as an aid in the diagnosis of influenza from Nasopharyngeal swab specimens and should not be used as a sole basis for treatment. Nasal washings and aspirates are unacceptable for Xpert Xpress SARS-CoV-2/FLU/RSV testing.  Fact Sheet for Patients: EntrepreneurPulse.com.au  Fact Sheet for Healthcare  Providers: IncredibleEmployment.be  This test is not yet approved or cleared by the [place] FDA and has been authorized for detection and/or diagnosis of SARS-CoV-2 by FDA under an Emergency Use Authorization (EUA). This EUA will remain in effect (meaning this test can be used) for the duration of the COVID-19 declaration under Section 564(b)(1) of the Act, 21 U.S.C. section 360bbb-3(b)(1), unless the authorization is terminated or revoked.  Performed at [facility], [place] [address] [place], [place] [address]      Labs: Basic Metabolic Panel: Recent Labs  Lab 08/14/2020 1222  NA 139  K 5.3*  CL 100  CO2 26  GLUCOSE 185*  BUN 57*  CREATININE 1.38*  CALCIUM 8.5*   Liver Function Tests: Recent Labs  Lab 07/26/2020 1222  AST 20  ALT 13  ALKPHOS 39  BILITOT 1.4*  PROT 5.9*  ALBUMIN 2.8*   No results for input(s): LIPASE, AMYLASE in the last 168 hours. No results for input(s): AMMONIA in the last 168 hours. CBC: Recent Labs  Lab 08/14/2020 1222  WBC 10.1  NEUTROABS 9.2*  HGB 13.1  HCT 40.2  MCV 92.0  PLT 160   Cardiac Enzymes: No results for input(s): CKTOTAL, CKMB, CKMBINDEX, TROPONINI in the last 168 hours. D-Dimer Recent Labs    08/07/2020 1222  DDIMER 1.55*   BNP: Invalid input(s): POCBNP CBG: No results for input(s): GLUCAP in the last 168 hours. Anemia work up Recent Labs    07/26/2020 1245  FERRITIN 445*   Urinalysis    Component Value Date/Time   COLORURINE YELLOW 07/09/2020 [place] 07/09/2020 1558   LABSPEC 1.015 07/09/2020 1558   PHURINE 5.0 07/09/2020 1558   GLUCOSEU NEGATIVE 07/09/2020 1558   HGBUR NEGATIVE 07/09/2020 1558   BILIRUBINUR NEGATIVE 07/09/2020 1558   KETONESUR 5 (A) 07/09/2020 1558   PROTEINUR NEGATIVE 07/09/2020 1558   NITRITE NEGATIVE 07/09/2020 1558   LEUKOCYTESUR NEGATIVE 07/09/2020 1558   Sepsis Labs Invalid input(s): PROCALCITONIN,  WBC,   LACTICIDVEN     SIGNED:  Norval Morton, MD  [facility] 08-14-20, 7:30 AM Pager   If 7PM-7AM, please  contact night-coverage [email or web address] Password [email or web address]

## 2020-08-17 NOTE — Progress Notes (Addendum)
Resting comfortable. O2 has been titrated to 4L [place]. Wife and son at bedside. [id number]. No s/s of discomfort or pain. Morphine gtt was increased to 3 mg/hr 08/01/2020 at 2100 due to tachypnea at that time. Continuing to reposition for comfort. Partial bed bath completed and clean pads placed. Incontinent of urine. Mumbles and moans in response to pain or verbal stimuli. No ativan needed at this time. He is comfortably resting calmly and no visual sign of respiratory or pain distress. Family remains at bedside.

## 2020-08-17 NOTE — Progress Notes (Addendum)
Time of death pronounced by 2 RNs, this nurse and Ubaldo Glassing, Therapist, sports. Wife and son, Lennette Bihari at bedside. Notified MD. Family is awaiting his daughter's arrival.

## 2020-08-17 NOTE — Progress Notes (Signed)
Resting comfortably. No s/s of any respiratory distress or pain. Morphine gtt continues at 3mg /hr and has been effective at this level for comfort for him. He is lying in bed w/ eyes closed and  respirations 18. Son, Lennette Bihari and his wife are at bedside. Continuing to monitor for pain and comfort.

## 2020-08-17 NOTE — Progress Notes (Signed)
Wasted 68mL of IV morphine, witnessed by Court Joy RN

## 2020-08-17 DEATH — deceased

## 2020-12-23 ENCOUNTER — Ambulatory Visit: Payer: HMO | Admitting: Neurology

## 2021-01-10 ENCOUNTER — Ambulatory Visit: Payer: HMO | Admitting: Neurology

## 2022-04-20 IMAGING — DX DG CHEST 1V PORT
2 series · 2 of 2 positions shown · non-contrast
Comparison: 10/17/2009

CLINICAL DATA: Slurred speech

EXAM:
PORTABLE CHEST 1 VIEW

[chest ap (1 of 2)]
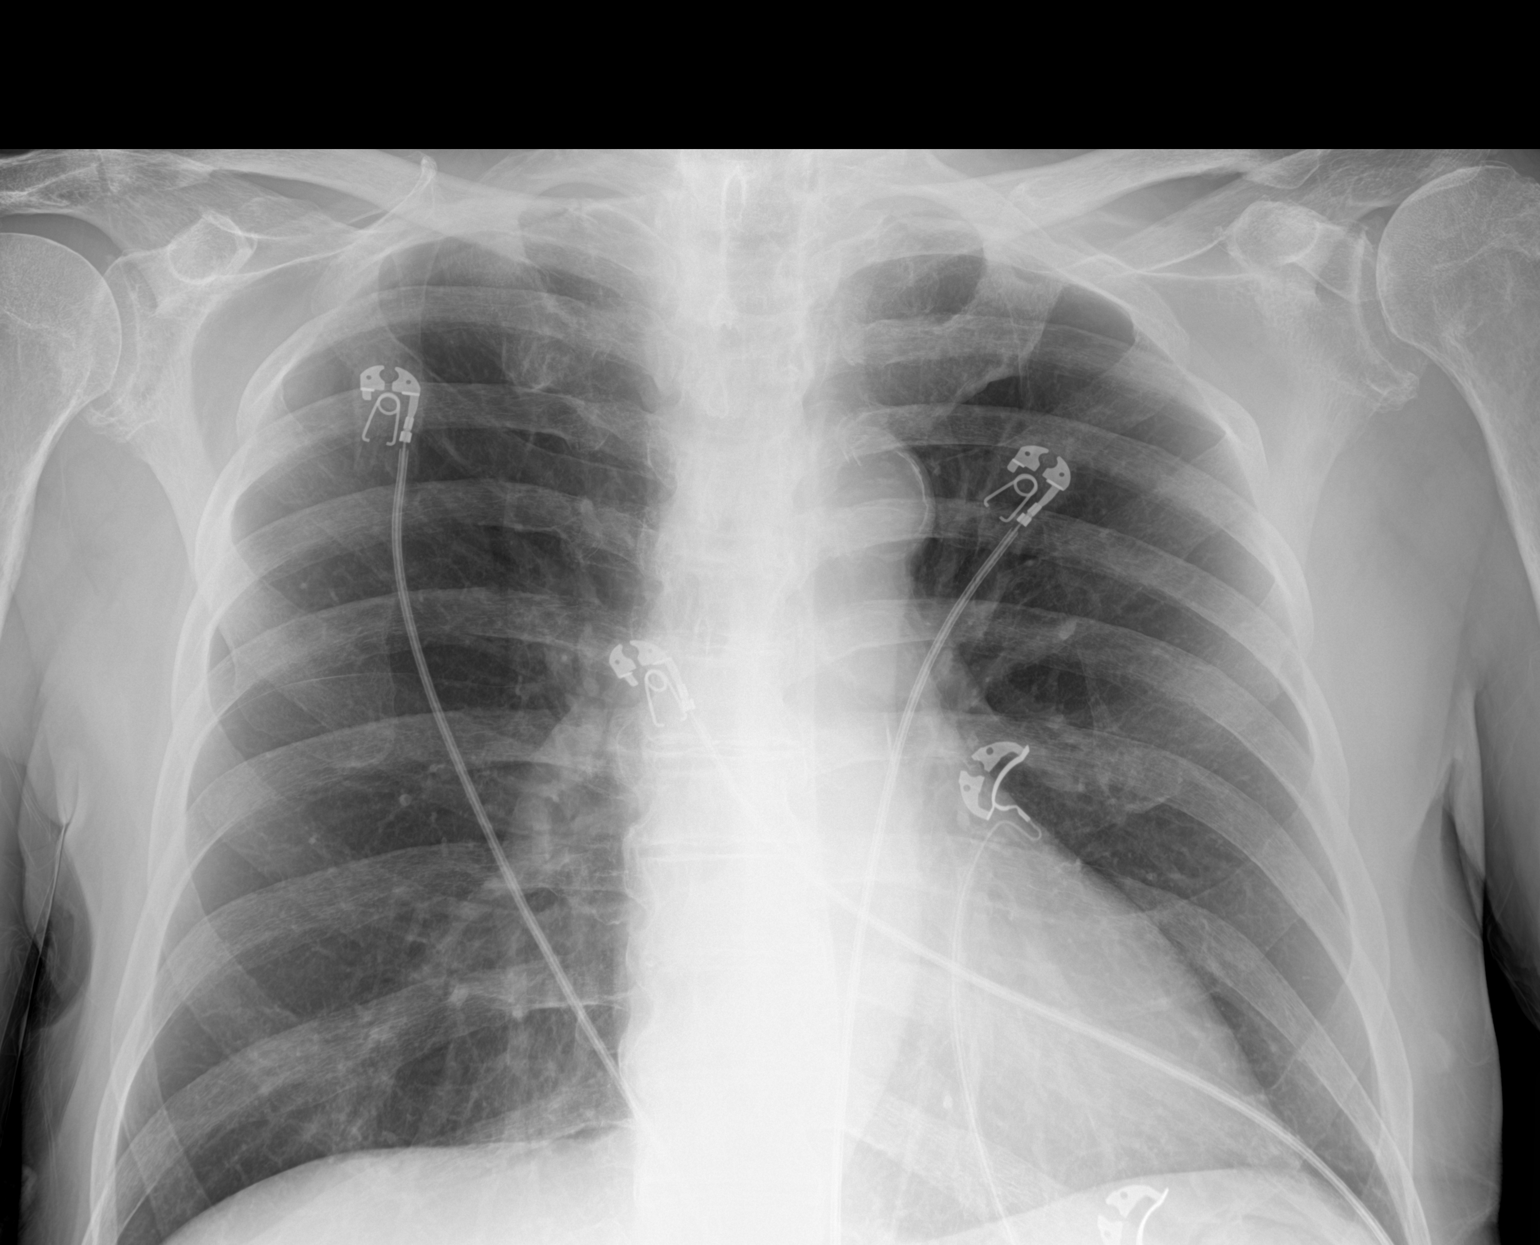

[chest ap (2 of 2)]
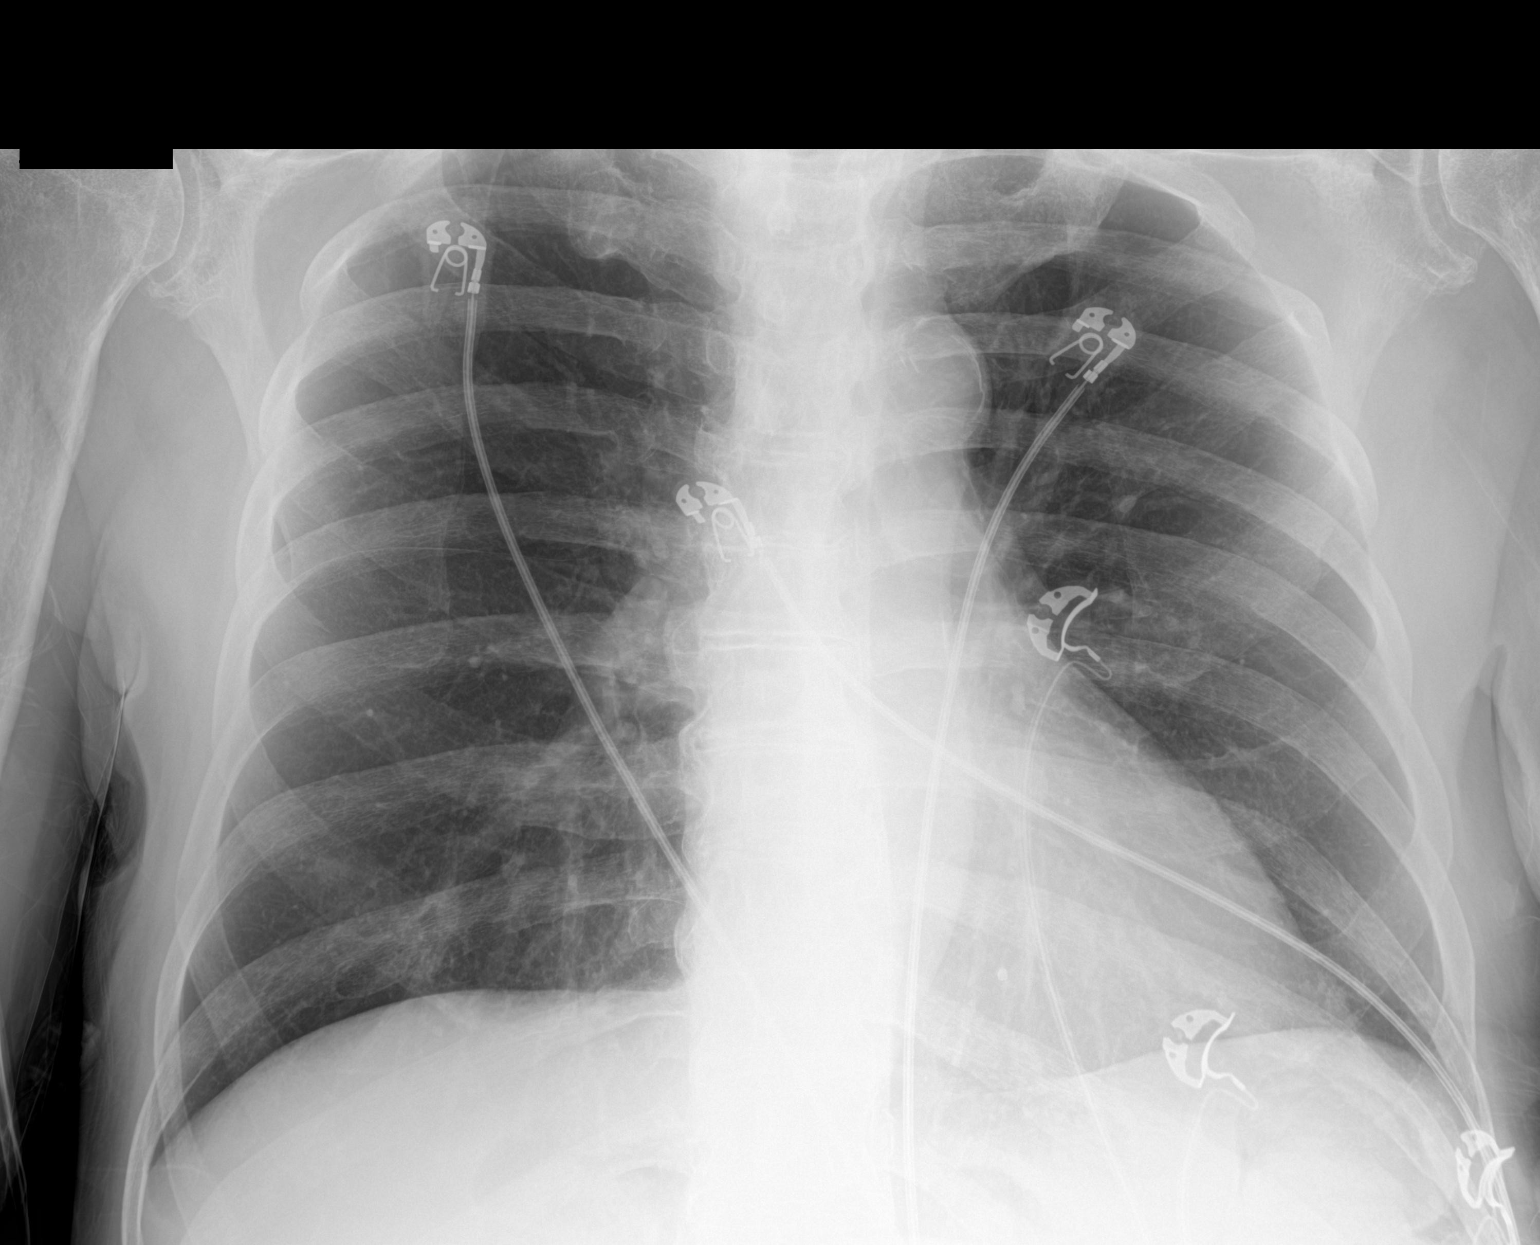

[2 of 2 positions shown; findings below may reference images not displayed]

FINDINGS: The heart size and mediastinal contours are within normal limits.
Both lungs are clear. The visualized skeletal structures are
unremarkable.
IMPRESSION: No active disease.

## 2022-04-20 IMAGING — MR MR HEAD W/O CM
10 series · 41 of 48 positions shown · non-contrast
Comparison: 07/09/2020 and prior.

CLINICAL DATA: Slurred speech, prior fall.

EXAM:
MRI HEAD WITHOUT CONTRAST
TECHNIQUE: Multiplanar, multiecho pulse sequences of the brain and surrounding
structures were obtained without intravenous contrast.

[Series 5: dwi_tracew · axial · 3.0mm · 1.08mm/px · z∈[-66,+105]mm · 8 of 116 slices shown]
[im 1/116]
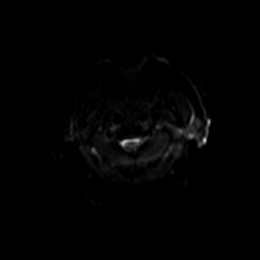
[im 21/116]
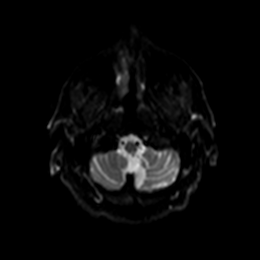
[im 32/116]
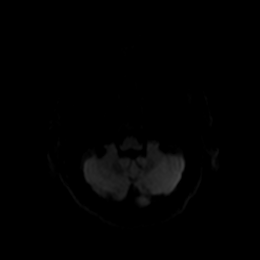
[im 53/116]
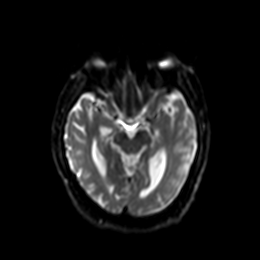
[im 63/116]
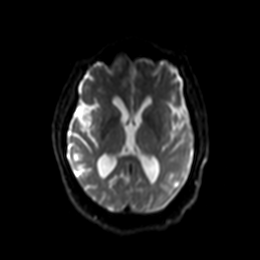
[im 84/116]
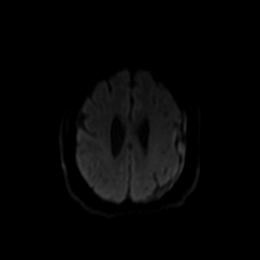
[im 95/116]
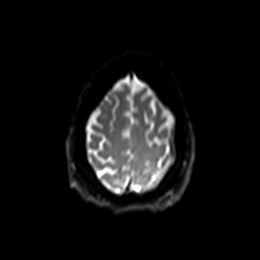
[im 116/116]
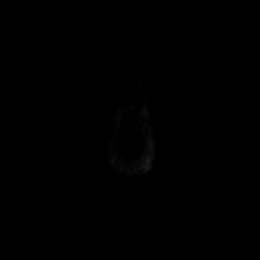

[Series 6: dwi_adc · axial · 3.0mm · 1.08mm/px · z∈[-66,-24]mm · 2 of 58 slices shown]
[im 1/58]
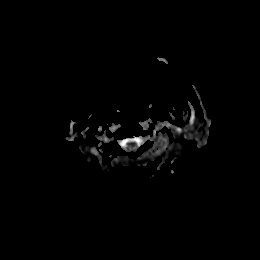
[im 15/58]
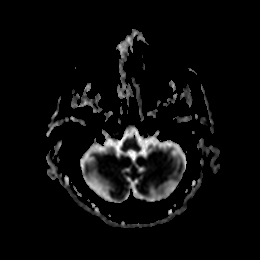

[Series 7: T2 · sagittal · 5.0mm · 0.47mm/px · 2 of 27 slices shown (1 of 3)]
[im 1/27]
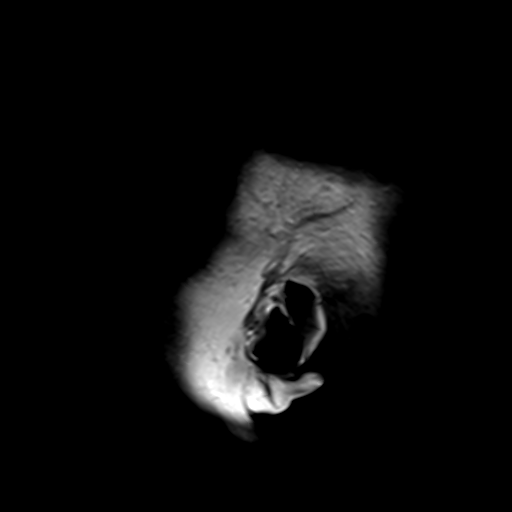
[im 27/27]
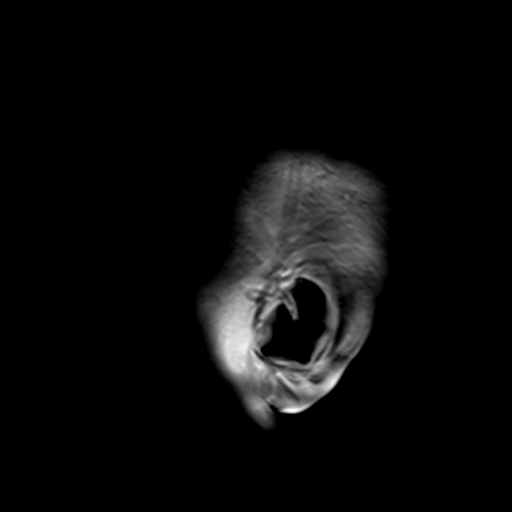

[Series 8: T2 · axial · 5.0mm · 0.45mm/px · z∈[-70,+106]mm · 3 of 28 slices shown (2 of 3)]
[im 1/28]
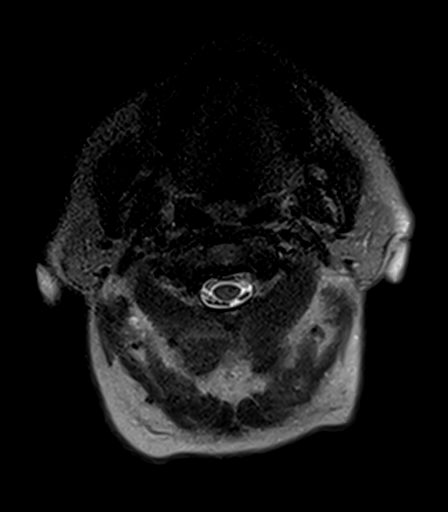
[im 14/28]
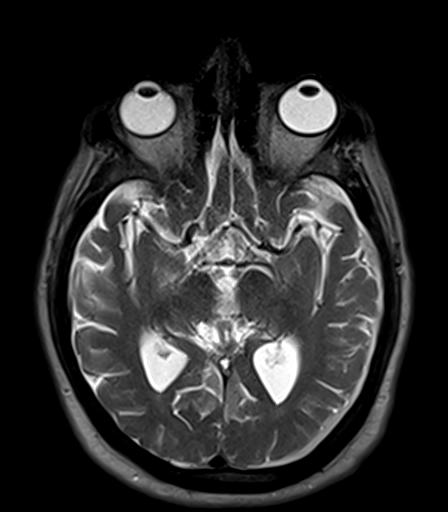
[im 28/28]
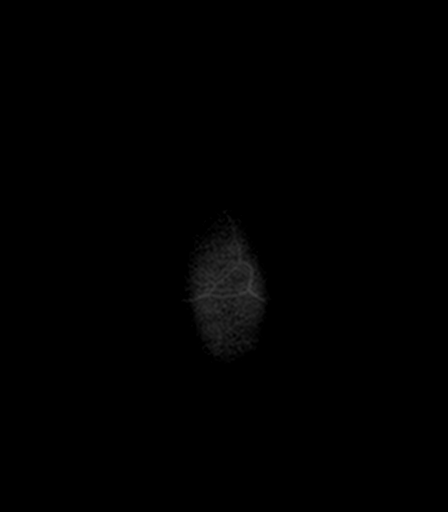

[Series 9: GRE · axial · 3.0mm · 0.45mm/px · z∈[-71,+106]mm · 5 of 60 slices shown]
[im 1/60]
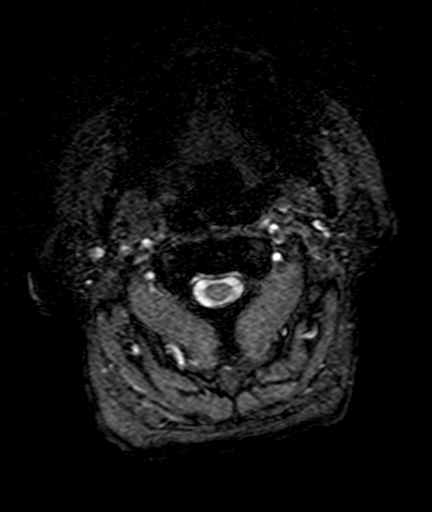
[im 15/60]
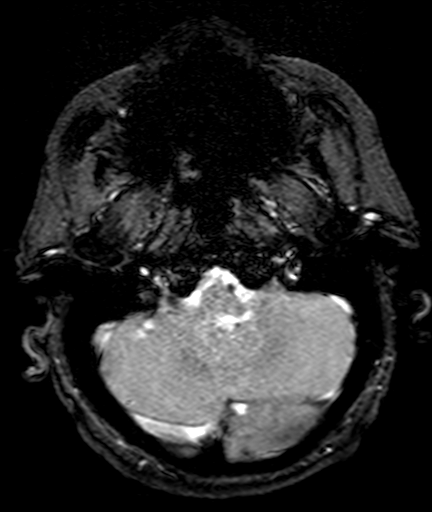
[im 30/60]
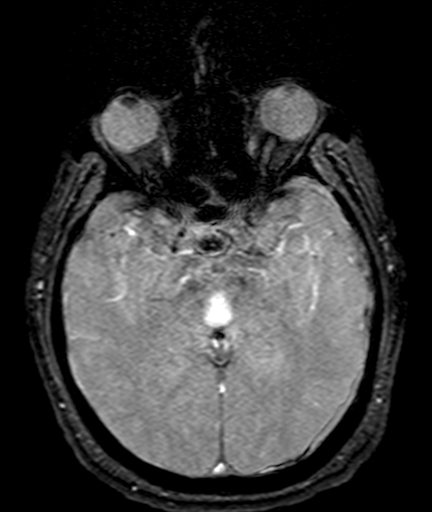
[im 45/60]
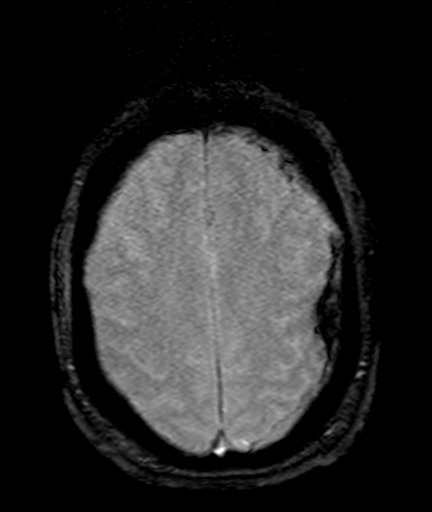
[im 60/60]
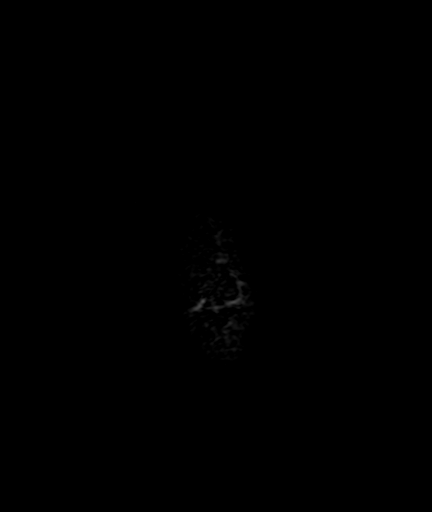

[Series 10: FLAIR · axial · 3.0mm · 0.86mm/px · z∈[-71,+106]mm · 5 of 60 slices shown]
[im 1/60]
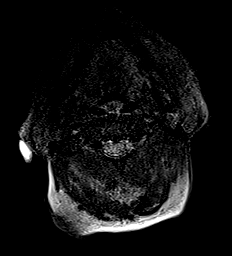
[im 15/60]
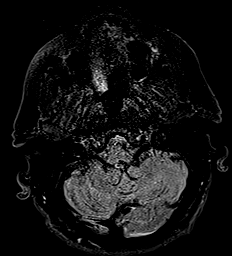
[im 30/60]
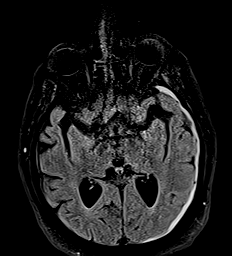
[im 45/60]
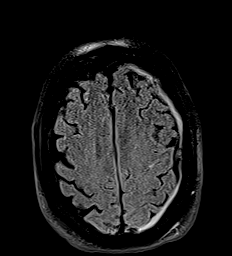
[im 60/60]
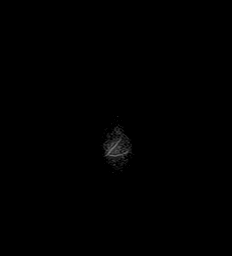

[Series 11: DWI · coronal · 5.0mm · 1.31mm/px · 5 of 60 slices shown (1 of 2)]
[im 1/60]
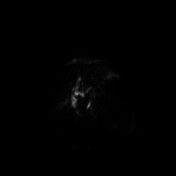
[im 15/60]
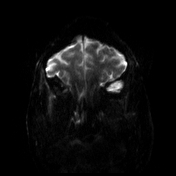
[im 30/60]
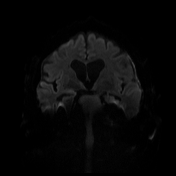
[im 45/60]
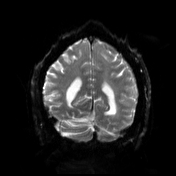
[im 60/60]
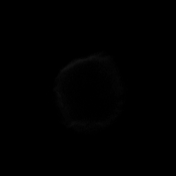

[Series 12: DWI · coronal · 5.0mm · 1.31mm/px · 3 of 30 slices shown (2 of 2)]
[im 1/30]
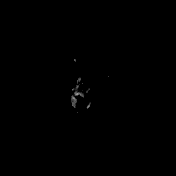
[im 15/30]
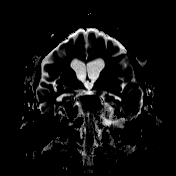
[im 30/30]
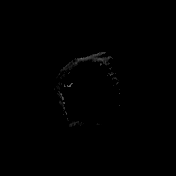

[Series 13: T1 · axial · 3.0mm · 0.45mm/px · z∈[-71,+106]mm · 5 of 60 slices shown]
[im 1/60]
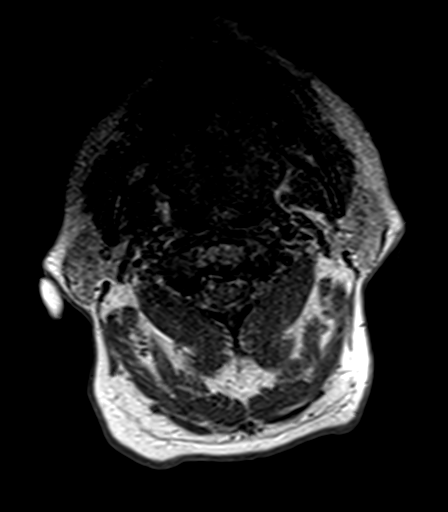
[im 15/60]
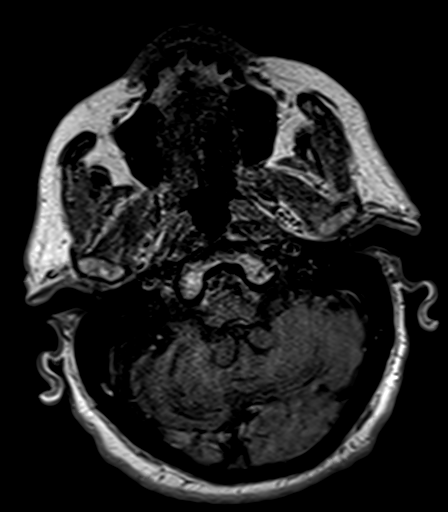
[im 30/60]
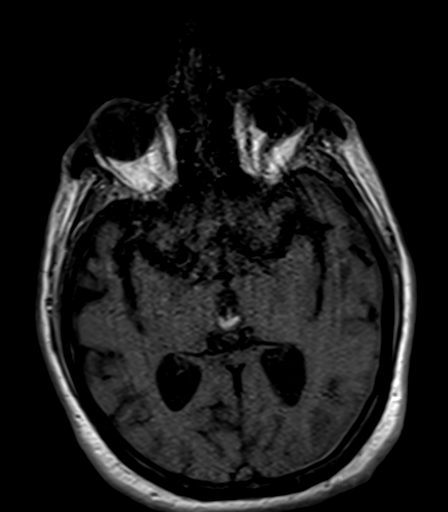
[im 45/60]
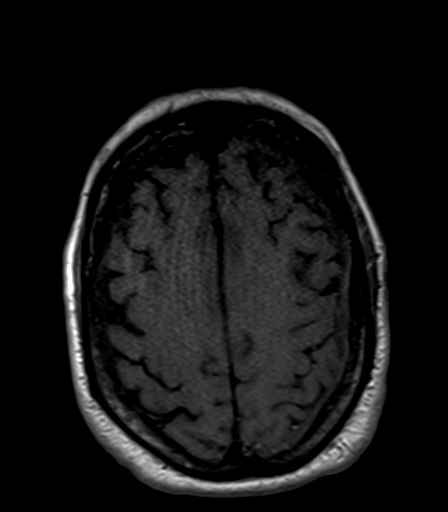
[im 60/60]
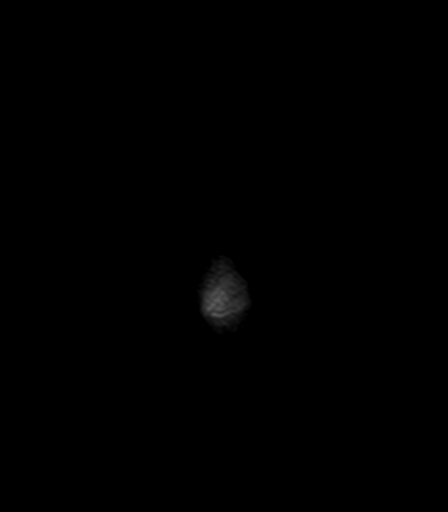

[Series 14: T2 · coronal · 5.0mm · 0.86mm/px · 3 of 30 slices shown (3 of 3)]
[im 1/30]
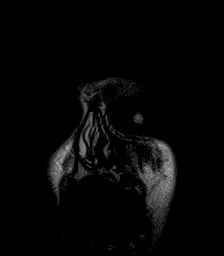
[im 15/30]
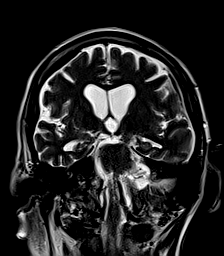
[im 30/30]
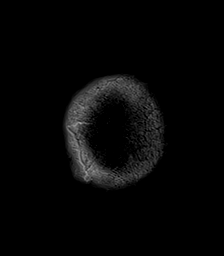

[41 of 48 positions shown; findings below may reference images not displayed]

FINDINGS: Brain: No acute infarct. Acute left cerebral convexity subdural
hematoma measuring up to 1.0 cm is unchanged. No significant mass
effect or parenchymal edema. No midline shift or ventriculomegaly.
Mild cerebral atrophy with ex vacuo dilatation. Tiny remote right
cerebellar lacunar insult.

Vascular: Normal flow voids.

Skull and upper cervical spine: Normal marrow signal.

Sinuses/Orbits: No acute orbital finding. Pneumatized paranasal
sinuses. Trace left mastoid free fluid.

Other: None.
IMPRESSION: Unchanged acute left cerebral convexity subdural hematoma measuring
1.0 cm.

Mild cerebral atrophy.  Tiny remote right cerebellar lacunar insult.

## 2022-04-20 IMAGING — CT CT CERVICAL SPINE W/O CM
3 of 4 series · 12 of 33 positions shown, 14 images · non-contrast
Comparison: CT brain and cervical spine 07/14/2017, MRI 07/15/2017

CLINICAL DATA: Slurred speech

EXAM:
CT HEAD WITHOUT CONTRAST
CT CERVICAL SPINE WITHOUT CONTRAST
TECHNIQUE: Multidetector CT imaging of the head and cervical spine was
performed following the standard protocol without intravenous
contrast. Multiplanar CT image reconstructions of the cervical spine
were also generated.

[Series 6: orthogonal bone · axial · 0.28mm/px · z∈[-444,-332]mm · 4 of 103 slices shown, 5 images]
[im 18/103  soft-tissue]
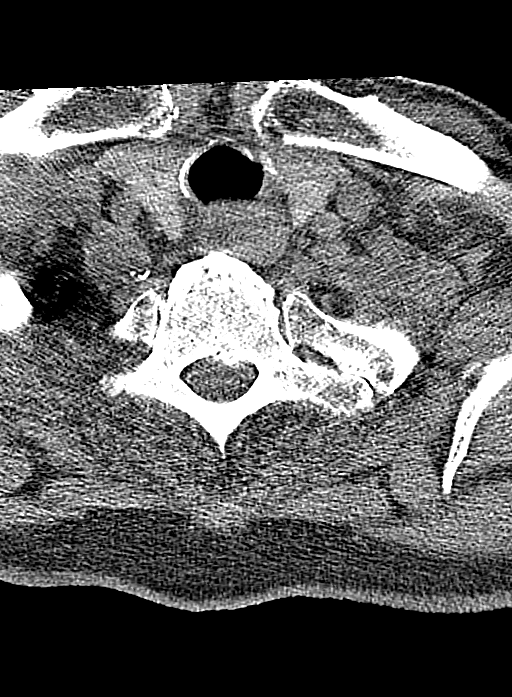
[im 18/103  bone]
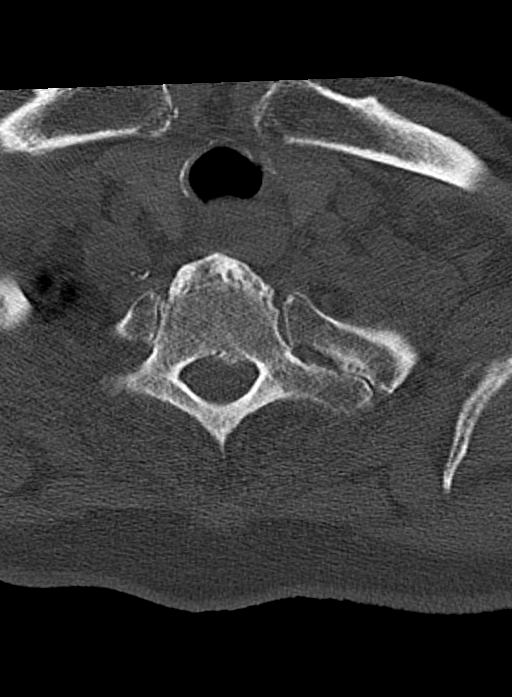
[im 35/103  bone]
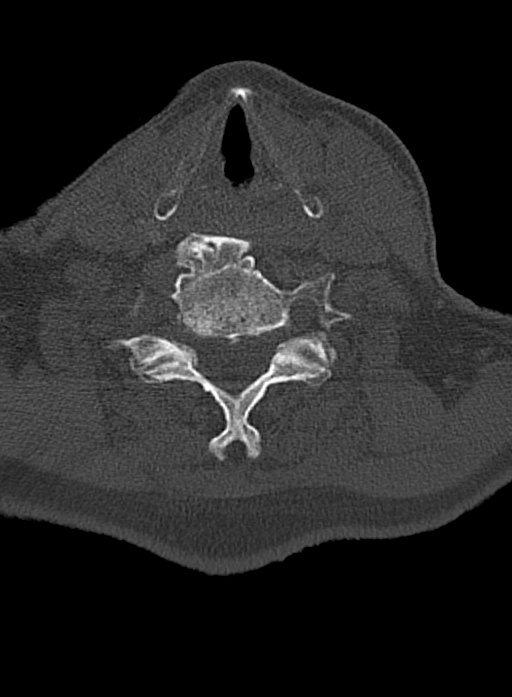
[im 69/103  bone]
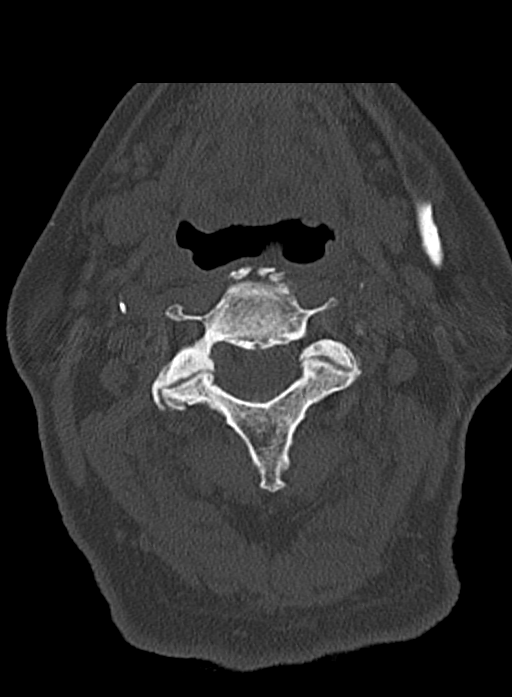
[im 86/103  bone]
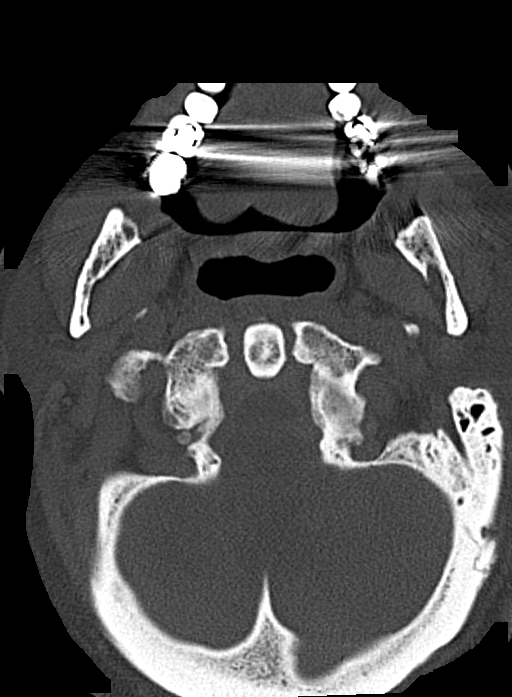

[Series 7: coronal bone · coronal · 0.30mm/px · 3 of 78 slices shown]
[im 23/78  bone]
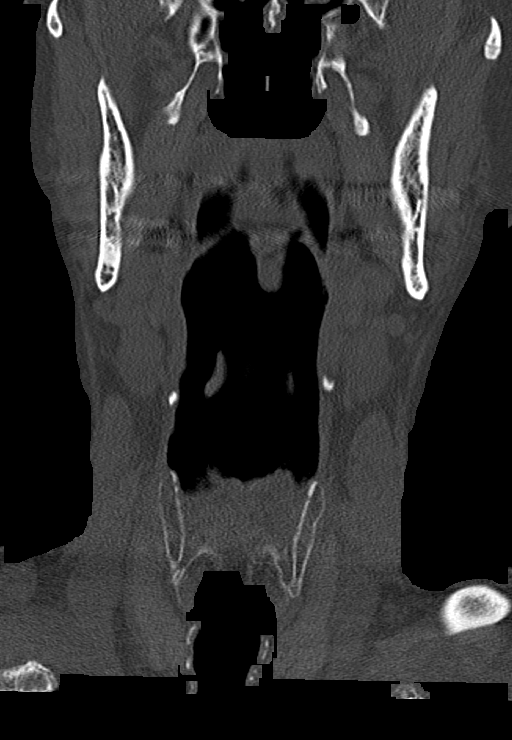
[im 34/78  bone]
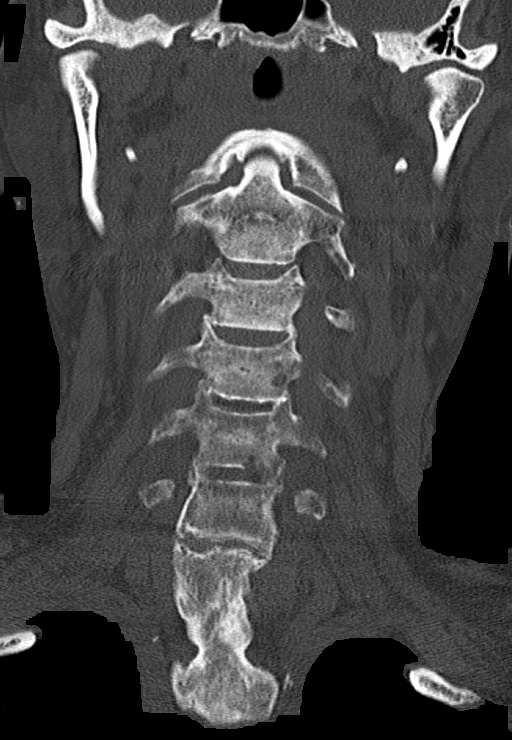
[im 45/78  bone]
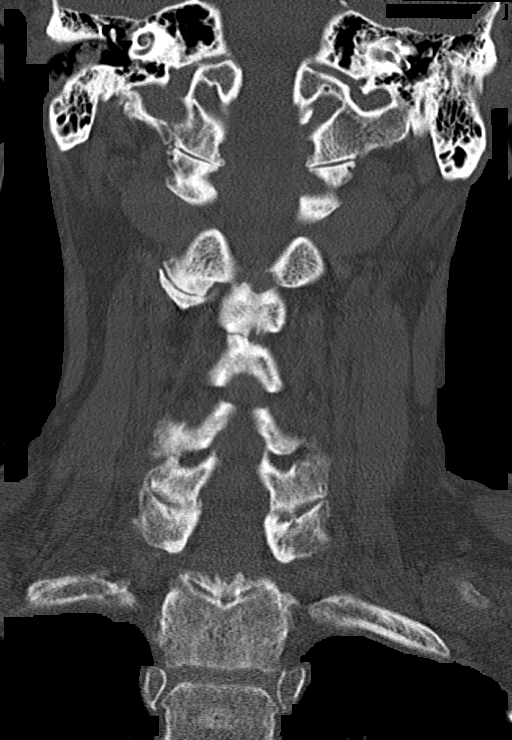

[Series 8: sagittal bone · sagittal · 0.36mm/px · 5 of 61 slices shown, 6 images]
[im 21/61  bone]
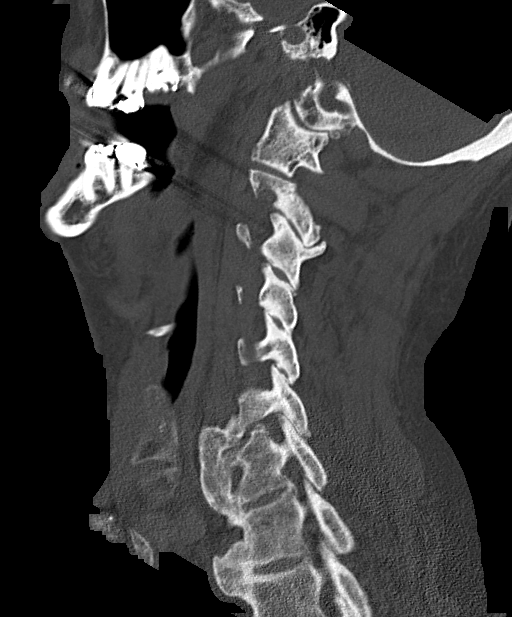
[im 26/61  bone]
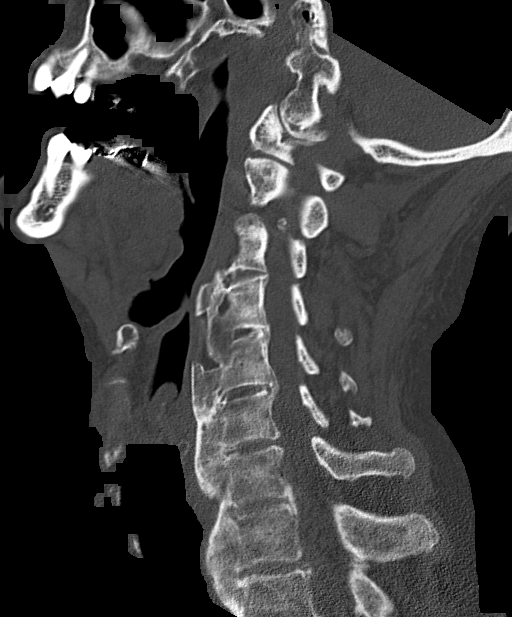
[im 31/61  soft-tissue]
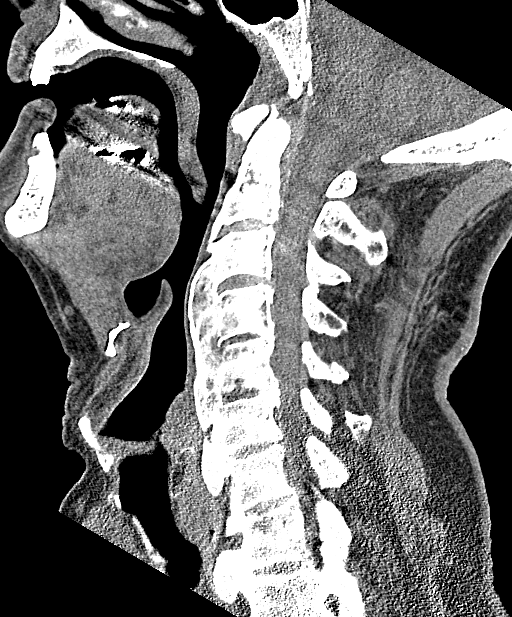
[im 31/61  bone]
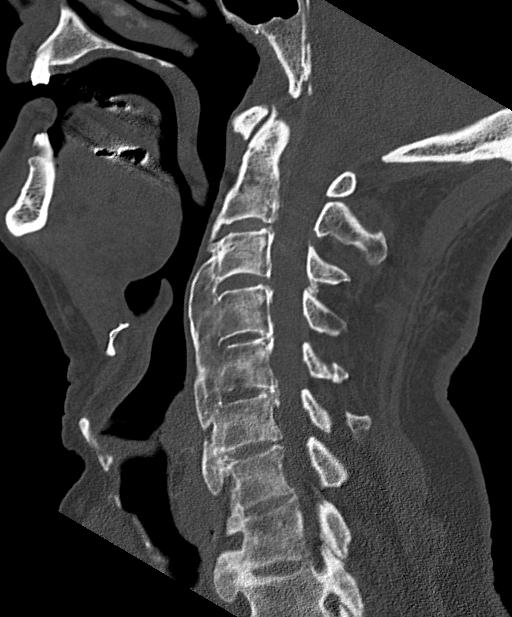
[im 36/61  bone]
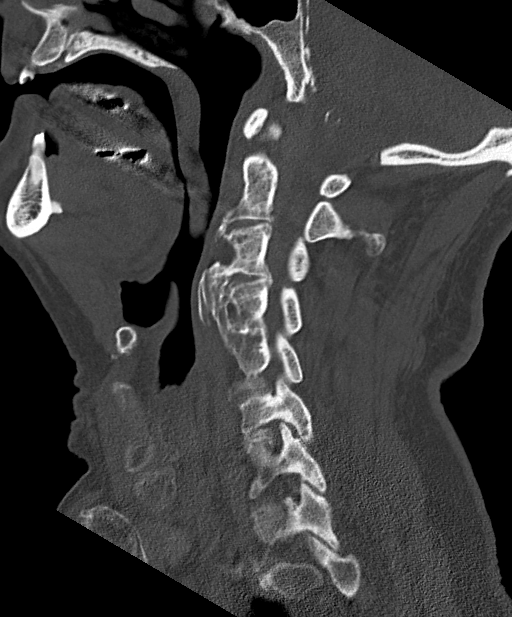
[im 41/61  bone]
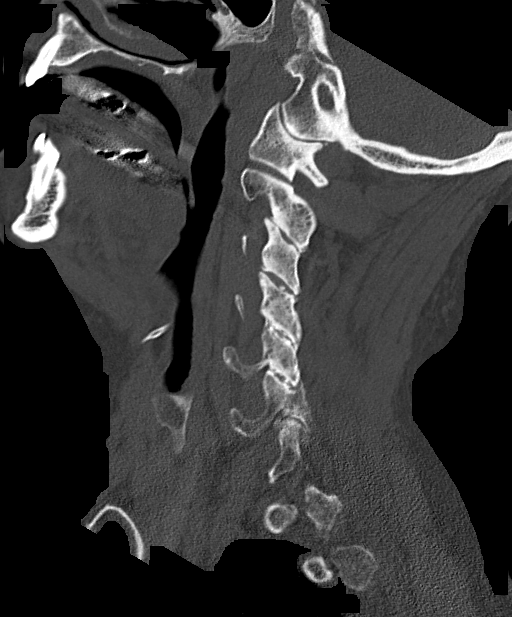

[12 of 33 positions shown; findings below may reference images not displayed]

FINDINGS: CT HEAD FINDINGS

Brain: No acute territorial infarction or intracranial mass is
visualized. Acute left convexity subdural hematoma measuring up to
10 mm maximum thickness. No significant midline shift. Mild atrophy.
Stable ventricle size.

Vascular: No hyperdense vessels. Vertebral and carotid vascular
calcification

Skull: Normal. Negative for fracture or focal lesion.

Sinuses/Orbits: No acute finding.

Other: None

CT CERVICAL SPINE FINDINGS

Alignment: Straightening of the cervical spine.  No subluxation.

Skull base and vertebrae: No acute fracture. No primary bone lesion
or focal pathologic process.

Soft tissues and spinal canal: No prevertebral fluid or swelling. No
visible canal hematoma.

Disc levels: Bulky flowing anterior osteophytes C3 through T1 with
ankylosis, consistent with dish. Mild disc space narrowing at C2-C3,
C4-C5, C5-C6 and C6-C7. Facet degenerative changes at multiple
levels.

Upper chest: Minimal apical scarring.

Other: None
IMPRESSION: 1. Acute left convexity subdural hematoma measuring up to 10 mm
maximum thickness. No significant midline shift.
2. Straightening of the cervical spine with diffuse idiopathic
skeletal hyperostosis type changes C3 through T1. No acute osseous
abnormality.

Critical Value/emergent results were called by telephone at the time
of interpretation on 07/09/2020 at [DATE] to provider MAHALIA YONG ,
who verbally acknowledged these results.
